# Patient Record
Sex: Female | Born: 1965 | Race: White | Hispanic: No | Smoking: Never smoker
Health system: Southern US, Community
[De-identification: ages and names within clinical notes are randomized; demographics above are authoritative.]

## PROBLEM LIST (undated history)

## (undated) DIAGNOSIS — K219 Gastro-esophageal reflux disease without esophagitis: Secondary | ICD-10-CM

## (undated) DIAGNOSIS — O24419 Gestational diabetes mellitus in pregnancy, unspecified control: Secondary | ICD-10-CM

## (undated) DIAGNOSIS — B009 Herpesviral infection, unspecified: Secondary | ICD-10-CM

## (undated) DIAGNOSIS — F329 Major depressive disorder, single episode, unspecified: Secondary | ICD-10-CM

## (undated) DIAGNOSIS — N200 Calculus of kidney: Secondary | ICD-10-CM

## (undated) DIAGNOSIS — F419 Anxiety disorder, unspecified: Secondary | ICD-10-CM

## (undated) DIAGNOSIS — F32A Depression, unspecified: Secondary | ICD-10-CM

## (undated) DIAGNOSIS — Z8619 Personal history of other infectious and parasitic diseases: Secondary | ICD-10-CM

## (undated) DIAGNOSIS — I2489 Other forms of acute ischemic heart disease: Secondary | ICD-10-CM

## (undated) DIAGNOSIS — I10 Essential (primary) hypertension: Secondary | ICD-10-CM

## (undated) DIAGNOSIS — F1490 Cocaine use, unspecified, uncomplicated: Secondary | ICD-10-CM

## (undated) DIAGNOSIS — E538 Deficiency of other specified B group vitamins: Secondary | ICD-10-CM

## (undated) DIAGNOSIS — Z8659 Personal history of other mental and behavioral disorders: Secondary | ICD-10-CM

## (undated) DIAGNOSIS — Z87442 Personal history of urinary calculi: Secondary | ICD-10-CM

## (undated) DIAGNOSIS — E785 Hyperlipidemia, unspecified: Secondary | ICD-10-CM

## (undated) DIAGNOSIS — F1010 Alcohol abuse, uncomplicated: Secondary | ICD-10-CM

## (undated) DIAGNOSIS — Z860100 Personal history of colon polyps, unspecified: Secondary | ICD-10-CM

## (undated) DIAGNOSIS — D649 Anemia, unspecified: Secondary | ICD-10-CM

## (undated) DIAGNOSIS — Z87898 Personal history of other specified conditions: Secondary | ICD-10-CM

## (undated) DIAGNOSIS — F1011 Alcohol abuse, in remission: Secondary | ICD-10-CM

## (undated) DIAGNOSIS — Z8489 Family history of other specified conditions: Secondary | ICD-10-CM

## (undated) DIAGNOSIS — R7989 Other specified abnormal findings of blood chemistry: Secondary | ICD-10-CM

## (undated) HISTORY — DX: Personal history of other specified conditions: Z87.898

## (undated) HISTORY — DX: Personal history of colonic polyps: Z86.010

## (undated) HISTORY — DX: Calculus of kidney: N20.0

## (undated) HISTORY — PX: DILATION AND CURETTAGE OF UTERUS: SHX78

## (undated) HISTORY — DX: Depression, unspecified: F32.A

## (undated) HISTORY — DX: Major depressive disorder, single episode, unspecified: F32.9

## (undated) HISTORY — DX: Alcohol abuse, in remission: F10.11

## (undated) HISTORY — DX: Essential (primary) hypertension: I10

## (undated) HISTORY — DX: Herpesviral infection, unspecified: B00.9

## (undated) HISTORY — DX: Hyperlipidemia, unspecified: E78.5

## (undated) HISTORY — DX: Personal history of other infectious and parasitic diseases: Z86.19

## (undated) HISTORY — DX: Other forms of acute ischemic heart disease: I24.89

## (undated) HISTORY — DX: Personal history of colon polyps, unspecified: Z86.0100

## (undated) HISTORY — DX: Personal history of other mental and behavioral disorders: Z86.59

---

## 1995-11-19 HISTORY — PX: TONSILLECTOMY: SUR1361

## 1995-11-19 HISTORY — PX: APPENDECTOMY: SHX54

## 1996-11-18 HISTORY — PX: ABDOMINAL HYSTERECTOMY: SHX81

## 2008-05-13 ENCOUNTER — Emergency Department: Payer: Self-pay | Admitting: Emergency Medicine

## 2009-05-01 ENCOUNTER — Emergency Department: Payer: Self-pay | Admitting: Emergency Medicine

## 2010-01-21 ENCOUNTER — Inpatient Hospital Stay: Payer: Self-pay | Admitting: Internal Medicine

## 2010-03-03 ENCOUNTER — Inpatient Hospital Stay: Payer: Self-pay | Admitting: Psychiatry

## 2010-04-11 ENCOUNTER — Inpatient Hospital Stay: Payer: Self-pay | Admitting: Internal Medicine

## 2010-05-25 ENCOUNTER — Inpatient Hospital Stay: Payer: Self-pay | Admitting: Psychiatry

## 2010-07-06 ENCOUNTER — Inpatient Hospital Stay: Payer: Self-pay | Admitting: Psychiatry

## 2010-08-21 IMAGING — CR RIGHT HAND - COMPLETE 3+ VIEW
1 series · 3 of 3 positions shown · non-contrast
Comparison: none

REASON FOR EXAM: PAINFUL AND SWOLLEN
COMMENTS:

[Series 1: view not recorded · 0.17mm/px · 3 of 3 slices shown]
[im 1/3]
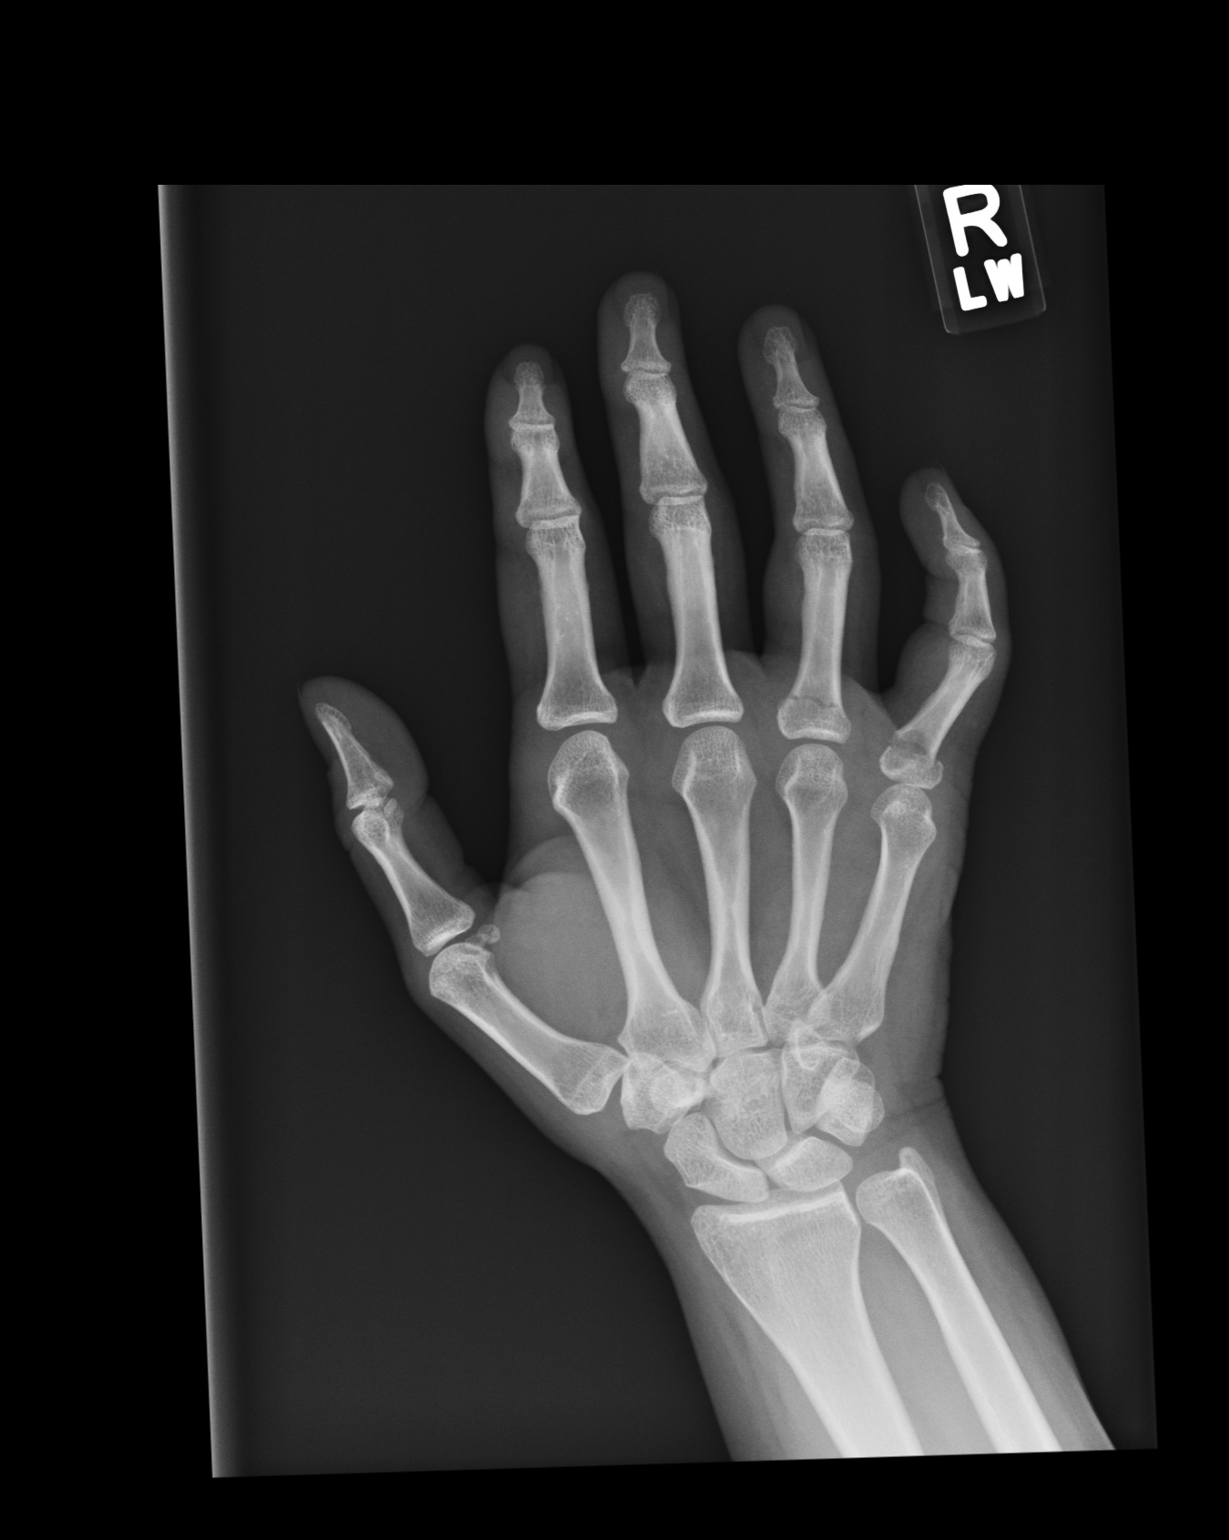
[im 2/3]
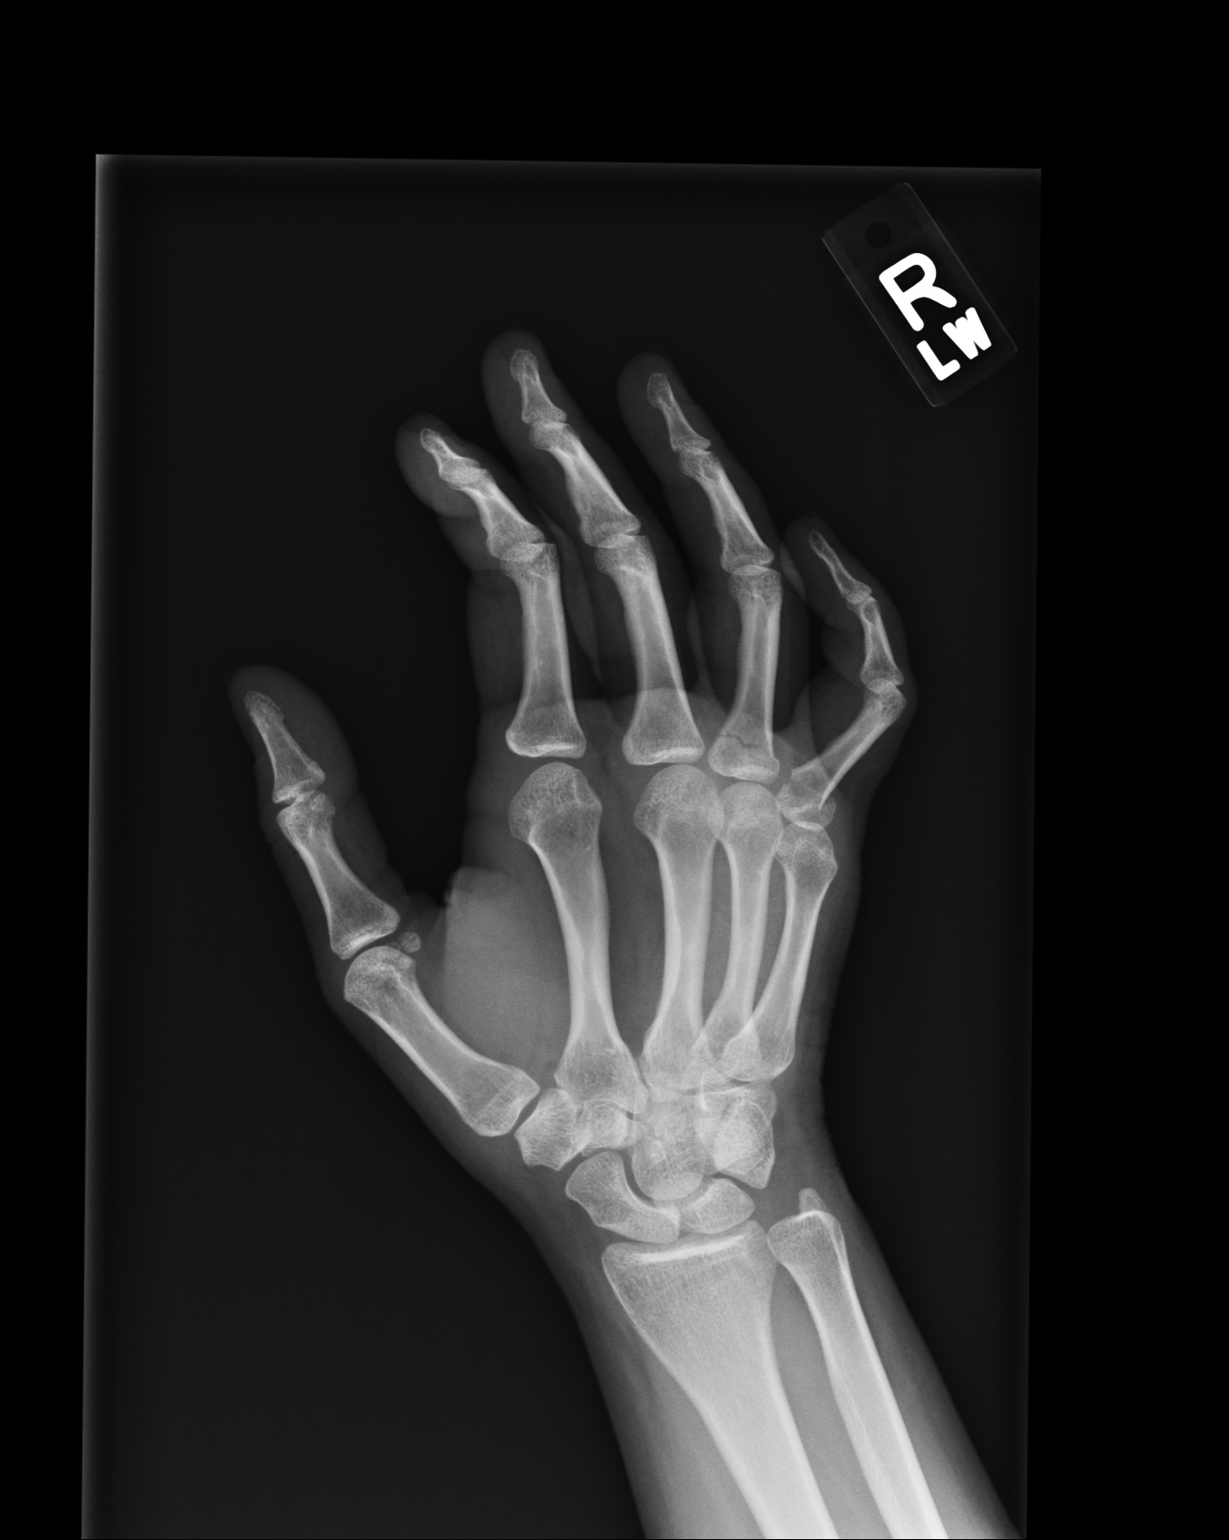
[im 3/3]
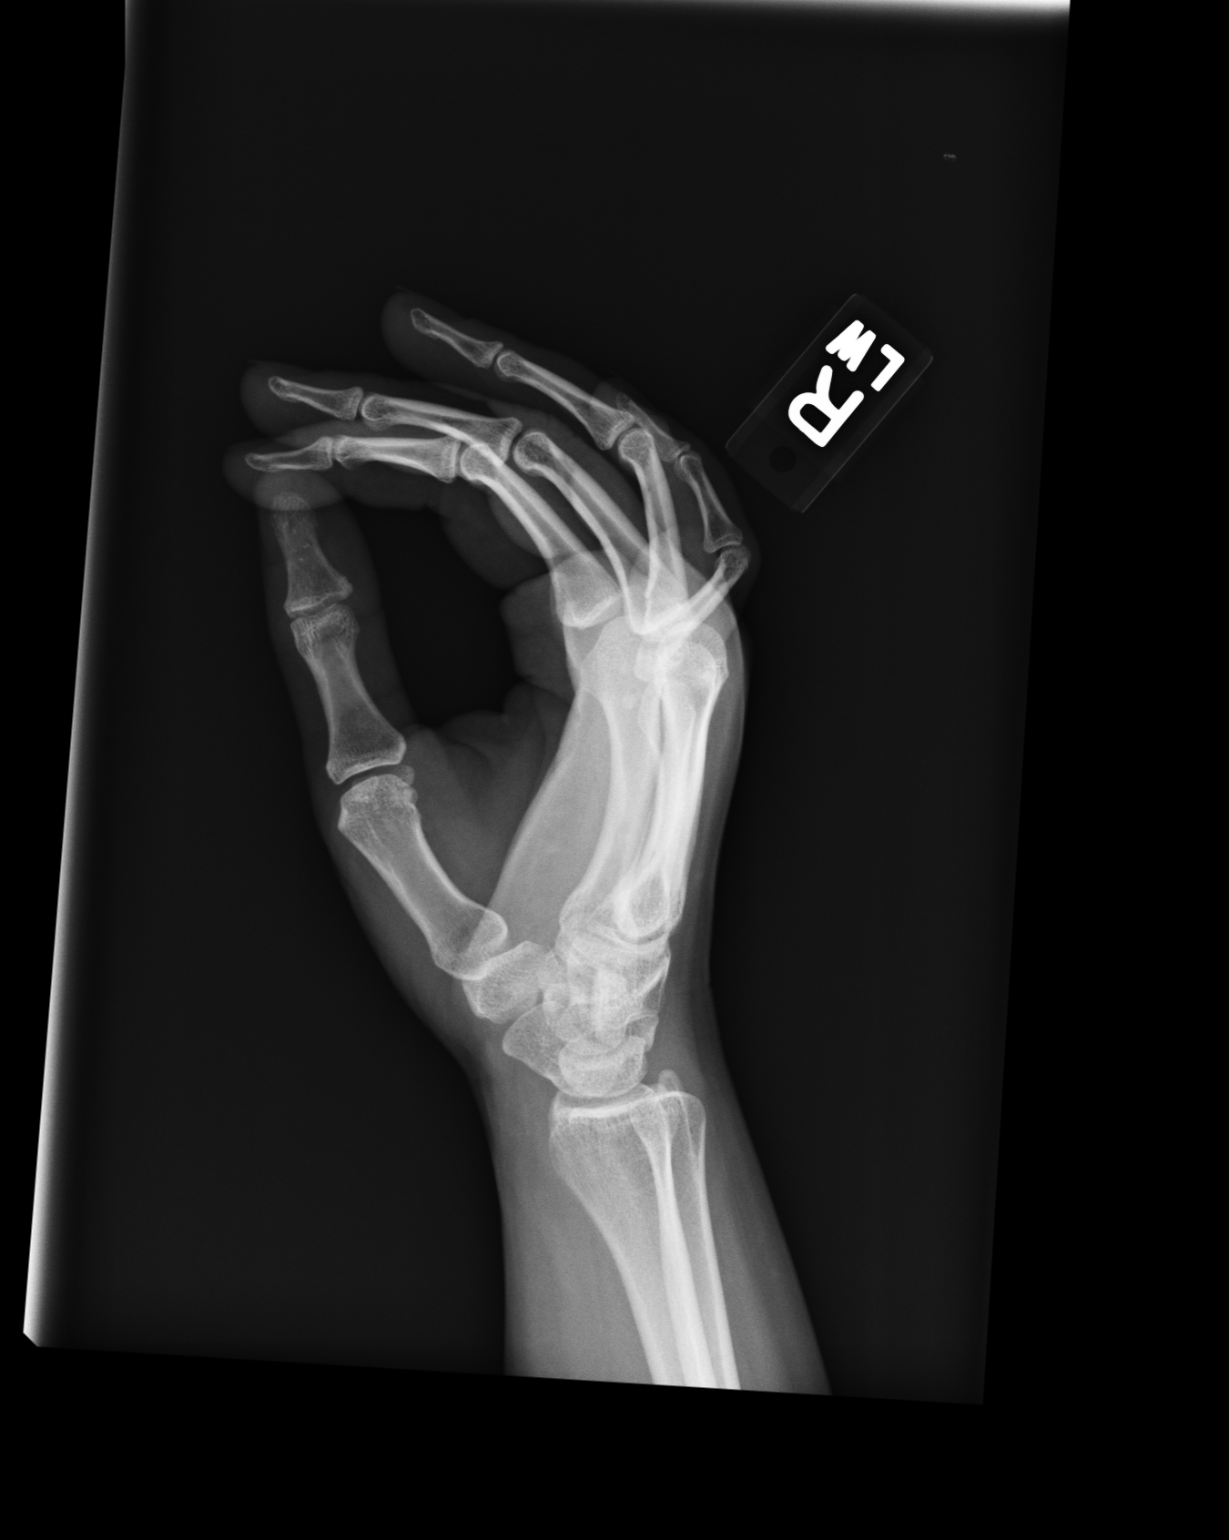

[3 of 3 positions shown; findings below may reference images not displayed]

PROCEDURE:     DXR - DXR HAND RT COMPLETE W/OBLIQUES  - May 02, 2009 [DATE]

RESULT:     There is a minimally displaced fracture of the proximal portion
of the proximal phalanx of the right ring finger.

There is a mildly displaced, angulated fracture of the proximal portion of
the proximal phalanx of the fifth finger. There is dorsal angulation of the
distal fracture component with respect to proximal. No additional fractures
are seen.
IMPRESSION: There are fractures of the proximal phalanges of the fourth and fifth
fingers as noted above.

## 2011-05-12 IMAGING — CR DG CHEST 1V PORT
1 series · 1 of 1 positions shown · non-contrast
Comparison: none

REASON FOR EXAM: UPRIGHT TO EVAL FREE AIR
COMMENTS:

[view not recorded]
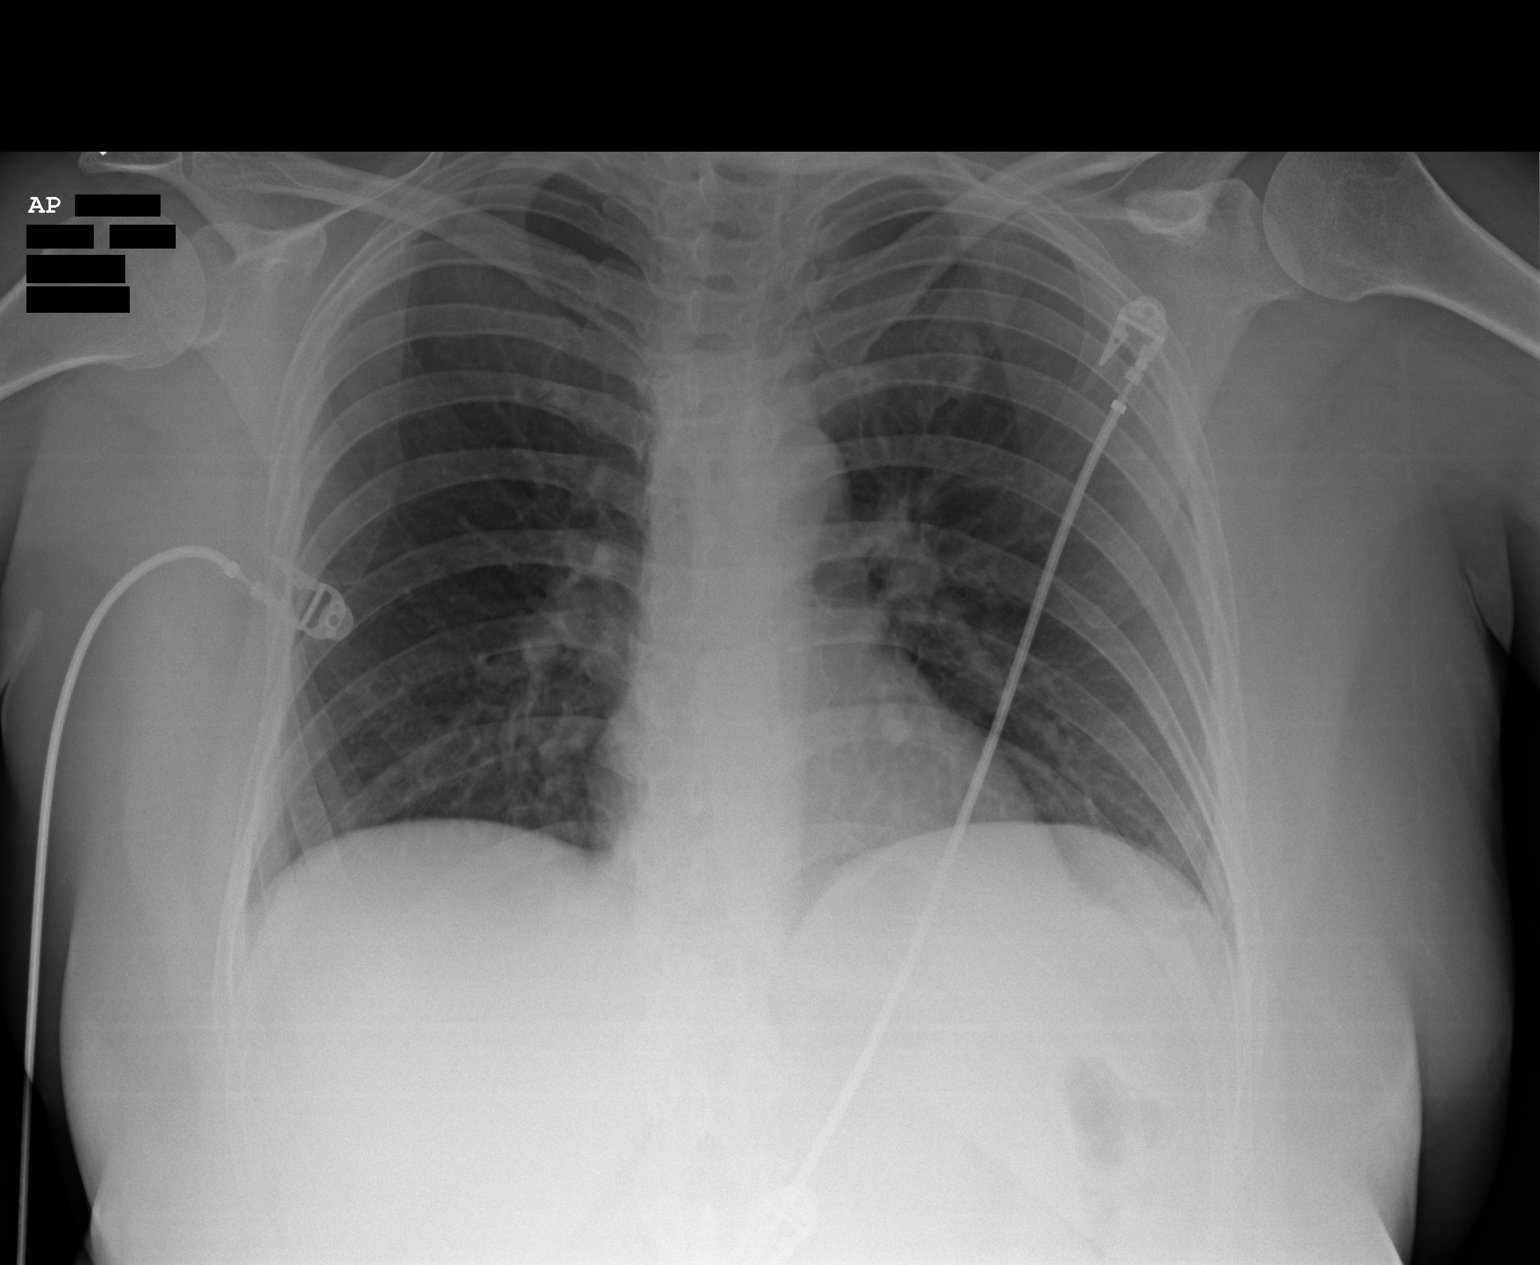

[1 of 1 positions shown; findings below may reference images not displayed]

PROCEDURE:     DXR - DXR PORTABLE CHEST SINGLE VIEW  - January 21, 2010  [DATE]

RESULT:     There is no previous exam for comparison.

The lungs are clear. The heart and pulmonary vessels are normal. The bony
and mediastinal structures are unremarkable. There is no effusion. There is
no pneumothorax or evidence of congestive failure.
IMPRESSION: No acute cardiopulmonary disease.

## 2011-07-28 ENCOUNTER — Emergency Department: Payer: Self-pay | Admitting: Emergency Medicine

## 2013-01-10 ENCOUNTER — Encounter (HOSPITAL_COMMUNITY): Payer: Self-pay | Admitting: Emergency Medicine

## 2013-01-10 ENCOUNTER — Emergency Department (HOSPITAL_COMMUNITY): Payer: BC Managed Care – PPO

## 2013-01-10 ENCOUNTER — Emergency Department (HOSPITAL_COMMUNITY)
Admission: EM | Admit: 2013-01-10 | Discharge: 2013-01-11 | Disposition: A | Discharge status: Home / Self Care | Payer: BC Managed Care – PPO | Attending: Emergency Medicine | Admitting: Emergency Medicine

## 2013-01-10 DIAGNOSIS — R079 Chest pain, unspecified: Secondary | ICD-10-CM | POA: Insufficient documentation

## 2013-01-10 DIAGNOSIS — R0602 Shortness of breath: Secondary | ICD-10-CM | POA: Insufficient documentation

## 2013-01-10 DIAGNOSIS — Z7982 Long term (current) use of aspirin: Secondary | ICD-10-CM | POA: Insufficient documentation

## 2013-01-10 LAB — POCT I-STAT TROPONIN I

## 2013-01-10 LAB — CBC
HCT: 41.3 % (ref 36.0–46.0)
Hemoglobin: 14.1 g/dL (ref 12.0–15.0)
MCH: 30.7 pg (ref 26.0–34.0)
MCHC: 34.1 g/dL (ref 30.0–36.0)
RDW: 13.4 % (ref 11.5–15.5)

## 2013-01-10 LAB — BASIC METABOLIC PANEL
BUN: 18 mg/dL (ref 6–23)
Chloride: 103 mEq/L (ref 96–112)
GFR calc Af Amer: >90 mL/min (ref >90)
Potassium: 3.9 mEq/L (ref 3.5–5.1)

## 2013-01-10 MED ORDER — ONDANSETRON HCL 4 MG/2ML IJ SOLN
4.0000 mg | Freq: Once | INTRAMUSCULAR | Status: AC
  Administered 2013-01-11: 4 mg via INTRAVENOUS
  Filled 2013-01-10: qty 2

## 2013-01-10 MED ORDER — NITROGLYCERIN 0.4 MG SL SUBL
0.4000 mg | SUBLINGUAL_TABLET | SUBLINGUAL | Status: DC | PRN
  Administered 2013-01-11: 0.4 mg via SUBLINGUAL
  Filled 2013-01-10: qty 25

## 2013-01-10 MED ORDER — MORPHINE SULFATE 4 MG/ML IJ SOLN
4.0000 mg | Freq: Once | INTRAMUSCULAR | Status: AC
  Administered 2013-01-11: 4 mg via INTRAVENOUS
  Filled 2013-01-10: qty 1
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
## 2013-01-10 NOTE — ED Notes (Signed)
Patient states she has taken 2 325mg  of ASA today.

## 2013-01-10 NOTE — ED Notes (Signed)
Patient with chest pain that started yesterday and increased in pain today.  Patient states in the last hour she has noticed some shortness of breath.

## 2013-01-11 MED ORDER — FAMOTIDINE 20 MG PO TABS
20.0000 mg | ORAL_TABLET | Freq: Once | ORAL | Status: AC
  Administered 2013-01-11: 20 mg via ORAL
  Filled 2013-01-11: qty 1

## 2013-01-11 MED ORDER — HYDROMORPHONE HCL PF 1 MG/ML IJ SOLN
1.0000 mg | Freq: Once | INTRAMUSCULAR | Status: AC
  Administered 2013-01-11: 1 mg via INTRAVENOUS
  Filled 2013-01-11: qty 1

## 2013-01-11 MED ORDER — PANTOPRAZOLE SODIUM 40 MG IV SOLR
40.0000 mg | Freq: Once | INTRAVENOUS | Status: AC
  Administered 2013-01-11: 40 mg via INTRAVENOUS
  Filled 2013-01-11: qty 40

## 2013-01-11 MED ORDER — GI COCKTAIL ~~LOC~~
30.0000 mL | Freq: Once | ORAL | Status: AC
  Administered 2013-01-11: 30 mL via ORAL
  Filled 2013-01-11: qty 30

## 2013-01-11 NOTE — ED Notes (Signed)
IV attempt unsuccessful. 2nd RN to assess, also unsuccessful. Patient has been stuck multiple times which includes phlebotomy for labs. IV RN phoned to assess.

## 2013-08-30 ENCOUNTER — Ambulatory Visit (INDEPENDENT_AMBULATORY_CARE_PROVIDER_SITE_OTHER): Payer: BC Managed Care – PPO | Admitting: General Surgery

## 2013-08-30 ENCOUNTER — Encounter: Payer: Self-pay | Admitting: General Surgery

## 2013-08-30 ENCOUNTER — Other Ambulatory Visit: Payer: BC Managed Care – PPO

## 2013-08-30 VITALS — BP 128/80 | HR 78 | Resp 12 | Ht 64.0 in | Wt 190.0 lb

## 2013-08-30 DIAGNOSIS — N644 Mastodynia: Secondary | ICD-10-CM

## 2013-08-30 NOTE — Patient Instructions (Addendum)
Patient to have a mammogram. This patient is to return in 6 weeks for recheck

## 2013-08-30 NOTE — Progress Notes (Signed)
Patient ID: Tracy Phillips, female   DOB: [birth year], 47 y.o.   MRN: [id number]  Chief Complaint  Patient presents with  . Other    breast mass    HPI Tracy Phillips is a 47 y.o. female that presents today for R upper intermittent breast pain x 1 month. Patient describes pain as dull and sometimes "tugging." Admits to pain radiating from upper R lobe to R axillary region. She states pain can be reproduced when she presses down on the area. Denies any pain in the L breast. She has felt a mass for the "past couple of months" and states she has been told by her Melrose Nakayama that she has fibrocystic breast changes. Patient denies aggravating or relieving factors. She has never had breast pain prior to this past month. No personal or family history of breast cancer.  HPI  History reviewed. No pertinent past medical history.  Past Surgical History  Procedure Laterality Date  . Abdominal hysterectomy    . Appendectomy    . Tonsillectomy    . Dilation and curettage of uterus      History reviewed. No pertinent family history.  Social History History  Substance Use Topics  . Smoking status: Never Smoker   . Smokeless tobacco: Never Used  . Alcohol Use: No    Allergies  Allergen Reactions  . Aleve [Naproxen Sodium] Hives    Current Outpatient Prescriptions  Medication Sig Dispense Refill  . aspirin 325 MG tablet Take 325 mg by mouth daily.       No current facility-administered medications for this visit.    Review of Systems Review of Systems  Constitutional: Negative.   Respiratory: Negative.   Cardiovascular: Negative.     Blood pressure 128/80, pulse 78, resp. rate 12, height 5\' 4"  (1.626 m), weight 190 lb (86.183 kg).  Physical Exam Physical Exam  Constitutional: She is oriented to person, place, and time. She appears well-developed and well-nourished.  Eyes: Conjunctivae are normal. No scleral icterus.  Neck: Neck supple.  Cardiovascular: Normal rate, regular rhythm and  normal heart sounds.   Pulmonary/Chest: Effort normal and breath sounds normal. Right breast exhibits no inverted nipple, no mass, no nipple discharge, no skin change and no tenderness. Left breast exhibits no inverted nipple, no mass, no nipple discharge, no skin change and no tenderness.    Abdominal: Soft. Normal appearance and bowel sounds are normal. There is no tenderness.  Lymphadenopathy:    She has no cervical adenopathy.    She has no axillary adenopathy.  Neurological: She is alert and oriented to person, place, and time.  Skin: Skin is warm and dry.    Data Reviewed none  Assessment   Performed right breast ultrasound. No findings noted.  Right breast pain with no findings to account for it.       Plan    Advised use of Advil or Aleve for the pain.     Patient has been scheduled for a bilateral screening mammogram at [facility] for 09-09-13 at 8:15 am. She is aware of date, time, and instructions.   This patient will follow up in the office in 6 weeks.    Thresa Dozier G 08/30/2013, 12:22 PM

## 2013-09-10 ENCOUNTER — Ambulatory Visit: Payer: Self-pay | Admitting: General Surgery

## 2013-10-11 ENCOUNTER — Encounter: Payer: Self-pay | Admitting: General Surgery

## 2013-10-11 ENCOUNTER — Ambulatory Visit (INDEPENDENT_AMBULATORY_CARE_PROVIDER_SITE_OTHER): Payer: BC Managed Care – PPO | Admitting: General Surgery

## 2013-10-11 VITALS — BP 120/78 | HR 76 | Resp 12 | Ht 64.0 in | Wt 186.0 lb

## 2013-10-11 DIAGNOSIS — N644 Mastodynia: Secondary | ICD-10-CM

## 2013-10-11 NOTE — Progress Notes (Signed)
Patient ID: Tracy Phillips, female   DOB: 12/04/1965, 47 y.o.   MRN: [id number]  Chief Complaint  Patient presents with  . Other    breast pain    HPI Tracy Phillips is a 47 y.o. female female that presents today for R upper intermittent breast pain x 2 month. Patient describes pain as dull and sometimes "tugging." Admits to pain radiating from upper R lobe to R axillary region. She states pain can be reproduced when she presses down on the area. Denies any pain in the L breast. She has felt a mass for the "past couple of months" and states she has been told by her Tracy Phillips that she has fibrocystic breast changes. Patient denies aggravating or relieving factors. She has never had breast pain prior to this past 2 month. No personal or family history of breast cancer. Pt had normal [place] of right breast and a normal mammogram last month.  HPI  History reviewed. No pertinent past medical history.  Past Surgical History  Procedure Laterality Date  . Abdominal hysterectomy    . Appendectomy    . Tonsillectomy    . Dilation and curettage of uterus      History reviewed. No pertinent family history.  Social History History  Substance Use Topics  . Smoking status: Never Smoker   . Smokeless tobacco: Never Used  . Alcohol Use: No    Allergies  Allergen Reactions  . Aleve [Naproxen Sodium] Hives    Current Outpatient Prescriptions  Medication Sig Dispense Refill  . aspirin 325 MG tablet Take 325 mg by mouth daily.       No current facility-administered medications for this visit.    Review of Systems Review of Systems  Constitutional: Negative.   Respiratory: Negative.   Cardiovascular: Negative.     Blood pressure 120/78, pulse 76, resp. rate 12, height 5\' 4"  (1.626 m), weight 186 lb (84.369 kg).  Physical Exam Physical Exam  Constitutional: She is oriented to person, place, and time. She appears well-developed and well-nourished.  Eyes: Conjunctivae are normal. No scleral  icterus.  Neck: No thyromegaly present.  Pulmonary/Chest: Right breast exhibits tenderness. Right breast exhibits no inverted nipple, no mass, no nipple discharge and no skin change. Left breast exhibits no inverted nipple, no mass, no nipple discharge and no skin change. Breasts are symmetrical.  Patient points to the superior portion of the right breast where she feels the pain. It is normal on exam.  Lymphadenopathy:    She has no cervical adenopathy.    She has no axillary adenopathy.  Neurological: She is alert and oriented to person, place, and time.  Skin: Skin is warm and dry.    Data Reviewed Mammogram done 09/09/13 was normal.  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 The pain lasts for a couple of days. The last episode being about 5 weeks ago.     Plan    Patient advised to use Aleve for breast pain prn and also a small supplement of Vitamin E.       SANKAR,SEEPLAPUTHUR G 10/11/2013, 11:36 AM

## 2013-10-11 NOTE — Patient Instructions (Addendum)
Patient advised to use Aleve for breast pain prn and also a small supplement of Vitamin E. Continue with self breast exam monthly.

## 2013-11-05 ENCOUNTER — Ambulatory Visit (INDEPENDENT_AMBULATORY_CARE_PROVIDER_SITE_OTHER): Payer: BC Managed Care – PPO | Admitting: Internal Medicine

## 2013-11-05 ENCOUNTER — Encounter: Payer: Self-pay | Admitting: Internal Medicine

## 2013-11-05 ENCOUNTER — Encounter (INDEPENDENT_AMBULATORY_CARE_PROVIDER_SITE_OTHER): Payer: Self-pay

## 2013-11-05 VITALS — BP 120/80 | HR 92 | Temp 98.6°F | Ht 63.0 in | Wt 186.5 lb

## 2013-11-05 DIAGNOSIS — B009 Herpesviral infection, unspecified: Secondary | ICD-10-CM

## 2013-11-05 DIAGNOSIS — F3289 Other specified depressive episodes: Secondary | ICD-10-CM

## 2013-11-05 DIAGNOSIS — N200 Calculus of kidney: Secondary | ICD-10-CM

## 2013-11-05 DIAGNOSIS — F329 Major depressive disorder, single episode, unspecified: Secondary | ICD-10-CM

## 2013-11-05 DIAGNOSIS — F1010 Alcohol abuse, uncomplicated: Secondary | ICD-10-CM

## 2013-11-05 DIAGNOSIS — Z86010 Personal history of colonic polyps: Secondary | ICD-10-CM

## 2013-11-05 DIAGNOSIS — E7800 Pure hypercholesterolemia, unspecified: Secondary | ICD-10-CM

## 2013-11-05 DIAGNOSIS — R51 Headache: Secondary | ICD-10-CM

## 2013-11-05 DIAGNOSIS — F32A Depression, unspecified: Secondary | ICD-10-CM

## 2013-11-05 MED ORDER — ACYCLOVIR 400 MG PO TABS: 400.0000 mg | ORAL_TABLET | Freq: Two times a day (BID) | ORAL | Status: DC

## 2013-11-05 NOTE — Progress Notes (Signed)
Pre-visit discussion using our clinic review tool. No additional management support is needed unless otherwise documented below in the visit note.  

## 2013-11-07 ENCOUNTER — Encounter: Payer: Self-pay | Admitting: Internal Medicine

## 2013-11-07 DIAGNOSIS — E7800 Pure hypercholesterolemia, unspecified: Secondary | ICD-10-CM | POA: Insufficient documentation

## 2013-11-07 DIAGNOSIS — F329 Major depressive disorder, single episode, unspecified: Secondary | ICD-10-CM | POA: Insufficient documentation

## 2013-11-07 DIAGNOSIS — Z860100 Personal history of colon polyps, unspecified: Secondary | ICD-10-CM | POA: Insufficient documentation

## 2013-11-07 DIAGNOSIS — R51 Headache: Secondary | ICD-10-CM | POA: Insufficient documentation

## 2013-11-07 DIAGNOSIS — F32A Depression, unspecified: Secondary | ICD-10-CM | POA: Insufficient documentation

## 2013-11-07 DIAGNOSIS — B009 Herpesviral infection, unspecified: Secondary | ICD-10-CM | POA: Insufficient documentation

## 2013-11-07 DIAGNOSIS — F1010 Alcohol abuse, uncomplicated: Secondary | ICD-10-CM | POA: Insufficient documentation

## 2013-11-07 DIAGNOSIS — R519 Headache, unspecified: Secondary | ICD-10-CM | POA: Insufficient documentation

## 2013-11-07 DIAGNOSIS — N200 Calculus of kidney: Secondary | ICD-10-CM | POA: Insufficient documentation

## 2013-11-07 NOTE — Assessment & Plan Note (Signed)
Low cholesterol diet and exercise.  Follow lipid panel.   

## 2013-11-07 NOTE — Assessment & Plan Note (Signed)
Not an issue with decreased stress.  Follow.

## 2013-11-07 NOTE — Assessment & Plan Note (Signed)
Has had a history of reoccurring kidney stones.  Follow.  Currently asymptomatic.
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
## 2013-11-07 NOTE — Assessment & Plan Note (Signed)
States up to date with colonoscopy.  Obtain records.

## 2013-11-07 NOTE — Assessment & Plan Note (Signed)
Has not had a drink of alcohol for three years.  Doing well.  Follow.

## 2013-11-07 NOTE — Assessment & Plan Note (Signed)
Not an issue now.  Has not been an issue since she has stopped drinking.  Follow.

## 2013-11-07 NOTE — Assessment & Plan Note (Signed)
Occasional flares.  No lesions now.  Follow.

## 2013-11-16 ENCOUNTER — Other Ambulatory Visit: Payer: BC Managed Care – PPO

## 2013-11-24 ENCOUNTER — Other Ambulatory Visit (INDEPENDENT_AMBULATORY_CARE_PROVIDER_SITE_OTHER): Payer: BC Managed Care – PPO

## 2013-11-24 DIAGNOSIS — N200 Calculus of kidney: Secondary | ICD-10-CM

## 2013-11-24 DIAGNOSIS — F32A Depression, unspecified: Secondary | ICD-10-CM

## 2013-11-24 DIAGNOSIS — F329 Major depressive disorder, single episode, unspecified: Secondary | ICD-10-CM

## 2013-11-24 DIAGNOSIS — E7800 Pure hypercholesterolemia, unspecified: Secondary | ICD-10-CM

## 2013-11-24 DIAGNOSIS — F3289 Other specified depressive episodes: Secondary | ICD-10-CM

## 2013-11-24 LAB — COMPREHENSIVE METABOLIC PANEL
ALT: 15 U/L (ref 0–35)
AST: 16 U/L (ref 0–37)
Albumin: 4.3 g/dL (ref 3.5–5.2)
Alkaline Phosphatase: 40 U/L (ref 39–117)
BUN: 14 mg/dL (ref 6–23)
CO2: 24 mEq/L (ref 19–32)
Calcium: 9 mg/dL (ref 8.4–10.5)
Chloride: 105 mEq/L (ref 96–112)
Creatinine, Ser: 0.8 mg/dL (ref 0.4–1.2)
GFR: 84.82 mL/min (ref >60.00)
Glucose, Bld: 106 mg/dL — ABNORMAL HIGH (ref 70–99)
Potassium: 4 mEq/L (ref 3.5–5.1)
Sodium: 139 mEq/L (ref 135–145)
Total Bilirubin: 0.7 mg/dL (ref 0.3–1.2)
Total Protein: 7.3 g/dL (ref 6.0–8.3)

## 2013-11-24 LAB — CBC WITH DIFFERENTIAL/PLATELET
Basophils Absolute: 0 10*3/uL (ref 0.0–0.1)
Basophils Relative: 0.4 % (ref 0.0–3.0)
Eosinophils Absolute: 0.1 10*3/uL (ref 0.0–0.7)
Eosinophils Relative: 0.9 % (ref 0.0–5.0)
HCT: 39.8 % (ref 36.0–46.0)
Hemoglobin: 13.5 g/dL (ref 12.0–15.0)
Lymphocytes Relative: 23.6 % (ref 12.0–46.0)
Lymphs Abs: 2.1 10*3/uL (ref 0.7–4.0)
MCHC: 33.9 g/dL (ref 30.0–36.0)
MCV: 91.1 fl (ref 78.0–100.0)
Monocytes Absolute: 0.5 10*3/uL (ref 0.1–1.0)
Monocytes Relative: 6.2 % (ref 3.0–12.0)
Neutro Abs: 6.1 10*3/uL (ref 1.4–7.7)
Neutrophils Relative %: 68.9 % (ref 43.0–77.0)
Platelets: 211 10*3/uL (ref 150.0–400.0)
RBC: 4.37 Mil/uL (ref 3.87–5.11)
RDW: 14 % (ref 11.5–14.6)
WBC: 8.8 10*3/uL (ref 4.5–10.5)

## 2013-11-24 LAB — LIPID PANEL
Cholesterol: 253 mg/dL — ABNORMAL HIGH (ref 0–200)
HDL: 36.8 mg/dL — ABNORMAL LOW (ref >39.00)
Total CHOL/HDL Ratio: 7
Triglycerides: 211 mg/dL — ABNORMAL HIGH (ref 0.0–149.0)
VLDL: 42.2 mg/dL — AB (ref 0.0–40.0)

## 2013-11-24 LAB — LDL CHOLESTEROL, DIRECT: LDL DIRECT: 177.8 mg/dL

## 2013-11-24 LAB — TSH: TSH: 1.41 u[IU]/mL (ref 0.35–5.50)

## 2013-11-25 ENCOUNTER — Other Ambulatory Visit: Payer: Self-pay | Admitting: Internal Medicine

## 2013-11-25 DIAGNOSIS — R739 Hyperglycemia, unspecified: Secondary | ICD-10-CM

## 2013-11-25 NOTE — Progress Notes (Signed)
Orders placed for f/u labs.  

## 2013-12-07 ENCOUNTER — Other Ambulatory Visit (INDEPENDENT_AMBULATORY_CARE_PROVIDER_SITE_OTHER): Payer: BC Managed Care – PPO

## 2013-12-07 DIAGNOSIS — R739 Hyperglycemia, unspecified: Secondary | ICD-10-CM

## 2013-12-07 DIAGNOSIS — R7309 Other abnormal glucose: Secondary | ICD-10-CM

## 2013-12-07 LAB — HEMOGLOBIN A1C: HEMOGLOBIN A1C: 6 % (ref 4.6–6.5)

## 2013-12-08 LAB — GLUCOSE, FASTING: GLUCOSE, FASTING: 87 mg/dL (ref 70–99)

## 2013-12-22 ENCOUNTER — Telehealth: Payer: Self-pay | Admitting: *Deleted

## 2013-12-22 NOTE — Telephone Encounter (Signed)
She has already come in for her glucose and a1c. She had this on 12/07/13. She does not need to come in for labs tomorrow.  Please contact her - no need to come in tomorrow.    Thanks.

## 2013-12-22 NOTE — Telephone Encounter (Signed)
Pt is coming in for labs tomorrow 02.05.2015 what labs and dx?

## 2013-12-23 ENCOUNTER — Other Ambulatory Visit: Payer: BC Managed Care – PPO

## 2014-01-26 ENCOUNTER — Encounter: Payer: BC Managed Care – PPO | Admitting: Internal Medicine

## 2014-03-11 ENCOUNTER — Encounter: Payer: BC Managed Care – PPO | Admitting: Internal Medicine

## 2014-09-19 ENCOUNTER — Encounter: Payer: Self-pay | Admitting: Internal Medicine

## 2014-09-20 ENCOUNTER — Ambulatory Visit: Payer: BC Managed Care – PPO | Admitting: General Surgery

## 2014-10-06 ENCOUNTER — Telehealth: Payer: Self-pay

## 2014-10-06 ENCOUNTER — Encounter: Payer: Self-pay | Admitting: *Deleted

## 2014-10-06 MED ORDER — ACYCLOVIR 400 MG PO TABS: 400.0000 mg | ORAL_TABLET | Freq: Two times a day (BID) | ORAL | Status: DC

## 2014-10-06 NOTE — Addendum Note (Signed)
Addended by: Melene PlanABDUL-MUTAKALLIM, TONIKA C on: 10/06/2014 11:45 AM   Modules accepted: Orders

## 2014-10-06 NOTE — Telephone Encounter (Signed)
I am ok to refill her acyclovir x 1, but she needs to be scheduled for a physical.   Please schedule.

## 2014-10-06 NOTE — Telephone Encounter (Signed)
Patient called requesting a refill on her Rx for acyclovir 400mg . Patient stated that she is currently having a herpes outbreak. Patient's last office visit was 11/05/13. Please advise

## 2014-10-06 NOTE — Telephone Encounter (Signed)
Called and notified patient the Rx was sent to pharmacy for a month supply. Informed patient that she needed to schedule a physical with Dr. Lorin PicketScott. Patient stated that she would have to call back to schedule appointment after she checks her calendar.

## 2016-11-08 DIAGNOSIS — A6009 Herpesviral infection of other urogenital tract: Secondary | ICD-10-CM | POA: Diagnosis not present

## 2016-11-08 DIAGNOSIS — F4329 Adjustment disorder with other symptoms: Secondary | ICD-10-CM | POA: Diagnosis not present

## 2017-06-03 ENCOUNTER — Telehealth: Payer: Self-pay | Admitting: Internal Medicine

## 2017-06-03 NOTE — Telephone Encounter (Signed)
Pt called to make an appt for a physical. Pt's last appt was on 03/11/14. Pt states that her mom is [employer] Corporation[employer]ec[employer] Lyons.

## 2017-06-03 NOTE — Telephone Encounter (Signed)
Ok for her to reestablish care.  Please let her know that it may be a while before appt.

## 2017-06-03 NOTE — Telephone Encounter (Signed)
Ok to make app for patient? I have called patient and let her know that you will be out of the office and we will call her as soon as we have response.

## 2017-06-04 NOTE — Telephone Encounter (Signed)
Lm to re-est

## 2017-06-05 ENCOUNTER — Ambulatory Visit: Payer: Self-pay | Admitting: Family

## 2017-06-05 ENCOUNTER — Telehealth: Payer: Self-pay | Admitting: Internal Medicine

## 2017-06-05 NOTE — Telephone Encounter (Signed)
FYI

## 2017-06-05 NOTE — Telephone Encounter (Signed)
FYI - pt cancelled appt has a work conflict

## 2017-06-25 DIAGNOSIS — M542 Cervicalgia: Secondary | ICD-10-CM | POA: Diagnosis not present

## 2017-06-25 DIAGNOSIS — R293 Abnormal posture: Secondary | ICD-10-CM | POA: Diagnosis not present

## 2017-06-25 DIAGNOSIS — M9901 Segmental and somatic dysfunction of cervical region: Secondary | ICD-10-CM | POA: Diagnosis not present

## 2017-06-25 DIAGNOSIS — M2560 Stiffness of unspecified joint, not elsewhere classified: Secondary | ICD-10-CM | POA: Diagnosis not present

## 2017-07-11 DIAGNOSIS — M2560 Stiffness of unspecified joint, not elsewhere classified: Secondary | ICD-10-CM | POA: Diagnosis not present

## 2017-07-11 DIAGNOSIS — M9901 Segmental and somatic dysfunction of cervical region: Secondary | ICD-10-CM | POA: Diagnosis not present

## 2017-07-11 DIAGNOSIS — M542 Cervicalgia: Secondary | ICD-10-CM | POA: Diagnosis not present

## 2017-07-11 DIAGNOSIS — R293 Abnormal posture: Secondary | ICD-10-CM | POA: Diagnosis not present

## 2017-07-16 DIAGNOSIS — M9901 Segmental and somatic dysfunction of cervical region: Secondary | ICD-10-CM | POA: Diagnosis not present

## 2017-07-16 DIAGNOSIS — M542 Cervicalgia: Secondary | ICD-10-CM | POA: Diagnosis not present

## 2017-07-16 DIAGNOSIS — R293 Abnormal posture: Secondary | ICD-10-CM | POA: Diagnosis not present

## 2017-07-16 DIAGNOSIS — M2560 Stiffness of unspecified joint, not elsewhere classified: Secondary | ICD-10-CM | POA: Diagnosis not present

## 2017-07-18 DIAGNOSIS — M9901 Segmental and somatic dysfunction of cervical region: Secondary | ICD-10-CM | POA: Diagnosis not present

## 2017-07-18 DIAGNOSIS — M2560 Stiffness of unspecified joint, not elsewhere classified: Secondary | ICD-10-CM | POA: Diagnosis not present

## 2017-07-18 DIAGNOSIS — R293 Abnormal posture: Secondary | ICD-10-CM | POA: Diagnosis not present

## 2017-07-18 DIAGNOSIS — M542 Cervicalgia: Secondary | ICD-10-CM | POA: Diagnosis not present

## 2017-07-22 DIAGNOSIS — M9901 Segmental and somatic dysfunction of cervical region: Secondary | ICD-10-CM | POA: Diagnosis not present

## 2017-07-22 DIAGNOSIS — R293 Abnormal posture: Secondary | ICD-10-CM | POA: Diagnosis not present

## 2017-07-22 DIAGNOSIS — M2560 Stiffness of unspecified joint, not elsewhere classified: Secondary | ICD-10-CM | POA: Diagnosis not present

## 2017-07-22 DIAGNOSIS — M542 Cervicalgia: Secondary | ICD-10-CM | POA: Diagnosis not present

## 2017-07-25 DIAGNOSIS — R293 Abnormal posture: Secondary | ICD-10-CM | POA: Diagnosis not present

## 2017-07-25 DIAGNOSIS — M542 Cervicalgia: Secondary | ICD-10-CM | POA: Diagnosis not present

## 2017-07-25 DIAGNOSIS — M2560 Stiffness of unspecified joint, not elsewhere classified: Secondary | ICD-10-CM | POA: Diagnosis not present

## 2017-07-25 DIAGNOSIS — M9901 Segmental and somatic dysfunction of cervical region: Secondary | ICD-10-CM | POA: Diagnosis not present

## 2017-07-30 DIAGNOSIS — M9901 Segmental and somatic dysfunction of cervical region: Secondary | ICD-10-CM | POA: Diagnosis not present

## 2017-07-30 DIAGNOSIS — R293 Abnormal posture: Secondary | ICD-10-CM | POA: Diagnosis not present

## 2017-07-30 DIAGNOSIS — M2560 Stiffness of unspecified joint, not elsewhere classified: Secondary | ICD-10-CM | POA: Diagnosis not present

## 2017-07-30 DIAGNOSIS — M542 Cervicalgia: Secondary | ICD-10-CM | POA: Diagnosis not present

## 2017-08-06 DIAGNOSIS — M2560 Stiffness of unspecified joint, not elsewhere classified: Secondary | ICD-10-CM | POA: Diagnosis not present

## 2017-08-06 DIAGNOSIS — M9901 Segmental and somatic dysfunction of cervical region: Secondary | ICD-10-CM | POA: Diagnosis not present

## 2017-08-06 DIAGNOSIS — M542 Cervicalgia: Secondary | ICD-10-CM | POA: Diagnosis not present

## 2017-08-06 DIAGNOSIS — R293 Abnormal posture: Secondary | ICD-10-CM | POA: Diagnosis not present

## 2017-08-13 DIAGNOSIS — M9901 Segmental and somatic dysfunction of cervical region: Secondary | ICD-10-CM | POA: Diagnosis not present

## 2017-08-13 DIAGNOSIS — M542 Cervicalgia: Secondary | ICD-10-CM | POA: Diagnosis not present

## 2017-08-13 DIAGNOSIS — M2560 Stiffness of unspecified joint, not elsewhere classified: Secondary | ICD-10-CM | POA: Diagnosis not present

## 2017-08-13 DIAGNOSIS — R293 Abnormal posture: Secondary | ICD-10-CM | POA: Diagnosis not present

## 2017-08-20 DIAGNOSIS — M542 Cervicalgia: Secondary | ICD-10-CM | POA: Diagnosis not present

## 2017-08-20 DIAGNOSIS — R293 Abnormal posture: Secondary | ICD-10-CM | POA: Diagnosis not present

## 2017-08-20 DIAGNOSIS — M9901 Segmental and somatic dysfunction of cervical region: Secondary | ICD-10-CM | POA: Diagnosis not present

## 2017-08-20 DIAGNOSIS — M2560 Stiffness of unspecified joint, not elsewhere classified: Secondary | ICD-10-CM | POA: Diagnosis not present

## 2017-08-22 DIAGNOSIS — R293 Abnormal posture: Secondary | ICD-10-CM | POA: Diagnosis not present

## 2017-08-22 DIAGNOSIS — M542 Cervicalgia: Secondary | ICD-10-CM | POA: Diagnosis not present

## 2017-08-22 DIAGNOSIS — M9901 Segmental and somatic dysfunction of cervical region: Secondary | ICD-10-CM | POA: Diagnosis not present

## 2017-08-22 DIAGNOSIS — M2560 Stiffness of unspecified joint, not elsewhere classified: Secondary | ICD-10-CM | POA: Diagnosis not present

## 2017-08-27 DIAGNOSIS — R293 Abnormal posture: Secondary | ICD-10-CM | POA: Diagnosis not present

## 2017-08-27 DIAGNOSIS — M2560 Stiffness of unspecified joint, not elsewhere classified: Secondary | ICD-10-CM | POA: Diagnosis not present

## 2017-08-27 DIAGNOSIS — M9901 Segmental and somatic dysfunction of cervical region: Secondary | ICD-10-CM | POA: Diagnosis not present

## 2017-08-27 DIAGNOSIS — M542 Cervicalgia: Secondary | ICD-10-CM | POA: Diagnosis not present

## 2017-08-29 ENCOUNTER — Ambulatory Visit: Payer: BLUE CROSS/BLUE SHIELD | Admitting: Internal Medicine

## 2017-09-01 DIAGNOSIS — M542 Cervicalgia: Secondary | ICD-10-CM | POA: Diagnosis not present

## 2017-09-01 DIAGNOSIS — R293 Abnormal posture: Secondary | ICD-10-CM | POA: Diagnosis not present

## 2017-09-01 DIAGNOSIS — M9901 Segmental and somatic dysfunction of cervical region: Secondary | ICD-10-CM | POA: Diagnosis not present

## 2017-09-01 DIAGNOSIS — M2560 Stiffness of unspecified joint, not elsewhere classified: Secondary | ICD-10-CM | POA: Diagnosis not present

## 2017-09-04 ENCOUNTER — Ambulatory Visit (INDEPENDENT_AMBULATORY_CARE_PROVIDER_SITE_OTHER): Payer: BLUE CROSS/BLUE SHIELD | Admitting: Internal Medicine

## 2017-09-04 ENCOUNTER — Encounter: Payer: Self-pay | Admitting: Internal Medicine

## 2017-09-04 VITALS — BP 128/84 | HR 90 | Temp 98.4°F | Resp 20 | Ht 63.19 in | Wt 183.6 lb

## 2017-09-04 DIAGNOSIS — R739 Hyperglycemia, unspecified: Secondary | ICD-10-CM

## 2017-09-04 DIAGNOSIS — Z1231 Encounter for screening mammogram for malignant neoplasm of breast: Secondary | ICD-10-CM | POA: Diagnosis not present

## 2017-09-04 DIAGNOSIS — F32A Depression, unspecified: Secondary | ICD-10-CM

## 2017-09-04 DIAGNOSIS — Z86010 Personal history of colonic polyps: Secondary | ICD-10-CM

## 2017-09-04 DIAGNOSIS — E7800 Pure hypercholesterolemia, unspecified: Secondary | ICD-10-CM | POA: Diagnosis not present

## 2017-09-04 DIAGNOSIS — F329 Major depressive disorder, single episode, unspecified: Secondary | ICD-10-CM | POA: Diagnosis not present

## 2017-09-04 DIAGNOSIS — B009 Herpesviral infection, unspecified: Secondary | ICD-10-CM | POA: Diagnosis not present

## 2017-09-04 DIAGNOSIS — F419 Anxiety disorder, unspecified: Secondary | ICD-10-CM

## 2017-09-04 DIAGNOSIS — R232 Flushing: Secondary | ICD-10-CM

## 2017-09-04 DIAGNOSIS — Z1239 Encounter for other screening for malignant neoplasm of breast: Secondary | ICD-10-CM

## 2017-09-04 MED ORDER — VENLAFAXINE HCL ER 37.5 MG PO CP24: 37.5000 mg | ORAL_CAPSULE | Freq: Every day | ORAL | 1 refills | Status: DC

## 2017-09-04 MED ORDER — ACYCLOVIR 400 MG PO TABS: 400.0000 mg | ORAL_TABLET | Freq: Two times a day (BID) | ORAL | 1 refills | Status: DC

## 2017-09-04 NOTE — Progress Notes (Signed)
Patient ID: Tracy Phillips, female   DOB: 12/31/1965, 51 y.o.   MRN: [id number]   Subjective:    Patient ID: Tracy RiasKim G Eardley, female    DOB: 04/07/1966, 51 y.o.   MRN: [id number]  HPI  Patient here to reestablish care.  Reports increased stress.  Has taken celexa intermittently.  Feel needs to be on something for increased stress and anxiety.  Discussed with her today.  She is also having hot flashes.  Affecting her sleep.  Has tried soy products, decreasing caffeine and dairy.  Desires to have medication for this.  Mother has a history of breast cancer.  Would like to avoid estrogen therapy.  She also has been having some increased issues with her back and neck.  Seeing a Land[facility].  Had xrays.  Told had OA.  Has been going for adjustments.  Pain more localized to left posterior shoulder/neck and mid back.  Chiropractor visits are helping.  More mobility now.  She did state they felt "something was pushing on her thyroid".  She has not problems swallowing.  Has previously taken wellbutrin and paxil.  Did not tolerate. Had increased heart palpitations with these medications.     Past Medical History:  Diagnosis Date  . Depression   . H/O dizziness   . H/O eating disorder   . H/O: alcohol abuse   . Herpes    H/O  . History of chicken pox   . History of colon polyps   . Hyperlipidemia   . Hypertension   . Kidney stones    H/O stones   Past Surgical History:  Procedure Laterality Date  . ABDOMINAL HYSTERECTOMY  1998  . APPENDECTOMY  1997  . DILATION AND CURETTAGE OF UTERUS    . TONSILLECTOMY  1997   Family History  Problem Relation Age of Onset  . Hyperlipidemia Mother   . Hypertension Mother   . Arthritis Father   . Hyperlipidemia Father   . Hypertension Father   . Diabetes Father   . Hypertension Brother   . Arthritis Maternal Aunt   . Hyperlipidemia Maternal Aunt   . Hypertension Maternal Aunt   . Alcohol abuse Maternal Uncle   . Arthritis Maternal Uncle   .  Hyperlipidemia Maternal Uncle   . Hypertension Maternal Uncle   . Arthritis Paternal Aunt   . Hyperlipidemia Paternal Aunt   . Hypertension Paternal Aunt   . Arthritis Paternal Uncle   . Hyperlipidemia Paternal Uncle   . Hypertension Paternal Uncle   . Arthritis Maternal Grandmother   . Hyperlipidemia Maternal Grandmother   . Hypertension Maternal Grandmother   . Arthritis Maternal Grandfather   . Lung cancer Maternal Grandfather   . Prostate cancer Maternal Grandfather   . Hyperlipidemia Maternal Grandfather   . Stroke Maternal Grandfather   . Hypertension Maternal Grandfather   . Arthritis Paternal Grandmother   . Hyperlipidemia Paternal Grandmother   . Hypertension Paternal Grandmother   . Alcohol abuse Paternal Grandfather   . Arthritis Paternal Grandfather   . Hyperlipidemia Paternal Grandfather   . Hypertension Paternal Grandfather   . Sudden death Paternal Grandfather    Social History   Social History  . Marital status: Single    Spouse name: N/A  . Number of children: N/A  . Years of education: N/A   Social History Main Topics  . Smoking status: Never Smoker  . Smokeless tobacco: Never Used  . Alcohol use No     Comment: sober since 10/27/2010  .  Drug use: No  . Sexual activity: Not Asked   Other Topics Concern  . None   Social History Narrative  . None    Outpatient Encounter Prescriptions as of 09/04/2017  Medication Sig  . acyclovir (ZOVIRAX) 400 MG tablet Take 1 tablet (400 mg total) by mouth 2 (two) times daily.  . Flaxseed, Linseed, (FLAX SEED OIL PO) Take by mouth.  . vitamin E 1000 UNIT capsule Take 1,000 Units by mouth daily.  . [DISCONTINUED] acyclovir (ZOVIRAX) 400 MG tablet Take 1 tablet (400 mg total) by mouth 2 (two) times daily.  [name] Kitchen venlafaxine XR (EFFEXOR XR) 37.5 MG 24 hr capsule Take 1 capsule (37.5 mg total) by mouth daily with breakfast.   No facility-administered encounter medications on file as of 09/04/2017.     Review of  Systems  Constitutional: Negative for appetite change and unexpected weight change.  HENT: Negative for congestion and sinus pressure.   Respiratory: Negative for cough, chest tightness and shortness of breath.   Cardiovascular: Negative for chest pain, palpitations and leg swelling.  Gastrointestinal: Negative for abdominal pain, diarrhea, nausea and vomiting.  Endocrine:       Hot flashes as outlined.    Genitourinary: Negative for difficulty urinating and dysuria.  Musculoskeletal: Positive for back pain. Negative for joint swelling and myalgias.  Skin: Negative for color change and rash.  Neurological: Negative for dizziness, light-headedness and headaches.  Psychiatric/Behavioral:       Increased stress and anxiety as outlined.         Objective:     Blood pressure rechecked by me:  128/84  Physical Exam  Constitutional: She appears well-developed and well-nourished. No distress.  HENT:  Nose: Nose normal.  Mouth/Throat: Oropharynx is clear and moist.  Neck: Neck supple. No thyromegaly present.  Cardiovascular: Normal rate and regular rhythm.   Pulmonary/Chest: Breath sounds normal. No respiratory distress. She has no wheezes.  Abdominal: Soft. Bowel sounds are normal. There is no tenderness.  Musculoskeletal: She exhibits no edema or tenderness.  Lymphadenopathy:    She has no cervical adenopathy.  Skin: No rash noted. No erythema.  Psychiatric: She has a normal mood and affect. Her behavior is normal.    BP 128/84   Pulse 90   Temp 98.4 F (36.9 C) (Oral)   Resp 20   Ht 5' 3.19" (1.605 m)   Wt 183 lb 9.6 oz (83.3 kg)   SpO2 98%   BMI 32.33 kg/m  Wt Readings from Last 3 Encounters:  09/04/17 183 lb 9.6 oz (83.3 kg)  11/05/13 186 lb 8 oz (84.6 kg)  10/11/13 186 lb (84.4 kg)     Lab Results  Component Value Date   WBC 8.8 11/24/2013   HGB 13.5 11/24/2013   HCT 39.8 11/24/2013   PLT 211.0 11/24/2013   GLUCOSE 106 (H) 11/24/2013   CHOL 253 (H) 11/24/2013    TRIG 211.0 (H) 11/24/2013   HDL 36.80 (L) 11/24/2013   LDLDIRECT 177.8 11/24/2013   ALT 15 11/24/2013   AST 16 11/24/2013   NA 139 11/24/2013   K 4.0 11/24/2013   CL 105 11/24/2013   CREATININE 0.8 11/24/2013   BUN 14 11/24/2013   CO2 24 11/24/2013   TSH 1.41 11/24/2013   HGBA1C 6.0 12/07/2013       Assessment & Plan:   Problem List Items Addressed This Visit    Anxiety    Increased anxiety as outlined.  Start effexor.  Follow.  Relevant Medications   venlafaxine XR (EFFEXOR XR) 37.5 MG 24 hr capsule   Other Relevant Orders   CBC with Differential/Platelet   TSH   Depression    Has history of anxiety and depression (mild).  Discussed with her today.  Citalopram has helped in the past.  Intolerance to paxil and wellbutrin.  Given increased hot flashes, trial of effexor.  Start with 37.5mg  q day.  Increase if needed.  Follow closely.        Relevant Medications   venlafaxine XR (EFFEXOR XR) 37.5 MG 24 hr capsule   Other Relevant Orders   CBC with Differential/Platelet   TSH   Herpes simplex    Request to have suppressive medication.  Will start daily acyclovir.        Relevant Medications   acyclovir (ZOVIRAX) 400 MG tablet   Hot flashes    Has tried otc soy products.  Has tried decreasing caffeine, etc.  Mother has breast cancer.  Avoid estrogen.  Start effexor.  Follow.        Hypercholesterolemia    Low cholesterol diet and exercise.  Follow lipid panel.        Relevant Orders   Hepatic function panel   Lipid panel   Basic metabolic panel   Personal history of colonic polyps    Need records of last colonoscopy.         Other Visit Diagnoses    Breast cancer screening    -  Primary   Relevant Orders   MM DIGITAL SCREENING BILATERAL   Hyperglycemia       Relevant Orders   Hemoglobin A1c       Dale Hornell, MD

## 2017-09-05 ENCOUNTER — Telehealth: Payer: Self-pay | Admitting: Internal Medicine

## 2017-09-05 DIAGNOSIS — M2560 Stiffness of unspecified joint, not elsewhere classified: Secondary | ICD-10-CM | POA: Diagnosis not present

## 2017-09-05 DIAGNOSIS — R293 Abnormal posture: Secondary | ICD-10-CM | POA: Diagnosis not present

## 2017-09-05 DIAGNOSIS — M9901 Segmental and somatic dysfunction of cervical region: Secondary | ICD-10-CM | POA: Diagnosis not present

## 2017-09-05 DIAGNOSIS — M542 Cervicalgia: Secondary | ICD-10-CM | POA: Diagnosis not present

## 2017-09-05 NOTE — Telephone Encounter (Signed)
Patient informed no questions at this time.   

## 2017-09-05 NOTE — Telephone Encounter (Signed)
Note not done form yesterdays office visit

## 2017-09-05 NOTE — Telephone Encounter (Signed)
Pt stated that she was told the  acyclovir (ZOVIRAX) 400 MG tablet was going to be changed how pt takes it and dosage. Pt states nothing was changed please advise?  Call pt @ [phone]. Thank you!

## 2017-09-05 NOTE — Telephone Encounter (Signed)
She can take acyclovir 400mg  q day.

## 2017-09-07 ENCOUNTER — Encounter: Payer: Self-pay | Admitting: Internal Medicine

## 2017-09-07 DIAGNOSIS — F32A Depression, unspecified: Secondary | ICD-10-CM | POA: Insufficient documentation

## 2017-09-07 DIAGNOSIS — R232 Flushing: Secondary | ICD-10-CM | POA: Insufficient documentation

## 2017-09-07 DIAGNOSIS — F419 Anxiety disorder, unspecified: Secondary | ICD-10-CM | POA: Insufficient documentation

## 2017-09-07 NOTE — Assessment & Plan Note (Signed)
Has tried otc soy products.  Has tried decreasing caffeine, etc.  Mother has breast cancer.  Avoid estrogen.  Start effexor.  Follow.

## 2017-09-07 NOTE — Assessment & Plan Note (Signed)
Request to have suppressive medication.  Will start daily acyclovir.

## 2017-09-07 NOTE — Assessment & Plan Note (Signed)
Low cholesterol diet and exercise.  Follow lipid panel.   

## 2017-09-07 NOTE — Assessment & Plan Note (Signed)
Increased anxiety as outlined.  Start effexor.  Follow.

## 2017-09-07 NOTE — Assessment & Plan Note (Signed)
Need records of last colonoscopy.

## 2017-09-07 NOTE — Assessment & Plan Note (Signed)
Has history of anxiety and depression (mild).  Discussed with her today.  Citalopram has helped in the past.  Intolerance to paxil and wellbutrin.  Given increased hot flashes, trial of effexor.  Start with 37.5mg  q day.  Increase if needed.  Follow closely.

## 2017-09-10 DIAGNOSIS — M9901 Segmental and somatic dysfunction of cervical region: Secondary | ICD-10-CM | POA: Diagnosis not present

## 2017-09-10 DIAGNOSIS — M542 Cervicalgia: Secondary | ICD-10-CM | POA: Diagnosis not present

## 2017-09-10 DIAGNOSIS — M2560 Stiffness of unspecified joint, not elsewhere classified: Secondary | ICD-10-CM | POA: Diagnosis not present

## 2017-09-10 DIAGNOSIS — R293 Abnormal posture: Secondary | ICD-10-CM | POA: Diagnosis not present

## 2017-09-11 ENCOUNTER — Other Ambulatory Visit (INDEPENDENT_AMBULATORY_CARE_PROVIDER_SITE_OTHER): Payer: BLUE CROSS/BLUE SHIELD

## 2017-09-11 DIAGNOSIS — F329 Major depressive disorder, single episode, unspecified: Secondary | ICD-10-CM

## 2017-09-11 DIAGNOSIS — R739 Hyperglycemia, unspecified: Secondary | ICD-10-CM

## 2017-09-11 DIAGNOSIS — F419 Anxiety disorder, unspecified: Secondary | ICD-10-CM

## 2017-09-11 DIAGNOSIS — E7800 Pure hypercholesterolemia, unspecified: Secondary | ICD-10-CM | POA: Diagnosis not present

## 2017-09-11 DIAGNOSIS — F32A Depression, unspecified: Secondary | ICD-10-CM

## 2017-09-11 LAB — LDL CHOLESTEROL, DIRECT: Direct LDL: 191 mg/dL

## 2017-09-11 LAB — BASIC METABOLIC PANEL
BUN: 18 mg/dL (ref 6–23)
CHLORIDE: 103 meq/L (ref 96–112)
CO2: 27 mEq/L (ref 19–32)
Calcium: 10.1 mg/dL (ref 8.4–10.5)
Creatinine, Ser: 0.78 mg/dL (ref 0.40–1.20)
GFR: 83.44 mL/min (ref >60.00)
GLUCOSE: 121 mg/dL — AB (ref 70–99)
POTASSIUM: 4.2 meq/L (ref 3.5–5.1)
Sodium: 139 mEq/L (ref 135–145)

## 2017-09-11 LAB — LIPID PANEL
CHOL/HDL RATIO: 5
Cholesterol: 281 mg/dL — ABNORMAL HIGH (ref 0–200)
HDL: 53 mg/dL (ref >39.00)
NonHDL: 227.95
TRIGLYCERIDES: 261 mg/dL — AB (ref 0.0–149.0)
VLDL: 52.2 mg/dL — ABNORMAL HIGH (ref 0.0–40.0)

## 2017-09-11 LAB — HEPATIC FUNCTION PANEL
ALK PHOS: 58 U/L (ref 39–117)
ALT: 13 U/L (ref 0–35)
AST: 12 U/L (ref 0–37)
Albumin: 4.6 g/dL (ref 3.5–5.2)
BILIRUBIN DIRECT: 0.1 mg/dL (ref 0.0–0.3)
BILIRUBIN TOTAL: 0.5 mg/dL (ref 0.2–1.2)
TOTAL PROTEIN: 7.9 g/dL (ref 6.0–8.3)

## 2017-09-11 LAB — CBC WITH DIFFERENTIAL/PLATELET
BASOS PCT: 0.6 % (ref 0.0–3.0)
Basophils Absolute: 0 10*3/uL (ref 0.0–0.1)
EOS ABS: 0.1 10*3/uL (ref 0.0–0.7)
EOS PCT: 1.8 % (ref 0.0–5.0)
HCT: 41.2 % (ref 36.0–46.0)
Hemoglobin: 13.6 g/dL (ref 12.0–15.0)
LYMPHS ABS: 2 10*3/uL (ref 0.7–4.0)
Lymphocytes Relative: 36.9 % (ref 12.0–46.0)
MCHC: 33 g/dL (ref 30.0–36.0)
MCV: 92.5 fl (ref 78.0–100.0)
MONO ABS: 0.5 10*3/uL (ref 0.1–1.0)
Monocytes Relative: 8.3 % (ref 3.0–12.0)
NEUTROS ABS: 2.8 10*3/uL (ref 1.4–7.7)
NEUTROS PCT: 52.4 % (ref 43.0–77.0)
PLATELETS: 249 10*3/uL (ref 150.0–400.0)
RBC: 4.46 Mil/uL (ref 3.87–5.11)
RDW: 14.8 % (ref 11.5–15.5)
WBC: 5.4 10*3/uL (ref 4.0–10.5)

## 2017-09-11 LAB — TSH: TSH: 1.47 u[IU]/mL (ref 0.35–4.50)

## 2017-09-11 LAB — HEMOGLOBIN A1C: Hgb A1c MFr Bld: 6.3 % (ref 4.6–6.5)

## 2017-09-25 ENCOUNTER — Ambulatory Visit
Admission: RE | Admit: 2017-09-25 | Discharge: 2017-09-25 | Disposition: A | Discharge status: Home / Self Care | Payer: BLUE CROSS/BLUE SHIELD | Source: Ambulatory Visit | Attending: Internal Medicine | Admitting: Internal Medicine

## 2017-09-25 DIAGNOSIS — Z1231 Encounter for screening mammogram for malignant neoplasm of breast: Secondary | ICD-10-CM | POA: Insufficient documentation

## 2017-09-25 DIAGNOSIS — Z1239 Encounter for other screening for malignant neoplasm of breast: Secondary | ICD-10-CM

## 2017-09-26 ENCOUNTER — Encounter: Payer: Self-pay | Admitting: Internal Medicine

## 2017-09-26 DIAGNOSIS — Z0000 Encounter for general adult medical examination without abnormal findings: Secondary | ICD-10-CM | POA: Insufficient documentation

## 2017-10-28 ENCOUNTER — Other Ambulatory Visit: Payer: Self-pay | Admitting: Internal Medicine

## 2017-11-05 ENCOUNTER — Ambulatory Visit (INDEPENDENT_AMBULATORY_CARE_PROVIDER_SITE_OTHER): Payer: BLUE CROSS/BLUE SHIELD | Admitting: Internal Medicine

## 2017-11-05 ENCOUNTER — Ambulatory Visit: Payer: BLUE CROSS/BLUE SHIELD | Admitting: Internal Medicine

## 2017-11-05 ENCOUNTER — Encounter: Payer: Self-pay | Admitting: Internal Medicine

## 2017-11-05 VITALS — BP 128/84 | HR 91 | Temp 98.5°F | Resp 18 | Ht 60.0 in | Wt 180.5 lb

## 2017-11-05 DIAGNOSIS — F419 Anxiety disorder, unspecified: Secondary | ICD-10-CM | POA: Diagnosis not present

## 2017-11-05 DIAGNOSIS — E7800 Pure hypercholesterolemia, unspecified: Secondary | ICD-10-CM

## 2017-11-05 DIAGNOSIS — F32A Depression, unspecified: Secondary | ICD-10-CM

## 2017-11-05 DIAGNOSIS — F329 Major depressive disorder, single episode, unspecified: Secondary | ICD-10-CM

## 2017-11-05 DIAGNOSIS — R232 Flushing: Secondary | ICD-10-CM | POA: Diagnosis not present

## 2017-11-05 DIAGNOSIS — J3489 Other specified disorders of nose and nasal sinuses: Secondary | ICD-10-CM | POA: Diagnosis not present

## 2017-11-05 MED ORDER — VENLAFAXINE HCL ER 75 MG PO CP24: 75.0000 mg | ORAL_CAPSULE | Freq: Every day | ORAL | 2 refills | Status: DC

## 2017-11-05 NOTE — Progress Notes (Signed)
Pre-visit discussion using our clinic review tool. No additional management support is needed unless otherwise documented below in the visit note.  

## 2017-11-05 NOTE — Patient Instructions (Signed)
nasacort nasal spray - 2 sprays each nostril one time per day.  Do this in the evening.   

## 2017-11-05 NOTE — Progress Notes (Signed)
Patient ID: Tracy Phillips, female   DOB: May 28, 1966, 51 y.o.   MRN: [id number]   Subjective:    Patient ID: Tracy Phillips, female    DOB: 1966/05/27, 51 y.o.   MRN: [id number]  HPI  Patient here for a scheduled follow up. She reports she feels some better.  Does not feel depressed.  Hot flashes are better.  Still with some anxiety.  Feels effexor is helping.  Trying to stay active.  No chest pain.  No sob. No acid reflux.  No abdominal pain.  Bowels moving.  Does have thick drainage.  Sometimes notices getting choked.  Able to eat without difficulty.  Does notice occasionally with drinking.     Past Medical History:  Diagnosis Date  . Depression   . H/O dizziness   . H/O eating disorder   . H/O: alcohol abuse   . Herpes    H/O  . History of chicken pox   . History of colon polyps   . Hyperlipidemia   . Hypertension   . Kidney stones    H/O stones   Past Surgical History:  Procedure Laterality Date  . ABDOMINAL HYSTERECTOMY  1998  . APPENDECTOMY  1997  . DILATION AND CURETTAGE OF UTERUS    . TONSILLECTOMY  1997   Family History  Problem Relation Age of Onset  . Hyperlipidemia Mother   . Hypertension Mother   . Breast cancer Mother 39  . Arthritis Father   . Hyperlipidemia Father   . Hypertension Father   . Diabetes Father   . Hypertension Brother   . Arthritis Maternal Aunt   . Hyperlipidemia Maternal Aunt   . Hypertension Maternal Aunt   . Alcohol abuse Maternal Uncle   . Arthritis Maternal Uncle   . Hyperlipidemia Maternal Uncle   . Hypertension Maternal Uncle   . Arthritis Paternal Aunt   . Hyperlipidemia Paternal Aunt   . Hypertension Paternal Aunt   . Arthritis Paternal Uncle   . Hyperlipidemia Paternal Uncle   . Hypertension Paternal Uncle   . Arthritis Maternal Grandmother   . Hyperlipidemia Maternal Grandmother   . Hypertension Maternal Grandmother   . Arthritis Maternal Grandfather   . Lung cancer Maternal Grandfather   . Prostate cancer  Maternal Grandfather   . Hyperlipidemia Maternal Grandfather   . Stroke Maternal Grandfather   . Hypertension Maternal Grandfather   . Arthritis Paternal Grandmother   . Hyperlipidemia Paternal Grandmother   . Hypertension Paternal Grandmother   . Alcohol abuse Paternal Grandfather   . Arthritis Paternal Grandfather   . Hyperlipidemia Paternal Grandfather   . Hypertension Paternal Grandfather   . Sudden death Paternal Grandfather    Social History   Socioeconomic History  . Marital status: Single    Spouse name: None  . Number of children: None  . Years of education: None  . Highest education level: None  Social Needs  . Financial resource strain: None  . Food insecurity - worry: None  . Food insecurity - inability: None  . Transportation needs - medical: None  . Transportation needs - non-medical: None  Occupational History  . None  Tobacco Use  . Smoking status: Never Smoker  . Smokeless tobacco: Never Used  Substance and Sexual Activity  . Alcohol use: No    Comment: sober since 10/27/2010  . Drug use: No  . Sexual activity: None  Other Topics Concern  . None  Social History Narrative  . None    Outpatient Encounter  Medications as of 11/05/2017  Medication Sig  . acyclovir (ZOVIRAX) 400 MG tablet TAKE 1 TABLET BY MOUTH TWICE A DAY  . Flaxseed, Linseed, (FLAX SEED OIL PO) Take by mouth.  . vitamin E 1000 UNIT capsule Take 1,000 Units by mouth daily.  . [DISCONTINUED] venlafaxine XR (EFFEXOR-XR) 37.5 MG 24 hr capsule TAKE 1 CAPSULE (37.5 MG TOTAL) BY MOUTH DAILY WITH BREAKFAST.  [name] Kitchen. venlafaxine XR (EFFEXOR XR) 75 MG 24 hr capsule Take 1 capsule (75 mg total) by mouth daily with breakfast.   No facility-administered encounter medications on file as of 11/05/2017.     Review of Systems  Constitutional: Negative for appetite change and unexpected weight change.  HENT: Positive for postnasal drip. Negative for sinus pressure and sore throat.   Respiratory: Negative  for cough, chest tightness and shortness of breath.   Cardiovascular: Negative for chest pain, palpitations and leg swelling.  Gastrointestinal: Negative for abdominal pain, diarrhea, nausea and vomiting.  Genitourinary: Negative for difficulty urinating and dysuria.  Musculoskeletal: Negative for joint swelling and myalgias.  Skin: Negative for color change and rash.  Neurological: Negative for dizziness, light-headedness and headaches.  Psychiatric/Behavioral:       Still with some anxiety.  No depression.        Objective:     Blood pressure rechecked by me:  128/84  Physical Exam  Constitutional: She appears well-developed and well-nourished. No distress.  HENT:  Nose: Nose normal.  Mouth/Throat: Oropharynx is clear and moist.  Neck: Neck supple. No thyromegaly present.  Cardiovascular: Normal rate and regular rhythm.  Pulmonary/Chest: Breath sounds normal. No respiratory distress. She has no wheezes.  Abdominal: Soft. Bowel sounds are normal. There is no tenderness.  Musculoskeletal: She exhibits no edema or tenderness.  Lymphadenopathy:    She has no cervical adenopathy.  Skin: No rash noted. No erythema.  Psychiatric: She has a normal mood and affect. Her behavior is normal.    BP 128/84   Pulse 91   Temp 98.5 F (36.9 C) (Oral)   Resp 18   Ht 5' (1.524 m)   Wt 180 lb 8 oz (81.9 kg)   SpO2 98%   BMI 35.25 kg/m  Wt Readings from Last 3 Encounters:  11/05/17 180 lb 8 oz (81.9 kg)  09/04/17 183 lb 9.6 oz (83.3 kg)  11/05/13 186 lb 8 oz (84.6 kg)     Lab Results  Component Value Date   WBC 5.4 09/11/2017   HGB 13.6 09/11/2017   HCT 41.2 09/11/2017   PLT 249.0 09/11/2017   GLUCOSE 121 (H) 09/11/2017   CHOL 281 (H) 09/11/2017   TRIG 261.0 (H) 09/11/2017   HDL 53.00 09/11/2017   LDLDIRECT 191.0 09/11/2017   ALT 13 09/11/2017   AST 12 09/11/2017   NA 139 09/11/2017   K 4.2 09/11/2017   CL 103 09/11/2017   CREATININE 0.78 09/11/2017   BUN 18 09/11/2017     CO2 27 09/11/2017   TSH 1.47 09/11/2017   HGBA1C 6.3 09/11/2017    Mm Digital Screening Bilateral  Result Date: 09/25/2017 CLINICAL DATA:  Screening. EXAM: DIGITAL SCREENING BILATERAL MAMMOGRAM WITH CAD COMPARISON:  Previous exam(s). ACR Breast Density Category c: The breast tissue is heterogeneously dense, which may obscure small masses. FINDINGS: There are no findings suspicious for malignancy. Images were processed with CAD. IMPRESSION: No mammographic evidence of malignancy. A result letter of this screening mammogram will be mailed directly to the patient. RECOMMENDATION: Screening mammogram in one year. (Code:SM-B-01Y) BI-RADS  CATEGORY  1: Negative. Electronically Signed   By: Britta MccreedySusan  Turner M.D.   On: 09/25/2017 18:08       Assessment & Plan:   Problem List Items Addressed This Visit    Anxiety    Still with persistent anxiety.  Overall feels better on effexor.  Will increase to 75mg  q day.  Follow.        Relevant Medications   venlafaxine XR (EFFEXOR XR) 75 MG 24 hr capsule   Depression    Better.  On effexor.  Will increase effexor to 75mg  q day for persistent anxiety issues.  Follow.        Relevant Medications   venlafaxine XR (EFFEXOR XR) 75 MG 24 hr capsule   Hot flashes    Better.  On effexor.  Follow.       Hypercholesterolemia    Cholesterol elevated.  Low cholesterol diet and exercise.  If persistent increase, will need statin medication.  Follow lipid panel and liver function tests.        Other Visit Diagnoses    Nasal drainage    -  Primary   Thick mucus and issues with swallowing as outlined.  Nasacort nasal spray as directed.  Follow.  Ger her back in soon to reassess.  May need further testing.        Dale DurhamSCOTT, Anett Ranker, MD

## 2017-11-08 NOTE — Assessment & Plan Note (Signed)
Cholesterol elevated.  Low cholesterol diet and exercise.  If persistent increase, will need statin medication.  Follow lipid panel and liver function tests.

## 2017-11-08 NOTE — Assessment & Plan Note (Signed)
Still with persistent anxiety.  Overall feels better on effexor.  Will increase to 75mg  q day.  Follow.

## 2017-11-08 NOTE — Assessment & Plan Note (Signed)
Better.  On effexor.  Will increase effexor to 75mg  q day for persistent anxiety issues.  Follow.

## 2017-11-08 NOTE — Assessment & Plan Note (Signed)
Better.  On effexor.  Follow.

## 2018-01-07 ENCOUNTER — Other Ambulatory Visit: Payer: Self-pay | Admitting: Internal Medicine

## 2018-01-13 ENCOUNTER — Ambulatory Visit: Payer: BLUE CROSS/BLUE SHIELD | Admitting: Internal Medicine

## 2018-01-13 DIAGNOSIS — Z0289 Encounter for other administrative examinations: Secondary | ICD-10-CM

## 2018-03-05 ENCOUNTER — Other Ambulatory Visit: Payer: Self-pay | Admitting: Internal Medicine

## 2018-04-22 ENCOUNTER — Other Ambulatory Visit: Payer: Self-pay | Admitting: Internal Medicine

## 2018-04-23 NOTE — Telephone Encounter (Signed)
Will ok refill x 1.  Needs cpe scheduled.

## 2018-04-23 NOTE — Telephone Encounter (Signed)
Last seen in 12/18. Pt no showed in 2/19. No future appt scheduled.

## 2018-05-28 ENCOUNTER — Other Ambulatory Visit: Payer: Self-pay | Admitting: Internal Medicine

## 2018-06-09 ENCOUNTER — Other Ambulatory Visit: Payer: Self-pay | Admitting: Internal Medicine

## 2018-08-05 ENCOUNTER — Other Ambulatory Visit: Payer: Self-pay | Admitting: Internal Medicine

## 2018-09-03 ENCOUNTER — Other Ambulatory Visit: Payer: Self-pay | Admitting: Internal Medicine

## 2018-09-04 ENCOUNTER — Other Ambulatory Visit: Payer: Self-pay

## 2018-09-04 ENCOUNTER — Telehealth: Payer: Self-pay | Admitting: Internal Medicine

## 2018-09-04 MED ORDER — VENLAFAXINE HCL ER 75 MG PO CP24: 75.0000 mg | ORAL_CAPSULE | Freq: Every day | ORAL | 2 refills | Status: DC

## 2018-09-04 NOTE — Telephone Encounter (Signed)
Copied from CRM [id number]. Topic: Quick Communication - Rx Refill/Question >> Sep 04, 2018  9:47 AM Jens Som A wrote: Medication: venlafaxine XR (EFFEXOR-XR) 75 MG 24 hr capsule [[id number]]   Has the patient contacted their pharmacy? Yes  (Agent: If no, request that the patient contact the pharmacy for the refill.) (Agent: If yes, when and what did the pharmacy advise?)  Preferred Pharmacy (with phone number or street name): [id number] [place], [place] - [address] [place] [address] Phone: [phone] Fax: [phone]    Agent: Please be advised that RX refills may take up to 3 business days. We ask that you follow-up with your pharmacy.

## 2018-10-23 ENCOUNTER — Other Ambulatory Visit: Payer: Self-pay | Admitting: Internal Medicine

## 2018-11-03 ENCOUNTER — Other Ambulatory Visit: Payer: Self-pay | Admitting: Internal Medicine

## 2019-01-01 ENCOUNTER — Other Ambulatory Visit: Payer: Self-pay | Admitting: Internal Medicine

## 2019-01-01 NOTE — Telephone Encounter (Signed)
Pt has not been seen since 2018 and is requesting a refill on acyclovir.  Please advise to refill.

## 2019-01-02 NOTE — Telephone Encounter (Signed)
She needs a f/u appt.  I have not seen her since 2018.

## 2019-01-06 NOTE — Telephone Encounter (Signed)
Left message for pt to call and schedule an appt with Dr Lorin Picket for further refills

## 2019-04-06 ENCOUNTER — Other Ambulatory Visit: Payer: Self-pay | Admitting: Internal Medicine

## 2019-04-21 ENCOUNTER — Telehealth: Payer: Self-pay | Admitting: Internal Medicine

## 2019-04-21 NOTE — Telephone Encounter (Signed)
[facility] called saying that pt needs a refill on acyclovir (ZOVIRAX) 400 MG tablet  Please advise pt  Pt hasn't been seen since 10/2017

## 2019-04-22 ENCOUNTER — Other Ambulatory Visit: Payer: Self-pay

## 2019-04-22 MED ORDER — ACYCLOVIR 400 MG PO TABS: 400.0000 mg | ORAL_TABLET | Freq: Two times a day (BID) | ORAL | 0 refills | Status: DC

## 2019-04-22 NOTE — Telephone Encounter (Signed)
Ok to refill x 1, but needs to keep f/u appt.

## 2019-04-22 NOTE — Telephone Encounter (Signed)
Pt aware.

## 2019-04-22 NOTE — Telephone Encounter (Signed)
Scheduled pt for phone visit. She is having a flare up and needs her acyclovir 400 mg refilled. Are you okay with filling for 30 days? Advised that she must keep appt for further refills. Confirmed no other acute issues at this time.

## 2019-04-26 ENCOUNTER — Other Ambulatory Visit: Payer: Self-pay

## 2019-04-26 ENCOUNTER — Ambulatory Visit (INDEPENDENT_AMBULATORY_CARE_PROVIDER_SITE_OTHER): Payer: BC Managed Care – PPO | Admitting: Internal Medicine

## 2019-04-26 ENCOUNTER — Encounter: Payer: Self-pay | Admitting: Internal Medicine

## 2019-04-26 DIAGNOSIS — R739 Hyperglycemia, unspecified: Secondary | ICD-10-CM

## 2019-04-26 DIAGNOSIS — Z1239 Encounter for other screening for malignant neoplasm of breast: Secondary | ICD-10-CM | POA: Diagnosis not present

## 2019-04-26 DIAGNOSIS — R232 Flushing: Secondary | ICD-10-CM

## 2019-04-26 DIAGNOSIS — E7800 Pure hypercholesterolemia, unspecified: Secondary | ICD-10-CM

## 2019-04-26 DIAGNOSIS — F32A Depression, unspecified: Secondary | ICD-10-CM

## 2019-04-26 DIAGNOSIS — F419 Anxiety disorder, unspecified: Secondary | ICD-10-CM

## 2019-04-26 DIAGNOSIS — Z86010 Personal history of colonic polyps: Secondary | ICD-10-CM

## 2019-04-26 DIAGNOSIS — F329 Major depressive disorder, single episode, unspecified: Secondary | ICD-10-CM

## 2019-04-26 MED ORDER — ACYCLOVIR 400 MG PO TABS: 400.0000 mg | ORAL_TABLET | Freq: Two times a day (BID) | ORAL | 4 refills | Status: DC

## 2019-04-26 NOTE — Progress Notes (Signed)
Patient ID: Tracy Phillips, female   DOB: 1966/05/13, 53 y.o.   MRN: [id number]   Virtual Visit via telephone Note  This visit type was conducted due to national recommendations for restrictions regarding the COVID-19 pandemic (e.g. social distancing).  This format is felt to be most appropriate for this patient at this time.  All issues noted in this document were discussed and addressed.  No physical exam was performed (except for noted visual exam findings with Video Visits).   I connected with Tracy Phillips by telephone and verified that I am speaking with the correct person using two identifiers. Location patient: home Location provider: work  Persons participating in the telephone visit: patient, provider  I discussed the limitations, risks, security and privacy concerns of performing an evaluation and management service by telephone and the availability of in person appointments.  The patient expressed understanding and agreed to proceed.   Reason for visit: scheduled follow up.    HPI: Scheduled follow up.  Have not seen her since 2018.  She is doing well.  Handling stress.  Has adjusted her diet.  Has lost weight.  Feels better.  States she is eating healthy.  Weight loss has been gradual.  No chest pain.  No sob.  No acid reflux.  No abdominal pain.  Bowels moving.  On effexor.  Doing well on effexor.  No known covid exposure.  No fever.  No chest congestion or sob.  Discussed need for labs, mammogram.  Also due for colonoscopy.     ROS: See pertinent positives and negatives per HPI.  Past Medical History:  Diagnosis Date  . Depression   . H/O dizziness   . H/O eating disorder   . H/O: alcohol abuse   . Herpes    H/O  . History of chicken pox   . History of colon polyps   . Hyperlipidemia   . Hypertension   . Kidney stones    H/O stones    Past Surgical History:  Procedure Laterality Date  . ABDOMINAL HYSTERECTOMY  1998  . APPENDECTOMY  1997  . DILATION AND  CURETTAGE OF UTERUS    . TONSILLECTOMY  1997    Family History  Problem Relation Age of Onset  . Hyperlipidemia Mother   . Hypertension Mother   . Breast cancer Mother 72  . Arthritis Father   . Hyperlipidemia Father   . Hypertension Father   . Diabetes Father   . Hypertension Brother   . Arthritis Maternal Aunt   . Hyperlipidemia Maternal Aunt   . Hypertension Maternal Aunt   . Alcohol abuse Maternal Uncle   . Arthritis Maternal Uncle   . Hyperlipidemia Maternal Uncle   . Hypertension Maternal Uncle   . Arthritis Paternal Aunt   . Hyperlipidemia Paternal Aunt   . Hypertension Paternal Aunt   . Arthritis Paternal Uncle   . Hyperlipidemia Paternal Uncle   . Hypertension Paternal Uncle   . Arthritis Maternal Grandmother   . Hyperlipidemia Maternal Grandmother   . Hypertension Maternal Grandmother   . Arthritis Maternal Grandfather   . Lung cancer Maternal Grandfather   . Prostate cancer Maternal Grandfather   . Hyperlipidemia Maternal Grandfather   . Stroke Maternal Grandfather   . Hypertension Maternal Grandfather   . Arthritis Paternal Grandmother   . Hyperlipidemia Paternal Grandmother   . Hypertension Paternal Grandmother   . Alcohol abuse Paternal Grandfather   . Arthritis Paternal Grandfather   . Hyperlipidemia Paternal Grandfather   .  Hypertension Paternal Grandfather   . Sudden death Paternal Grandfather     SOCIAL HX: reviewed.    Current Outpatient Medications:  .  acyclovir (ZOVIRAX) 400 MG tablet, Take 1 tablet (400 mg total) by mouth 2 (two) times daily., Disp: 60 tablet, Rfl: 4 .  venlafaxine XR (EFFEXOR-XR) 75 MG 24 hr capsule, TAKE 1 CAPSULE (75 MG TOTAL) BY MOUTH DAILY WITH BREAKFAST., Disp: 90 capsule, Rfl: 1  EXAM:  VITALS per patient if applicable:  Weight 142 pounds.   GENERAL: alert, oriented, appears well and in no acute distress  HEENT: atraumatic, conjunttiva clear, no obvious abnormalities on inspection of external nose and ears   NECK: normal movements of the head and neck  LUNGS: on inspection no signs of respiratory distress, breathing rate appears normal, no obvious gross SOB, gasping or wheezing  CV: no obvious cyanosis  PSYCH/NEURO: pleasant and cooperative, no obvious depression or anxiety, speech and thought processing grossly intact  ASSESSMENT AND PLAN:  Discussed the following assessment and plan:  Anxiety Doing well on effexor.  Follow.    Depression Doing well on effexor.  Follow.    Hot flashes Doing well on effexor.    Hypercholesterolemia Low cholesterol diet and exercise.  Schedule for fasting lipid panel.    Personal history of colonic polyps States had colonoscopy 10 years ago at [facility]. See if we can obtain records.  Due f/u.  Refer to GI.   Hyperglycemia She has adjusted her diet.  Lost weight.  Recheck a1c.      I discussed the assessment and treatment plan with the patient. The patient was provided an opportunity to ask questions and all were answered. The patient agreed with the plan and demonstrated an understanding of the instructions.   The patient was advised to call back or seek an in-person evaluation if the symptoms worsen or if the condition fails to improve as anticipated.  I provided 15 minutes of non-face-to-face time during this encounter.   Dale Durhamharlene Brennley Curtice, MD

## 2019-05-02 DIAGNOSIS — R739 Hyperglycemia, unspecified: Secondary | ICD-10-CM | POA: Insufficient documentation

## 2019-05-02 NOTE — Assessment & Plan Note (Signed)
Doing well on effexor.   

## 2019-05-02 NOTE — Assessment & Plan Note (Signed)
She has adjusted her diet.  Lost weight.  Recheck a1c.

## 2019-05-02 NOTE — Assessment & Plan Note (Signed)
States had colonoscopy 10 years ago at [facility]. See if we can obtain records.  Due f/u.  Refer to GI.

## 2019-05-02 NOTE — Assessment & Plan Note (Signed)
Doing well on effexor.  Follow.   

## 2019-05-02 NOTE — Assessment & Plan Note (Signed)
Low cholesterol diet and exercise.  Schedule for fasting lipid panel.

## 2019-05-09 ENCOUNTER — Other Ambulatory Visit: Payer: Self-pay | Admitting: Internal Medicine

## 2019-05-11 ENCOUNTER — Encounter: Payer: Self-pay | Admitting: Internal Medicine

## 2019-05-13 ENCOUNTER — Other Ambulatory Visit: Payer: Self-pay

## 2019-05-13 ENCOUNTER — Other Ambulatory Visit (INDEPENDENT_AMBULATORY_CARE_PROVIDER_SITE_OTHER): Payer: BC Managed Care – PPO

## 2019-05-13 DIAGNOSIS — E7800 Pure hypercholesterolemia, unspecified: Secondary | ICD-10-CM | POA: Diagnosis not present

## 2019-05-13 DIAGNOSIS — R739 Hyperglycemia, unspecified: Secondary | ICD-10-CM | POA: Diagnosis not present

## 2019-05-13 LAB — COMPREHENSIVE METABOLIC PANEL
ALT: 15 U/L (ref 0–35)
AST: 14 U/L (ref 0–37)
Albumin: 4.3 g/dL (ref 3.5–5.2)
Alkaline Phosphatase: 67 U/L (ref 39–117)
BUN: 17 mg/dL (ref 6–23)
CO2: 33 mEq/L — ABNORMAL HIGH (ref 19–32)
Calcium: 9.2 mg/dL (ref 8.4–10.5)
Chloride: 102 mEq/L (ref 96–112)
Creatinine, Ser: 0.71 mg/dL (ref 0.40–1.20)
GFR: 86.91 mL/min (ref >60.00)
Glucose, Bld: 100 mg/dL — ABNORMAL HIGH (ref 70–99)
Potassium: 4.2 mEq/L (ref 3.5–5.1)
Sodium: 142 mEq/L (ref 135–145)
Total Bilirubin: 0.3 mg/dL (ref 0.2–1.2)
Total Protein: 6.6 g/dL (ref 6.0–8.3)

## 2019-05-13 LAB — CBC WITH DIFFERENTIAL/PLATELET
Basophils Absolute: 0 10*3/uL (ref 0.0–0.1)
Basophils Relative: 0.6 % (ref 0.0–3.0)
Eosinophils Absolute: 0.2 10*3/uL (ref 0.0–0.7)
Eosinophils Relative: 3.6 % (ref 0.0–5.0)
HCT: 42.4 % (ref 36.0–46.0)
Hemoglobin: 14.2 g/dL (ref 12.0–15.0)
Lymphocytes Relative: 54.3 % — ABNORMAL HIGH (ref 12.0–46.0)
Lymphs Abs: 3 10*3/uL (ref 0.7–4.0)
MCHC: 33.6 g/dL (ref 30.0–36.0)
MCV: 91.5 fl (ref 78.0–100.0)
Monocytes Absolute: 0.4 10*3/uL (ref 0.1–1.0)
Monocytes Relative: 7.4 % (ref 3.0–12.0)
Neutro Abs: 1.9 10*3/uL (ref 1.4–7.7)
Neutrophils Relative %: 34.1 % — ABNORMAL LOW (ref 43.0–77.0)
Platelets: 218 10*3/uL (ref 150.0–400.0)
RBC: 4.63 Mil/uL (ref 3.87–5.11)
RDW: 14.6 % (ref 11.5–15.5)
WBC: 5.5 10*3/uL (ref 4.0–10.5)

## 2019-05-13 LAB — LIPID PANEL
Cholesterol: 241 mg/dL — ABNORMAL HIGH (ref 0–200)
HDL: 45.1 mg/dL (ref >39.00)
NonHDL: 196.32
Total CHOL/HDL Ratio: 5
Triglycerides: 309 mg/dL — ABNORMAL HIGH (ref 0.0–149.0)
VLDL: 61.8 mg/dL — ABNORMAL HIGH (ref 0.0–40.0)

## 2019-05-13 LAB — HEMOGLOBIN A1C: Hgb A1c MFr Bld: 5.9 % (ref 4.6–6.5)

## 2019-05-13 LAB — LDL CHOLESTEROL, DIRECT: Direct LDL: 132 mg/dL

## 2019-05-13 LAB — TSH: TSH: 2.95 u[IU]/mL (ref 0.35–4.50)

## 2019-05-25 ENCOUNTER — Telehealth: Payer: Self-pay | Admitting: Internal Medicine

## 2019-05-25 NOTE — Telephone Encounter (Signed)
No [facility] RN available to give lab results. Please call back to advise. Ok to give per lab result note.

## 2019-08-06 ENCOUNTER — Ambulatory Visit: Payer: BC Managed Care – PPO | Admitting: Internal Medicine

## 2019-08-26 ENCOUNTER — Encounter: Payer: BC Managed Care – PPO | Admitting: Internal Medicine

## 2019-11-02 ENCOUNTER — Other Ambulatory Visit: Payer: Self-pay | Admitting: Family

## 2019-11-05 ENCOUNTER — Encounter: Payer: Self-pay | Admitting: Internal Medicine

## 2019-11-05 ENCOUNTER — Ambulatory Visit (INDEPENDENT_AMBULATORY_CARE_PROVIDER_SITE_OTHER): Payer: BLUE CROSS/BLUE SHIELD | Admitting: Internal Medicine

## 2019-11-05 ENCOUNTER — Other Ambulatory Visit: Payer: Self-pay

## 2019-11-05 VITALS — Ht 64.0 in | Wt 139.0 lb

## 2019-11-05 DIAGNOSIS — F329 Major depressive disorder, single episode, unspecified: Secondary | ICD-10-CM | POA: Diagnosis not present

## 2019-11-05 DIAGNOSIS — F419 Anxiety disorder, unspecified: Secondary | ICD-10-CM

## 2019-11-05 DIAGNOSIS — R739 Hyperglycemia, unspecified: Secondary | ICD-10-CM

## 2019-11-05 DIAGNOSIS — Z0000 Encounter for general adult medical examination without abnormal findings: Secondary | ICD-10-CM | POA: Diagnosis not present

## 2019-11-05 DIAGNOSIS — Z86010 Personal history of colonic polyps: Secondary | ICD-10-CM

## 2019-11-05 DIAGNOSIS — F32A Depression, unspecified: Secondary | ICD-10-CM

## 2019-11-05 DIAGNOSIS — R232 Flushing: Secondary | ICD-10-CM

## 2019-11-05 DIAGNOSIS — E7800 Pure hypercholesterolemia, unspecified: Secondary | ICD-10-CM

## 2019-11-05 MED ORDER — BUSPIRONE HCL 5 MG PO TABS: 5.0000 mg | ORAL_TABLET | Freq: Every day | ORAL | 1 refills | Status: DC

## 2019-11-05 NOTE — Progress Notes (Addendum)
Patient ID: Tracy Phillips, female   DOB: Jul 23, 1966, 53 y.o.   MRN: [id number]   Virtual Visit via video Note  This visit type was conducted due to national recommendations for restrictions regarding the COVID-19 pandemic (e.g. social distancing).  This format is felt to be most appropriate for this patient at this time.  All issues noted in this document were discussed and addressed.  No physical exam was performed (except for noted visual exam findings with Video Visits).   I connected with Tommie Raymond by a video enabled telemedicine application  and verified that I am speaking with the correct person using two identifiers. Location patient: home Location provider: work  Persons participating in the virtual visit: patient, provider  I discussed the limitations, risks, security and privacy concerns of performing an evaluation and management service by video and the availability of in person appointments.  The patient expressed understanding and agreed to proceed.   Reason for visit: scheduled for physical.  Changed to virtual physical.    HPI: Increased stress with family issues.  Brother's medical issues.  Her cat died.  Is sleeping more.  Does not want to get out and go places.  Does get out and do some volunteering and speaking with AA.  Her sleeping patter is affecting her sleep.  On effexor.  Discussed buspar  - to take at night. No chest pain or sob reported.  No abdominal pain or bowel change reported.  States at her "normal weight".  Doing well regarding hot flashes.     ROS: See pertinent positives and negatives per HPI.  Past Medical History:  Diagnosis Date  . Depression   . H/O dizziness   . H/O eating disorder   . H/O: alcohol abuse   . Herpes    H/O  . History of chicken pox   . History of colon polyps   . Hyperlipidemia   . Hypertension   . Kidney stones    H/O stones    Past Surgical History:  Procedure Laterality Date  . ABDOMINAL HYSTERECTOMY  1998  .  APPENDECTOMY  1997  . DILATION AND CURETTAGE OF UTERUS    . TONSILLECTOMY  1997    Family History  Problem Relation Age of Onset  . Hyperlipidemia Mother   . Hypertension Mother   . Breast cancer Mother 67  . Arthritis Father   . Hyperlipidemia Father   . Hypertension Father   . Diabetes Father   . Hypertension Brother   . Arthritis Maternal Aunt   . Hyperlipidemia Maternal Aunt   . Hypertension Maternal Aunt   . Alcohol abuse Maternal Uncle   . Arthritis Maternal Uncle   . Hyperlipidemia Maternal Uncle   . Hypertension Maternal Uncle   . Arthritis Paternal Aunt   . Hyperlipidemia Paternal Aunt   . Hypertension Paternal Aunt   . Arthritis Paternal Uncle   . Hyperlipidemia Paternal Uncle   . Hypertension Paternal Uncle   . Arthritis Maternal Grandmother   . Hyperlipidemia Maternal Grandmother   . Hypertension Maternal Grandmother   . Arthritis Maternal Grandfather   . Lung cancer Maternal Grandfather   . Prostate cancer Maternal Grandfather   . Hyperlipidemia Maternal Grandfather   . Stroke Maternal Grandfather   . Hypertension Maternal Grandfather   . Arthritis Paternal Grandmother   . Hyperlipidemia Paternal Grandmother   . Hypertension Paternal Grandmother   . Alcohol abuse Paternal Grandfather   . Arthritis Paternal Grandfather   . Hyperlipidemia Paternal Grandfather   .  Hypertension Paternal Grandfather   . Sudden death Paternal Grandfather     SOCIAL HX: reviewed.    Current Outpatient Medications:  .  acyclovir (ZOVIRAX) 400 MG tablet, Take 1 tablet (400 mg total) by mouth 2 (two) times daily., Disp: 60 tablet, Rfl: 4 .  venlafaxine XR (EFFEXOR-XR) 75 MG 24 hr capsule, TAKE 1 CAPSULE (75 MG TOTAL) BY MOUTH DAILY WITH BREAKFAST., Disp: 90 capsule, Rfl: 1 .  busPIRone (BUSPAR) 5 MG tablet, Take 1 tablet (5 mg total) by mouth at bedtime., Disp: 30 tablet, Rfl: 1  EXAM:  GENERAL: alert, oriented, appears well and in no acute distress  HEENT: atraumatic,  conjunttiva clear, no obvious abnormalities on inspection of external nose and ears  NECK: normal movements of the head and neck  LUNGS: on inspection no signs of respiratory distress, breathing rate appears normal, no obvious gross SOB, gasping or wheezing  CV: no obvious cyanosis  PSYCH/NEURO: pleasant and cooperative, no obvious depression or anxiety, speech and thought processing grossly intact  ASSESSMENT AND PLAN:  Discussed the following assessment and plan:  Anxiety On effexor.  Appears to be doing relatively well.  buspar q hs.  Follow.    Depression Discussed with her today.  On effexor.  Increased stress with family issue as outlined.  buspar q hs.  Follow.    Healthcare maintenance Virtual physical today.  Needs mammogram.  Has been ordered.  S/p hysterectomy.  Colon cancer screening.    Hypercholesterolemia Low cholesterol diet and exercise.  Follow lipid panel.   Hot flashes Not a significant issue.  Follow.    Hyperglycemia Low carb diet and exercise.  Follow met b and a1c.   Personal history of colonic polyps Have placed referral to GI for colon cancer screening    I discussed the assessment and treatment plan with the patient. The patient was provided an opportunity to ask questions and all were answered. The patient agreed with the plan and demonstrated an understanding of the instructions.   The patient was advised to call back or seek an in-person evaluation if the symptoms worsen or if the condition fails to improve as anticipated.   Einar Pheasant, MD

## 2019-11-13 ENCOUNTER — Encounter: Payer: Self-pay | Admitting: Internal Medicine

## 2019-11-13 NOTE — Assessment & Plan Note (Signed)
Virtual physical today.  Needs mammogram.  Has been ordered.  S/p hysterectomy.  Colon cancer screening.

## 2019-11-13 NOTE — Assessment & Plan Note (Signed)
Have placed referral to GI for colon cancer screening

## 2019-11-13 NOTE — Assessment & Plan Note (Signed)
On effexor.  Appears to be doing relatively well.  buspar q hs.  Follow.

## 2019-11-13 NOTE — Addendum Note (Signed)
Addended by: Alisa Graff on: 11/13/2019 01:05 PM   Modules accepted: Orders

## 2019-11-13 NOTE — Assessment & Plan Note (Signed)
Not a significant issue.  Follow.   

## 2019-11-13 NOTE — Assessment & Plan Note (Signed)
Low cholesterol diet and exercise.  Follow lipid panel.   

## 2019-11-13 NOTE — Assessment & Plan Note (Signed)
Discussed with her today.  On effexor.  Increased stress with family issue as outlined.  buspar q hs.  Follow.

## 2019-11-13 NOTE — Assessment & Plan Note (Signed)
Low carb diet and exercise.  Follow met b and a1c.  

## 2019-11-30 ENCOUNTER — Other Ambulatory Visit: Payer: Self-pay | Admitting: Internal Medicine

## 2019-12-24 ENCOUNTER — Other Ambulatory Visit: Payer: Self-pay

## 2019-12-24 ENCOUNTER — Ambulatory Visit (INDEPENDENT_AMBULATORY_CARE_PROVIDER_SITE_OTHER): Payer: BLUE CROSS/BLUE SHIELD | Admitting: Internal Medicine

## 2019-12-24 DIAGNOSIS — R739 Hyperglycemia, unspecified: Secondary | ICD-10-CM | POA: Diagnosis not present

## 2019-12-24 DIAGNOSIS — F419 Anxiety disorder, unspecified: Secondary | ICD-10-CM | POA: Diagnosis not present

## 2019-12-24 DIAGNOSIS — E7800 Pure hypercholesterolemia, unspecified: Secondary | ICD-10-CM | POA: Diagnosis not present

## 2019-12-24 DIAGNOSIS — F329 Major depressive disorder, single episode, unspecified: Secondary | ICD-10-CM

## 2019-12-24 DIAGNOSIS — F32A Depression, unspecified: Secondary | ICD-10-CM

## 2019-12-24 MED ORDER — HYDROXYZINE HCL 10 MG PO TABS: 10.0000 mg | ORAL_TABLET | Freq: Every evening | ORAL | 1 refills | Status: DC | PRN

## 2019-12-24 NOTE — Progress Notes (Signed)
Patient ID: Tracy Phillips, female   DOB: 1965/11/27, 54 y.o.   MRN: [id number]   Virtual Visit via telephone Note  This visit type was conducted due to national recommendations for restrictions regarding the COVID-19 pandemic (e.g. social distancing).  This format is felt to be most appropriate for this patient at this time.  All issues noted in this document were discussed and addressed.  No physical exam was performed (except for noted visual exam findings with Video Visits).   I connected with Deboraha Sprang by telephone and verified that I am speaking with the correct person using two identifiers. Location patient: home Location provider: work  Persons participating in the telephone visit: patient, provider  I discussed the limitations, risks, security and privacy concerns of performing an evaluation and management service by telephone and the availability of in person appointments. The patient expressed understanding and agreed to proceed.   Reason for visit: scheduled follow up.   HPI: Still with increased stress.  Family and work stress.  Discussed with her today.  Did not feel buspar worked.  Still needs something.  Taking effexor.  Feels this is working well.  Discussed possible treatment options.  Tries to stay active.  No chest congestion, chest pain or sob reported.  No acid reflux, abdominal pain or bowel change reported.  Not drinking.  Participates - AA.    ROS: See pertinent positives and negatives per HPI.  Past Medical History:  Diagnosis Date  . Depression   . H/O dizziness   . H/O eating disorder   . H/O: alcohol abuse   . Herpes    H/O  . History of chicken pox   . History of colon polyps   . Hyperlipidemia   . Hypertension   . Kidney stones    H/O stones    Past Surgical History:  Procedure Laterality Date  . ABDOMINAL HYSTERECTOMY  1998  . APPENDECTOMY  1997  . DILATION AND CURETTAGE OF UTERUS    . TONSILLECTOMY  1997    Family History  Problem  Relation Age of Onset  . Hyperlipidemia Mother   . Hypertension Mother   . Breast cancer Mother 62  . Arthritis Father   . Hyperlipidemia Father   . Hypertension Father   . Diabetes Father   . Hypertension Brother   . Arthritis Maternal Aunt   . Hyperlipidemia Maternal Aunt   . Hypertension Maternal Aunt   . Alcohol abuse Maternal Uncle   . Arthritis Maternal Uncle   . Hyperlipidemia Maternal Uncle   . Hypertension Maternal Uncle   . Arthritis Paternal Aunt   . Hyperlipidemia Paternal Aunt   . Hypertension Paternal Aunt   . Arthritis Paternal Uncle   . Hyperlipidemia Paternal Uncle   . Hypertension Paternal Uncle   . Arthritis Maternal Grandmother   . Hyperlipidemia Maternal Grandmother   . Hypertension Maternal Grandmother   . Arthritis Maternal Grandfather   . Lung cancer Maternal Grandfather   . Prostate cancer Maternal Grandfather   . Hyperlipidemia Maternal Grandfather   . Stroke Maternal Grandfather   . Hypertension Maternal Grandfather   . Arthritis Paternal Grandmother   . Hyperlipidemia Paternal Grandmother   . Hypertension Paternal Grandmother   . Alcohol abuse Paternal Grandfather   . Arthritis Paternal Grandfather   . Hyperlipidemia Paternal Grandfather   . Hypertension Paternal Grandfather   . Sudden death Paternal Grandfather     SOCIAL HX: reviewed.    Current Outpatient Medications:  .  acyclovir (  ZOVIRAX) 400 MG tablet, Take 1 tablet (400 mg total) by mouth 2 (two) times daily., Disp: 60 tablet, Rfl: 4 .  hydrOXYzine (ATARAX/VISTARIL) 10 MG tablet, Take 1 tablet (10 mg total) by mouth at bedtime as needed., Disp: 30 tablet, Rfl: 1 .  venlafaxine XR (EFFEXOR-XR) 75 MG 24 hr capsule, TAKE 1 CAPSULE (75 MG TOTAL) BY MOUTH DAILY WITH BREAKFAST., Disp: 90 capsule, Rfl: 1  EXAM:  GENERAL: alert.  Sounds to be in no acute distress.  Answering questions appropriately.    PSYCH/NEURO: pleasant and cooperative, no obvious depression or anxiety, speech and  thought processing grossly intact  ASSESSMENT AND PLAN:  Discussed the following assessment and plan:  Anxiety Increased stress and anxiety as outlined.  Did not feel buspar helped.  On effexor.  Add hydroxyzine.  Follow closely.  Call with update.    Depression On effexor.  Overall doing well.  Follow.  Add hydroxyzine to take at night.  Follow.    Hypercholesterolemia Low cholesterol diet and exercise.  Follow lipid panel.    Hyperglycemia Low carb diet and exercise.  Follow met b and a1c.     Meds ordered this encounter  Medications  . hydrOXYzine (ATARAX/VISTARIL) 10 MG tablet    Sig: Take 1 tablet (10 mg total) by mouth at bedtime as needed.    Dispense:  30 tablet    Refill:  1     I discussed the assessment and treatment plan with the patient. The patient was provided an opportunity to ask questions and all were answered. The patient agreed with the plan and demonstrated an understanding of the instructions.   The patient was advised to call back or seek an in-person evaluation if the symptoms worsen or if the condition fails to improve as anticipated.  I provided 22 minutes of non-face-to-face time during this encounter.   Einar Pheasant, MD

## 2019-12-26 ENCOUNTER — Encounter: Payer: Self-pay | Admitting: Internal Medicine

## 2019-12-26 NOTE — Assessment & Plan Note (Signed)
On effexor.  Overall doing well.  Follow.  Add hydroxyzine to take at night.  Follow.

## 2019-12-26 NOTE — Assessment & Plan Note (Signed)
Increased stress and anxiety as outlined.  Did not feel buspar helped.  On effexor.  Add hydroxyzine.  Follow closely.  Call with update.

## 2019-12-26 NOTE — Assessment & Plan Note (Signed)
Low carb diet and exercise.  Follow met b and a1c.  

## 2019-12-26 NOTE — Assessment & Plan Note (Signed)
Low cholesterol diet and exercise.  Follow lipid panel.   

## 2020-01-16 ENCOUNTER — Other Ambulatory Visit: Payer: Self-pay | Admitting: Internal Medicine

## 2020-02-17 ENCOUNTER — Telehealth: Payer: Self-pay | Admitting: Internal Medicine

## 2020-02-17 MED ORDER — TRAZODONE HCL 50 MG PO TABS: 25.0000 mg | ORAL_TABLET | Freq: Every evening | ORAL | 1 refills | Status: DC | PRN

## 2020-02-17 NOTE — Telephone Encounter (Signed)
Left detailed message for patient.

## 2020-02-17 NOTE — Telephone Encounter (Signed)
I sent in rx for trazodone.  She is also on effexor.  The effexor should help with anxiety and trazodone help with sleep.  Can adjust dose if needed.  Start with 1/2 tablet q hs and can increase to 1 q hs if needed.

## 2020-02-17 NOTE — Telephone Encounter (Signed)
Vistaril is not working. She is still having anxiety and not being able to sleep. Pt said if you want to think about it and give her a call back on something else she could try? She said she didn't get the vistaril refilled.

## 2020-02-17 NOTE — Telephone Encounter (Signed)
Pt is requesting something different to replace the vistaril. Advised she would need a visit to discuss. Has appt on 4/7. Advised that we do not have any work in appts today. Pt stated that you have been working with her on this for the last couple of visits. Advised that typically medication for anxiety is not sent in with out a visit and we are closed tomorrow. Patient stated that whatever she has to do is fine but she does not want to go to urgent care and would like to have something before the weekend.

## 2020-02-17 NOTE — Telephone Encounter (Signed)
Reviewed.  She has tried Education administrator.  Would she be agreeable to try trazodone.  If agreeable, I can send in.  If she has tried this before and had problems then  let her know that I would like to contact psychiatry and talk with them about other good treatment options.  I can let her know their recommendation, but I don't know that I would hear back from them today.

## 2020-02-17 NOTE — Telephone Encounter (Signed)
Pt called back  Please call her at [phone]

## 2020-02-17 NOTE — Telephone Encounter (Signed)
Patient would like to try trazodone. She said that she thinks she tried it in the past and didn't help much with anxiety but would like to try again if you recommend. Stated she needs something before the long weekend so if trazodone is her only option she would like to try that.

## 2020-02-23 ENCOUNTER — Telehealth (INDEPENDENT_AMBULATORY_CARE_PROVIDER_SITE_OTHER): Payer: BLUE CROSS/BLUE SHIELD | Admitting: Internal Medicine

## 2020-02-23 ENCOUNTER — Encounter: Payer: Self-pay | Admitting: Internal Medicine

## 2020-02-23 DIAGNOSIS — F419 Anxiety disorder, unspecified: Secondary | ICD-10-CM

## 2020-02-23 DIAGNOSIS — E7800 Pure hypercholesterolemia, unspecified: Secondary | ICD-10-CM

## 2020-02-23 DIAGNOSIS — F329 Major depressive disorder, single episode, unspecified: Secondary | ICD-10-CM

## 2020-02-23 DIAGNOSIS — F32A Depression, unspecified: Secondary | ICD-10-CM

## 2020-02-23 MED ORDER — TRAZODONE HCL 50 MG PO TABS: ORAL_TABLET | ORAL | 1 refills | Status: DC

## 2020-02-23 NOTE — Progress Notes (Signed)
Virtual Visit via telephone Note  This visit type was conducted due to national recommendations for restrictions regarding the COVID-19 pandemic (e.g. social distancing).  This format is felt to be most appropriate for this patient at this time.  All issues noted in this document were discussed and addressed.  No physical exam was performed (except for noted visual exam findings with Video Visits).   I connected with Tracy Phillips by telephone and verified that I am speaking with the correct person using two identifiers. Location patient: home Location provider: work Persons participating in the telephone visit: patient, provider  The limitations, risks, security and privacy concerns of performing an evaluation and management service by telephone and the availability of in person appointments have been discussed.  The patient expressed understanding and agreed to proceed.   Reason for visit: scheduled follow up.    HPI: Has been having problems with increased anxiety.  Some family stress recently.  Also, her dog passed away.  Taking effexor.  Not sleeping well.  Did not feel the vistaril helped.  Discussed trazodone and increasing the dose.  She is tolerating.  No chest pain or sob reported.  Some decreased appetite. No vomiting.  No abdominal pain reported.  Bowels stable.      ROS: See pertinent positives and negatives per HPI.  Past Medical History:  Diagnosis Date  . Depression   . H/O dizziness   . H/O eating disorder   . H/O: alcohol abuse   . Herpes    H/O  . History of chicken pox   . History of colon polyps   . Hyperlipidemia   . Hypertension   . Kidney stones    H/O stones    Past Surgical History:  Procedure Laterality Date  . ABDOMINAL HYSTERECTOMY  1998  . APPENDECTOMY  1997  . DILATION AND CURETTAGE OF UTERUS    . TONSILLECTOMY  1997    Family History  Problem Relation Age of Onset  . Hyperlipidemia Mother   . Hypertension Mother   . Breast cancer  Mother 46  . Arthritis Father   . Hyperlipidemia Father   . Hypertension Father   . Diabetes Father   . Hypertension Brother   . Arthritis Maternal Aunt   . Hyperlipidemia Maternal Aunt   . Hypertension Maternal Aunt   . Alcohol abuse Maternal Uncle   . Arthritis Maternal Uncle   . Hyperlipidemia Maternal Uncle   . Hypertension Maternal Uncle   . Arthritis Paternal Aunt   . Hyperlipidemia Paternal Aunt   . Hypertension Paternal Aunt   . Arthritis Paternal Uncle   . Hyperlipidemia Paternal Uncle   . Hypertension Paternal Uncle   . Arthritis Maternal Grandmother   . Hyperlipidemia Maternal Grandmother   . Hypertension Maternal Grandmother   . Arthritis Maternal Grandfather   . Lung cancer Maternal Grandfather   . Prostate cancer Maternal Grandfather   . Hyperlipidemia Maternal Grandfather   . Stroke Maternal Grandfather   . Hypertension Maternal Grandfather   . Arthritis Paternal Grandmother   . Hyperlipidemia Paternal Grandmother   . Hypertension Paternal Grandmother   . Alcohol abuse Paternal Grandfather   . Arthritis Paternal Grandfather   . Hyperlipidemia Paternal Grandfather   . Hypertension Paternal Grandfather   . Sudden death Paternal Grandfather     SOCIAL HX: reviewed.    Current Outpatient Medications:  .  acyclovir (ZOVIRAX) 400 MG tablet, TAKE 1 TABLET BY MOUTH TWICE A DAY, Disp: 60 tablet, Rfl: 4 .  traZODone (DESYREL) 50 MG tablet, Take 1-2 tablets q hs, Disp: 60 tablet, Rfl: 1 .  venlafaxine XR (EFFEXOR-XR) 75 MG 24 hr capsule, TAKE 1 CAPSULE (75 MG TOTAL) BY MOUTH DAILY WITH BREAKFAST., Disp: 90 capsule, Rfl: 1  EXAM:  GENERAL: alert.  Answering questions appropriately.  Sounds to be in no acute distress.    PSYCH/NEURO: pleasant and cooperative, no obvious depression or anxiety, speech and thought processing grossly intact  ASSESSMENT AND PLAN:  Discussed the following assessment and plan:  Anxiety Increased stress as outlined.  On effexor.   Continue current dose.  Adjust trazodone to help with sleep.  Follow    Depression On effexor.  Increase trazodone dose to help with sleep.  Follow.    Hypercholesterolemia Low cholesterol diet and exercise.  Follow lipid panel.    Meds ordered this encounter  Medications  . traZODone (DESYREL) 50 MG tablet    Sig: Take 1-2 tablets q hs    Dispense:  60 tablet    Refill:  1     I discussed the assessment and treatment plan with the patient. The patient was provided an opportunity to ask questions and all were answered. The patient agreed with the plan and demonstrated an understanding of the instructions.   The patient was advised to call back or seek an in-person evaluation if the symptoms worsen or if the condition fails to improve as anticipated.  I provided 22 minutes of non-face-to-face time during this encounter.   Einar Pheasant, MD

## 2020-02-24 ENCOUNTER — Telehealth: Payer: Self-pay | Admitting: Internal Medicine

## 2020-02-24 NOTE — Telephone Encounter (Signed)
LMTCB and schedule a fasting lab in 2-3 weeks and a 83m follow up

## 2020-02-25 ENCOUNTER — Other Ambulatory Visit: Payer: Self-pay | Admitting: Internal Medicine

## 2020-03-04 ENCOUNTER — Encounter: Payer: Self-pay | Admitting: Internal Medicine

## 2020-03-04 NOTE — Assessment & Plan Note (Signed)
Low cholesterol diet and exercise.  Follow lipid panel.   

## 2020-03-04 NOTE — Assessment & Plan Note (Signed)
On effexor.  Increase trazodone dose to help with sleep.  Follow.

## 2020-03-04 NOTE — Assessment & Plan Note (Signed)
Increased stress as outlined.  On effexor.  Continue current dose.  Adjust trazodone to help with sleep.  Follow

## 2020-03-15 ENCOUNTER — Other Ambulatory Visit: Payer: Self-pay | Admitting: Internal Medicine

## 2020-03-16 ENCOUNTER — Other Ambulatory Visit: Payer: BLUE CROSS/BLUE SHIELD

## 2020-04-25 ENCOUNTER — Ambulatory Visit: Payer: BLUE CROSS/BLUE SHIELD | Admitting: Internal Medicine

## 2020-06-26 ENCOUNTER — Telehealth: Payer: Self-pay | Admitting: Internal Medicine

## 2020-06-26 DIAGNOSIS — Z1231 Encounter for screening mammogram for malignant neoplasm of breast: Secondary | ICD-10-CM

## 2020-06-26 NOTE — Telephone Encounter (Signed)
I received notice that pt is overdue mammogram.  (last one I have is 2018).  Need to schedule.  I have placed order for screening mammogram at [facility].

## 2020-06-29 ENCOUNTER — Telehealth: Payer: Self-pay

## 2020-06-29 NOTE — Telephone Encounter (Signed)
I called and left a detailed message on VM for the patient to call and schedule her mammogram with [facility]. I left the number to cal and schedule.  Trueman Worlds,cma

## 2020-08-11 ENCOUNTER — Encounter: Payer: Self-pay | Admitting: Internal Medicine

## 2020-08-18 NOTE — Telephone Encounter (Signed)
LVM informing the patient that her mammogram was ordered and she can call and schedule.  Makita Blow,cma

## 2020-09-26 ENCOUNTER — Other Ambulatory Visit: Payer: Self-pay | Admitting: Internal Medicine

## 2020-12-30 ENCOUNTER — Other Ambulatory Visit: Payer: Self-pay | Admitting: Internal Medicine

## 2021-03-24 ENCOUNTER — Other Ambulatory Visit: Payer: Self-pay | Admitting: Internal Medicine

## 2021-06-13 ENCOUNTER — Other Ambulatory Visit: Payer: Self-pay | Admitting: Internal Medicine

## 2021-06-21 ENCOUNTER — Telehealth: Payer: Self-pay | Admitting: Internal Medicine

## 2021-06-21 NOTE — Telephone Encounter (Signed)
She is overdue a physical.  Please schedule.  Thanks.   

## 2021-06-21 NOTE — Telephone Encounter (Signed)
Unable to lm to schedule a CPE

## 2021-07-01 ENCOUNTER — Telehealth: Payer: Self-pay | Admitting: Internal Medicine

## 2021-07-01 NOTE — Telephone Encounter (Signed)
Please call and notify pt she is overdue a mammogram.  I received notice she is overdue.

## 2021-07-02 NOTE — Telephone Encounter (Signed)
LM letting pt know she needs to schedule her mammogram

## 2021-10-26 ENCOUNTER — Other Ambulatory Visit: Payer: Self-pay | Admitting: Internal Medicine

## 2022-01-08 ENCOUNTER — Emergency Department
Admission: EM | Admit: 2022-01-08 | Discharge: 2022-01-08 | Discharge status: Against Medical Advice | Payer: BC Managed Care – PPO

## 2022-01-08 NOTE — ED Notes (Signed)
Patient brought in by [employer]. Pt was in [place] and being charged with DWI. Pt was taken to the jail but they refused to take her unless police had a written refusal from pt stating that she did not want to be evaluated for medical treatment.   Pt verbalized to this RN that she does not wish to seek medical attention. Pt signed printed copy of AMA form.

## 2022-05-10 ENCOUNTER — Telehealth: Payer: Self-pay

## 2022-05-15 ENCOUNTER — Other Ambulatory Visit: Payer: Self-pay | Admitting: Internal Medicine

## 2022-05-15 ENCOUNTER — Telehealth: Payer: Self-pay

## 2022-05-15 NOTE — Telephone Encounter (Signed)
I have refilled until patient is seen.

## 2022-05-15 NOTE — Telephone Encounter (Addendum)
Patient states she is returning Donney Dice, CMA's call.  I read Sarah's message for patient.  I scheduled patient for an appointment with Dr. Dale Emmet on 06/28/2022.  Patient states she will probably need refills for two of her medications before that appointment.  traZODone (DESYREL) 50 MG tablet  venlafaxine XR (EFFEXOR-XR) 75 MG 24 hr capsule

## 2022-05-15 NOTE — Telephone Encounter (Signed)
LMTCB patient not seen since 2021.

## 2022-05-29 ENCOUNTER — Other Ambulatory Visit: Payer: Self-pay | Admitting: Internal Medicine

## 2022-06-28 ENCOUNTER — Ambulatory Visit: Payer: BC Managed Care – PPO | Admitting: Internal Medicine

## 2022-07-13 ENCOUNTER — Other Ambulatory Visit: Payer: Self-pay | Admitting: Internal Medicine

## 2022-07-24 ENCOUNTER — Ambulatory Visit: Payer: BC Managed Care – PPO | Admitting: Internal Medicine

## 2022-08-08 ENCOUNTER — Ambulatory Visit: Payer: BC Managed Care – PPO | Admitting: Internal Medicine

## 2023-01-19 ENCOUNTER — Other Ambulatory Visit: Payer: Self-pay | Admitting: Internal Medicine

## 2023-02-21 ENCOUNTER — Other Ambulatory Visit: Payer: Self-pay | Admitting: Internal Medicine

## 2023-02-21 NOTE — Telephone Encounter (Signed)
Spoke to [employer].  She has enough medication to get her through until her appt.  Increased anxiety and depression.  Worsened recently.  No current SI.  Discussed psychology and psychiatry referrals.  She is open to referral. She wants me to check and see if insurance covers.  Will check when office opens.

## 2023-02-21 NOTE — Telephone Encounter (Signed)
Pt returned call. Note below was read to her. I booked pt for 4/10 with provider.

## 2023-02-21 NOTE — Telephone Encounter (Signed)
Was filled in June for 6 months and patient was scheduled but did not keep appt. Called and left message to call office needs appt and must keep to be filled .

## 2023-02-24 ENCOUNTER — Telehealth: Payer: Self-pay | Admitting: Internal Medicine

## 2023-02-24 NOTE — Telephone Encounter (Signed)
Pt would like to be called regarding a personal matter and she did not want to tell me what it was

## 2023-02-24 NOTE — Telephone Encounter (Signed)
FYI-   Called patient to see what she needed to discuss. She is having reservations about coming in on Wednesday. Also having reservations about seeing psych. Doesn't think she is ready to do that. Patient confirmed no SI. Agreed to keep appt on Wednesday. Advised we could discuss psych referral further at her appt. Pt thanked me for calling and says that she will see you Wednesday.

## 2023-02-26 ENCOUNTER — Ambulatory Visit (INDEPENDENT_AMBULATORY_CARE_PROVIDER_SITE_OTHER): Payer: BC Managed Care – PPO | Admitting: Internal Medicine

## 2023-02-26 DIAGNOSIS — E7800 Pure hypercholesterolemia, unspecified: Secondary | ICD-10-CM

## 2023-02-26 NOTE — Progress Notes (Deleted)
Subjective:    Patient ID: Tracy Phillips, female    DOB: 1966/08/07, 57 y.o.   MRN: [id number]  Patient here for No chief complaint on file.   HPI Here for a scheduled follow up.  Have not seen since 2021.  Here to follow up regarding anxiety/depression.  Has been on effexor and trazodone.     Past Medical History:  Diagnosis Date   Depression    H/O dizziness    H/O eating disorder    H/O: alcohol abuse    Herpes    H/O   History of chicken pox    History of colon polyps    Hyperlipidemia    Hypertension    Kidney stones    H/O stones   Past Surgical History:  Procedure Laterality Date   ABDOMINAL HYSTERECTOMY  1998   APPENDECTOMY  1997   DILATION AND CURETTAGE OF UTERUS     TONSILLECTOMY  1997   Family History  Problem Relation Age of Onset   Hyperlipidemia Mother    Hypertension Mother    Breast cancer Mother 58   Arthritis Father    Hyperlipidemia Father    Hypertension Father    Diabetes Father    Hypertension Brother    Arthritis Maternal Aunt    Hyperlipidemia Maternal Aunt    Hypertension Maternal Aunt    Alcohol abuse Maternal Uncle    Arthritis Maternal Uncle    Hyperlipidemia Maternal Uncle    Hypertension Maternal Uncle    Arthritis Paternal Aunt    Hyperlipidemia Paternal Aunt    Hypertension Paternal Aunt    Arthritis Paternal Uncle    Hyperlipidemia Paternal Uncle    Hypertension Paternal Uncle    Arthritis Maternal Grandmother    Hyperlipidemia Maternal Grandmother    Hypertension Maternal Grandmother    Arthritis Maternal Grandfather    Lung cancer Maternal Grandfather    Prostate cancer Maternal Grandfather    Hyperlipidemia Maternal Grandfather    Stroke Maternal Grandfather    Hypertension Maternal Grandfather    Arthritis Paternal Grandmother    Hyperlipidemia Paternal Grandmother    Hypertension Paternal Grandmother    Alcohol abuse Paternal Grandfather    Arthritis Paternal Grandfather    Hyperlipidemia Paternal  Grandfather    Hypertension Paternal Grandfather    Sudden death Paternal Grandfather    Social History   Socioeconomic History   Marital status: Single    Spouse name: Not on file   Number of children: Not on file   Years of education: Not on file   Highest education level: Not on file  Occupational History   Not on file  Tobacco Use   Smoking status: Never   Smokeless tobacco: Never  Substance and Sexual Activity   Alcohol use: No    Comment: sober since 10/27/2010   Drug use: No   Sexual activity: Not on file  Other Topics Concern   Not on file  Social History Narrative   Not on file   Social Determinants of Health   Financial Resource Strain: Not on file  Food Insecurity: Not on file  Transportation Needs: Not on file  Physical Activity: Not on file  Stress: Not on file  Social Connections: Not on file     Review of Systems     Objective:     There were no vitals taken for this visit. Wt Readings from Last 3 Encounters:  02/23/20 135 lb (61.2 kg)  12/24/19 133 lb (60.3 kg)  11/05/19  139 lb (63 kg)    Physical Exam   Outpatient Encounter Medications as of 02/26/2023  Medication Sig   acyclovir (ZOVIRAX) 400 MG tablet TAKE 1 TABLET BY MOUTH TWICE A DAY   traZODone (DESYREL) 50 MG tablet TAKE 1-2 TABLETS EVERY NIGHT   venlafaxine XR (EFFEXOR-XR) 75 MG 24 hr capsule TAKE 1 CAPSULE BY MOUTH DAILY WITH BREAKFAST.   No facility-administered encounter medications on file as of 02/26/2023.     Lab Results  Component Value Date   WBC 5.5 05/13/2019   HGB 14.2 05/13/2019   HCT 42.4 05/13/2019   PLT 218.0 05/13/2019   GLUCOSE 100 (H) 05/13/2019   CHOL 241 (H) 05/13/2019   TRIG 309.0 (H) 05/13/2019   HDL 45.10 05/13/2019   LDLDIRECT 132.0 05/13/2019   ALT 15 05/13/2019   AST 14 05/13/2019   NA 142 05/13/2019   K 4.2 05/13/2019   CL 102 05/13/2019   CREATININE 0.71 05/13/2019   BUN 17 05/13/2019   CO2 33 (H) 05/13/2019   TSH 2.95 05/13/2019    HGBA1C 5.9 05/13/2019    No results found.     Assessment & Plan:  There are no diagnoses linked to this encounter.   Dale Isabela, MD

## 2023-03-02 ENCOUNTER — Encounter: Payer: Self-pay | Admitting: Internal Medicine

## 2023-03-02 NOTE — Progress Notes (Signed)
Patient ID: Tracy Phillips, female   DOB: 08/04/1966, 57 y.o.   MRN: [id number] Did not show for appt.

## 2023-04-03 ENCOUNTER — Other Ambulatory Visit: Payer: Self-pay

## 2023-04-03 ENCOUNTER — Inpatient Hospital Stay
Admission: EM | Admit: 2023-04-03 | Discharge: 2023-04-06 | DRG: 897 | Disposition: A | Discharge status: Other Acute Inpatient Hospital | Payer: BC Managed Care – PPO | Attending: Internal Medicine | Admitting: Internal Medicine

## 2023-04-03 DIAGNOSIS — F10229 Alcohol dependence with intoxication, unspecified: Secondary | ICD-10-CM | POA: Diagnosis not present

## 2023-04-03 DIAGNOSIS — F419 Anxiety disorder, unspecified: Secondary | ICD-10-CM | POA: Diagnosis present

## 2023-04-03 DIAGNOSIS — E785 Hyperlipidemia, unspecified: Secondary | ICD-10-CM | POA: Diagnosis present

## 2023-04-03 DIAGNOSIS — Z801 Family history of malignant neoplasm of trachea, bronchus and lung: Secondary | ICD-10-CM

## 2023-04-03 DIAGNOSIS — I10 Essential (primary) hypertension: Secondary | ICD-10-CM | POA: Diagnosis not present

## 2023-04-03 DIAGNOSIS — Z83438 Family history of other disorder of lipoprotein metabolism and other lipidemia: Secondary | ICD-10-CM

## 2023-04-03 DIAGNOSIS — F1010 Alcohol abuse, uncomplicated: Secondary | ICD-10-CM | POA: Diagnosis present

## 2023-04-03 DIAGNOSIS — Y908 Blood alcohol level of 240 mg/100 ml or more: Secondary | ICD-10-CM | POA: Diagnosis present

## 2023-04-03 DIAGNOSIS — Z811 Family history of alcohol abuse and dependence: Secondary | ICD-10-CM

## 2023-04-03 DIAGNOSIS — Z634 Disappearance and death of family member: Secondary | ICD-10-CM | POA: Diagnosis not present

## 2023-04-03 DIAGNOSIS — F10939 Alcohol use, unspecified with withdrawal, unspecified: Secondary | ICD-10-CM | POA: Diagnosis present

## 2023-04-03 DIAGNOSIS — Z833 Family history of diabetes mellitus: Secondary | ICD-10-CM | POA: Diagnosis not present

## 2023-04-03 DIAGNOSIS — F10231 Alcohol dependence with withdrawal delirium: Secondary | ICD-10-CM | POA: Diagnosis not present

## 2023-04-03 DIAGNOSIS — Z886 Allergy status to analgesic agent status: Secondary | ICD-10-CM

## 2023-04-03 DIAGNOSIS — R45851 Suicidal ideations: Secondary | ICD-10-CM | POA: Diagnosis present

## 2023-04-03 DIAGNOSIS — F332 Major depressive disorder, recurrent severe without psychotic features: Secondary | ICD-10-CM | POA: Diagnosis not present

## 2023-04-03 DIAGNOSIS — R000 Tachycardia, unspecified: Secondary | ICD-10-CM | POA: Diagnosis not present

## 2023-04-03 DIAGNOSIS — F32A Depression, unspecified: Secondary | ICD-10-CM | POA: Diagnosis present

## 2023-04-03 DIAGNOSIS — Z8249 Family history of ischemic heart disease and other diseases of the circulatory system: Secondary | ICD-10-CM

## 2023-04-03 DIAGNOSIS — Z823 Family history of stroke: Secondary | ICD-10-CM

## 2023-04-03 DIAGNOSIS — F10929 Alcohol use, unspecified with intoxication, unspecified: Principal | ICD-10-CM

## 2023-04-03 DIAGNOSIS — Z803 Family history of malignant neoplasm of breast: Secondary | ICD-10-CM | POA: Diagnosis not present

## 2023-04-03 DIAGNOSIS — F10931 Alcohol use, unspecified with withdrawal delirium: Secondary | ICD-10-CM | POA: Diagnosis present

## 2023-04-03 DIAGNOSIS — Z79899 Other long term (current) drug therapy: Secondary | ICD-10-CM | POA: Diagnosis not present

## 2023-04-03 DIAGNOSIS — F10129 Alcohol abuse with intoxication, unspecified: Secondary | ICD-10-CM | POA: Diagnosis not present

## 2023-04-03 DIAGNOSIS — Z8261 Family history of arthritis: Secondary | ICD-10-CM | POA: Diagnosis not present

## 2023-04-03 LAB — CBC
HCT: 45.1 % (ref 36.0–46.0)
Hemoglobin: 15.1 g/dL — ABNORMAL HIGH (ref 12.0–15.0)
MCH: 29.4 pg (ref 26.0–34.0)
MCHC: 33.5 g/dL (ref 30.0–36.0)
MCV: 87.7 fL (ref 80.0–100.0)
Platelets: 282 10*3/uL (ref 150–400)
RBC: 5.14 MIL/uL — ABNORMAL HIGH (ref 3.87–5.11)
RDW: 14.1 % (ref 11.5–15.5)
WBC: 4.3 10*3/uL (ref 4.0–10.5)
nRBC: 0 % (ref 0.0–0.2)

## 2023-04-03 LAB — COMPREHENSIVE METABOLIC PANEL
ALT: 23 U/L (ref 0–44)
AST: 32 U/L (ref 15–41)
Albumin: 4.2 g/dL (ref 3.5–5.0)
Alkaline Phosphatase: 50 U/L (ref 38–126)
Anion gap: 12 (ref 5–15)
BUN: 11 mg/dL (ref 6–20)
CO2: 25 mmol/L (ref 22–32)
Calcium: 8 mg/dL — ABNORMAL LOW (ref 8.9–10.3)
Chloride: 99 mmol/L (ref 98–111)
Creatinine, Ser: 0.6 mg/dL (ref 0.44–1.00)
GFR, Estimated: >60 mL/min (ref >60)
Glucose, Bld: 140 mg/dL — ABNORMAL HIGH (ref 70–99)
Potassium: 3.7 mmol/L (ref 3.5–5.1)
Sodium: 136 mmol/L (ref 135–145)
Total Bilirubin: 0.7 mg/dL (ref 0.3–1.2)
Total Protein: 7.4 g/dL (ref 6.5–8.1)

## 2023-04-03 LAB — SALICYLATE LEVEL: Salicylate Lvl: <7 mg/dL — ABNORMAL LOW (ref 7.0–30.0)

## 2023-04-03 LAB — ACETAMINOPHEN LEVEL: Acetaminophen (Tylenol), Serum: <10 ug/mL — ABNORMAL LOW (ref 10–30)

## 2023-04-03 LAB — ETHANOL: Alcohol, Ethyl (B): 307 mg/dL (ref <10)

## 2023-04-03 MED ORDER — TRAZODONE HCL 50 MG PO TABS
50.0000 mg | ORAL_TABLET | Freq: Every evening | ORAL | Status: DC | PRN
  Administered 2023-04-04 (×2): 50 mg via ORAL
  Filled 2023-04-03 (×2): qty 1

## 2023-04-03 MED ORDER — LORAZEPAM 2 MG PO TABS: 0.0000 mg | ORAL_TABLET | Freq: Two times a day (BID) | ORAL | Status: DC

## 2023-04-03 MED ORDER — THIAMINE MONONITRATE 100 MG PO TABS
100.0000 mg | ORAL_TABLET | Freq: Every day | ORAL | Status: DC
  Administered 2023-04-04 – 2023-04-06 (×3): 100 mg via ORAL
  Filled 2023-04-03 (×3): qty 1

## 2023-04-03 MED ORDER — THIAMINE MONONITRATE 100 MG PO TABS
100.0000 mg | ORAL_TABLET | Freq: Every day | ORAL | Status: DC
  Administered 2023-04-03: 100 mg via ORAL
  Filled 2023-04-03: qty 1

## 2023-04-03 MED ORDER — LORAZEPAM 1 MG PO TABS
1.0000 mg | ORAL_TABLET | ORAL | Status: DC | PRN
  Administered 2023-04-05 – 2023-04-06 (×4): 1 mg via ORAL
  Administered 2023-04-06: 2 mg via ORAL
  Administered 2023-04-06: 1 mg via ORAL
  Filled 2023-04-03 (×3): qty 1
  Filled 2023-04-03: qty 2
  Filled 2023-04-03 (×3): qty 1

## 2023-04-03 MED ORDER — THIAMINE HCL 100 MG/ML IJ SOLN
100.0000 mg | Freq: Every day | INTRAMUSCULAR | Status: DC
Start: 2023-04-03 — End: 2023-04-03

## 2023-04-03 MED ORDER — LORAZEPAM 1 MG PO TABS
1.0000 mg | ORAL_TABLET | ORAL | Status: DC | PRN
Start: 2023-04-03 — End: 2023-04-03

## 2023-04-03 MED ORDER — LORAZEPAM 2 MG/ML IJ SOLN
1.0000 mg | INTRAMUSCULAR | Status: DC | PRN
  Administered 2023-04-03: 3 mg via INTRAVENOUS
  Administered 2023-04-03: 2 mg via INTRAVENOUS
  Administered 2023-04-04: 1 mg via INTRAVENOUS
  Administered 2023-04-04 (×2): 3 mg via INTRAVENOUS
  Administered 2023-04-04 – 2023-04-05 (×6): 2 mg via INTRAVENOUS
  Administered 2023-04-05 – 2023-04-06 (×5): 3 mg via INTRAVENOUS
  Filled 2023-04-03: qty 1
  Filled 2023-04-03: qty 2
  Filled 2023-04-03: qty 1
  Filled 2023-04-03: qty 2
  Filled 2023-04-03: qty 1
  Filled 2023-04-03: qty 2
  Filled 2023-04-03: qty 1
  Filled 2023-04-03: qty 2
  Filled 2023-04-03 (×2): qty 1
  Filled 2023-04-03: qty 2
  Filled 2023-04-03: qty 1
  Filled 2023-04-03: qty 2

## 2023-04-03 MED ORDER — LORAZEPAM 2 MG/ML IJ SOLN
0.0000 mg | Freq: Two times a day (BID) | INTRAMUSCULAR | Status: DC
  Administered 2023-04-05: 1 mg via INTRAVENOUS
  Filled 2023-04-03: qty 1

## 2023-04-03 MED ORDER — ADULT MULTIVITAMIN W/MINERALS CH
1.0000 | ORAL_TABLET | Freq: Every day | ORAL | Status: DC
  Administered 2023-04-03: 1 via ORAL
  Filled 2023-04-03: qty 1

## 2023-04-03 MED ORDER — FOLIC ACID 1 MG PO TABS
1.0000 mg | ORAL_TABLET | Freq: Every day | ORAL | Status: DC
  Administered 2023-04-03 – 2023-04-06 (×4): 1 mg via ORAL
  Filled 2023-04-03 (×4): qty 1

## 2023-04-03 MED ORDER — FOLIC ACID 1 MG PO TABS
1.0000 mg | ORAL_TABLET | Freq: Every day | ORAL | Status: DC
Start: 2023-04-03 — End: 2023-04-03

## 2023-04-03 MED ORDER — LORAZEPAM 2 MG PO TABS
0.0000 mg | ORAL_TABLET | Freq: Four times a day (QID) | ORAL | Status: DC
  Administered 2023-04-03: 3 mg via ORAL
  Filled 2023-04-03 (×2): qty 2

## 2023-04-03 MED ORDER — ADULT MULTIVITAMIN W/MINERALS CH
1.0000 | ORAL_TABLET | Freq: Every day | ORAL | Status: DC
  Administered 2023-04-04 – 2023-04-06 (×3): 1 via ORAL
  Filled 2023-04-03 (×3): qty 1

## 2023-04-03 MED ORDER — THIAMINE HCL 100 MG/ML IJ SOLN: 100.0000 mg | Freq: Every day | INTRAMUSCULAR | Status: DC

## 2023-04-03 MED ORDER — LORAZEPAM 2 MG/ML IJ SOLN
0.0000 mg | Freq: Four times a day (QID) | INTRAMUSCULAR | Status: AC
  Administered 2023-04-03 – 2023-04-04 (×4): 2 mg via INTRAVENOUS
  Administered 2023-04-04: 1 mg via INTRAVENOUS
  Administered 2023-04-05: 2 mg via INTRAVENOUS
  Filled 2023-04-03: qty 1
  Filled 2023-04-03: qty 2
  Filled 2023-04-03 (×4): qty 1
  Filled 2023-04-03: qty 2
  Filled 2023-04-03: qty 1

## 2023-04-03 MED ORDER — DROPERIDOL 2.5 MG/ML IJ SOLN
2.5000 mg | Freq: Once | INTRAMUSCULAR | Status: AC
  Administered 2023-04-03: 2.5 mg via INTRAVENOUS
  Filled 2023-04-03: qty 2

## 2023-04-03 MED ORDER — ONDANSETRON 4 MG PO TBDP
4.0000 mg | ORAL_TABLET | Freq: Once | ORAL | Status: AC
  Administered 2023-04-03: 4 mg via ORAL
  Filled 2023-04-03: qty 1

## 2023-04-03 NOTE — Consult Note (Signed)
[facility] Face-to-Face Psychiatry Consult   Reason for Consult:   Alcohol abuse, depression  Referring Physician:  Loleta Rose, MD Patient Identification: Tracy Phillips MRN:  [id number] Principal Diagnosis: Alcoholic intoxication with complication [facility]) Diagnosis:  Principal Problem:   Alcoholic intoxication with complication (HCC) Active Problems:   Alcohol abuse   Anxiety   Major depressive disorder, recurrent episode, severe (HCC)   Suicidal ideation   Total Time spent with patient: 45 minutes  Subjective:   [name] is a 57 y.o. female patient admitted with "I am recovering from alcohol".  HPI:  Patient seen and chart reviewed. The patient presented to the ED with alcohol intoxication and suicidal ideation.  She reports a recent increase in depression following her father's death a "couple of months ago." She expresses ongoing suicidal thoughts but clarifies that she has no specific plan or intent and has never attempted suicide before. She denies any homicidal ideation, auditory or visual hallucinations, paranoia, or delusional thinking.She describes experiencing a decreased appetite and poor sleep, including difficulty both falling and staying asleep, though she denies having bad dreams or nightmares.  Regarding her medication, patient reports that she was taking Effexor for but has self discontinued it a few weeks ago, though she cannot recall why she stopped.  She expresses a desire to quit drinking and admits to consuming 1 box of wine daily, with the last consumption occurring prior to her hospital admission.  The patient denies any history of alcohol withdrawal symptoms.  The patient reports that she lives alone and identifies her mother as her primary support system.  On assessment, the patient is alert and oriented x 4, appears anxious and depressed, mood is congruent with affect.  Her speech is clear and coherent, she appropriately responds to questions without showing signs  of responding to internal or external stimuli, delusional thinking noted.  UDS pending collection.  BAL 307 on admission.    Past Psychiatric History: Patient reports no history of trauma, abuse, neglect, or previous psychiatric hospitalizations. She reports a history of depression, anxiety, and alcohol use.   Risk to Self:  Yes  Risk to Others:  No  Prior Inpatient Therapy:  None Prior Outpatient Therapy:    Past Medical History:  Past Medical History:  Diagnosis Date   Depression    H/O dizziness    H/O eating disorder    H/O: alcohol abuse    Herpes    H/O   History of chicken pox    History of colon polyps    Hyperlipidemia    Hypertension    Kidney stones    H/O stones    Past Surgical History:  Procedure Laterality Date   ABDOMINAL HYSTERECTOMY  1998   APPENDECTOMY  1997   DILATION AND CURETTAGE OF UTERUS     TONSILLECTOMY  1997   Family History:  Family History  Problem Relation Age of Onset   Hyperlipidemia Mother    Hypertension Mother    Breast cancer Mother 72   Arthritis Father    Hyperlipidemia Father    Hypertension Father    Diabetes Father    Hypertension Brother    Arthritis Maternal Aunt    Hyperlipidemia Maternal Aunt    Hypertension Maternal Aunt    Alcohol abuse Maternal Uncle    Arthritis Maternal Uncle    Hyperlipidemia Maternal Uncle    Hypertension Maternal Uncle    Arthritis Paternal Aunt    Hyperlipidemia Paternal Aunt    Hypertension Paternal Aunt  Arthritis Paternal Uncle    Hyperlipidemia Paternal Uncle    Hypertension Paternal Uncle    Arthritis Maternal Grandmother    Hyperlipidemia Maternal Grandmother    Hypertension Maternal Grandmother    Arthritis Maternal Grandfather    Lung cancer Maternal Grandfather    Prostate cancer Maternal Grandfather    Hyperlipidemia Maternal Grandfather    Stroke Maternal Grandfather    Hypertension Maternal Grandfather    Arthritis Paternal Grandmother    Hyperlipidemia Paternal  Grandmother    Hypertension Paternal Grandmother    Alcohol abuse Paternal Grandfather    Arthritis Paternal Grandfather    Hyperlipidemia Paternal Grandfather    Hypertension Paternal Grandfather    Sudden death Paternal Grandfather    Family Psychiatric  History: Alcohol abuse paternal grandmother and maternal uncle.  Social History:  Social History   Substance and Sexual Activity  Alcohol Use No   Comment: sober since 10/27/2010     Social History   Substance and Sexual Activity  Drug Use No    Social History   Socioeconomic History   Marital status: Single    Spouse name: Not on file   Number of children: Not on file   Years of education: Not on file   Highest education level: Not on file  Occupational History   Not on file  Tobacco Use   Smoking status: Never   Smokeless tobacco: Never  Substance and Sexual Activity   Alcohol use: No    Comment: sober since 10/27/2010   Drug use: No   Sexual activity: Not on file  Other Topics Concern   Not on file  Social History Narrative   Not on file   Social Determinants of Health   Financial Resource Strain: Not on file  Food Insecurity: Not on file  Transportation Needs: Not on file  Physical Activity: Not on file  Stress: Not on file  Social Connections: Not on file   Additional Social History:    Allergies:   Allergies  Allergen Reactions   Aleve [Naproxen Sodium] Hives    Labs:  Results for orders placed or performed during the hospital encounter of 04/03/23 (from the past 48 hour(s))  Comprehensive metabolic panel     Status: Abnormal   Collection Time: 04/03/23  3:21 AM  Result Value Ref Range   Sodium 136 135 - 145 mmol/L   Potassium 3.7 3.5 - 5.1 mmol/L   Chloride 99 98 - 111 mmol/L   CO2 25 22 - 32 mmol/L   Glucose, Bld 140 (H) 70 - 99 mg/dL    Comment: Glucose reference range applies only to samples taken after fasting for at least 8 hours.   BUN 11 6 - 20 mg/dL   Creatinine, Ser 1.61  0.44 - 1.00 mg/dL   Calcium 8.0 (L) 8.9 - 10.3 mg/dL   Total Protein 7.4 6.5 - 8.1 g/dL   Albumin 4.2 3.5 - 5.0 g/dL   AST 32 15 - 41 U/L   ALT 23 0 - 44 U/L   Alkaline Phosphatase 50 38 - 126 U/L   Total Bilirubin 0.7 0.3 - 1.2 mg/dL   GFR, Estimated >09 >60 mL/min    Comment: (NOTE) Calculated using the CKD-EPI Creatinine Equation (2021)    Anion gap 12 5 - 15    Comment: Performed at [facility], [address], [place], [place] [address]  Ethanol     Status: Abnormal   Collection Time: 04/03/23  3:21 AM  Result Value Ref  Range   Alcohol, Ethyl (B) 307 (HH) <10 mg/dL    Comment: CRITICAL RESULT CALLED TO, READ BACK BY AND VERIFIED WITH ASTIN REEVES@0422  04/03/23 RH (NOTE) Lowest detectable limit for serum alcohol is 10 mg/dL.  For medical purposes only. Performed at [facility], [address] [place], [place] [address]   cbc     Status: Abnormal   Collection Time: 04/03/23  3:21 AM  Result Value Ref Range   WBC 4.3 4.0 - 10.5 K/uL   RBC 5.14 (H) 3.87 - 5.11 MIL/uL   Hemoglobin 15.1 (H) 12.0 - 15.0 g/dL   HCT 19.1 47.8 - 29.5 %   MCV 87.7 80.0 - 100.0 fL   MCH 29.4 26.0 - 34.0 pg   MCHC 33.5 30.0 - 36.0 g/dL   RDW 62.1 30.8 - 65.7 %   Platelets 282 150 - 400 K/uL   nRBC 0.0 0.0 - 0.2 %    Comment: Performed at [facility], [address] [place], [place] [address]  Salicylate level     Status: Abnormal   Collection Time: 04/03/23  3:21 AM  Result Value Ref Range   Salicylate Lvl <7.0 (L) 7.0 - 30.0 mg/dL    Comment: Performed at [facility], [address] [place], [place] [address]  Acetaminophen level     Status: Abnormal   Collection Time: 04/03/23  3:21 AM  Result Value Ref Range   Acetaminophen (Tylenol), Serum <10 (L) 10 - 30 ug/mL    Comment: (NOTE) Therapeutic concentrations vary significantly. A range of 10-30 ug/mL  may be an effective concentration for many patients. However, some  are best treated at  concentrations outside of this range. Acetaminophen concentrations >150 ug/mL at 4 hours after ingestion  and >50 ug/mL at 12 hours after ingestion are often associated with  toxic reactions.  Performed at [facility], [address] [place], [place] [address]     Current Facility-Administered Medications  Medication Dose Route Frequency Provider Last Rate Last Admin   folic acid (FOLVITE) tablet 1 mg  1 mg Oral Daily Temesha Queener H, NP   1 mg at 04/03/23 1224   LORazepam (ATIVAN) injection 0-4 mg  0-4 mg Intravenous Q6H Samauri Kellenberger H, NP   2 mg at 04/03/23 1620   Followed by   Melene Muller ON 04/05/2023] LORazepam (ATIVAN) injection 0-4 mg  0-4 mg Intravenous Q12H Jawann Urbani H, NP       LORazepam (ATIVAN) tablet 1-4 mg  1-4 mg Oral Q1H PRN Kolbee Stallman H, NP       Or   LORazepam (ATIVAN) injection 1-4 mg  1-4 mg Intravenous Q1H PRN Vedika Dumlao H, NP       multivitamin with minerals tablet 1 tablet  1 tablet Oral Daily Jamell Laymon H, NP       thiamine (VITAMIN B1) tablet 100 mg  100 mg Oral Daily Anaaya Fuster H, NP       Or   thiamine (VITAMIN B1) injection 100 mg  100 mg Intravenous Daily Tynisa Vohs H, NP       Current Outpatient Medications  Medication Sig Dispense Refill   acyclovir (ZOVIRAX) 400 MG tablet TAKE 1 TABLET BY MOUTH TWICE A DAY 180 tablet 1   traZODone (DESYREL) 50 MG tablet TAKE 1-2 TABLETS EVERY NIGHT 180 tablet 1   venlafaxine XR (EFFEXOR-XR) 75 MG 24 hr capsule TAKE 1 CAPSULE BY MOUTH DAILY WITH BREAKFAST. 90 capsule 1    Musculoskeletal:  Strength & Muscle Tone: within normal limits Gait & Station:  Did not assess  Patient leans: N/A            Psychiatric Specialty Exam:  Presentation  General Appearance: No data recorded Eye Contact:No data recorded Speech:No data recorded Speech Volume:No data recorded Handedness:No data recorded  Mood and Affect  Mood:No data recorded Affect:No data  recorded  Thought Process  Thought Processes:No data recorded Descriptions of Associations:No data recorded Orientation:No data recorded Thought Content:No data recorded History of Schizophrenia/Schizoaffective disorder:No  Duration of Psychotic Symptoms:No data recorded Hallucinations:No data recorded Ideas of Reference:No data recorded Suicidal Thoughts:No data recorded Homicidal Thoughts:No data recorded  Sensorium  Memory:No data recorded Judgment:No data recorded Insight:No data recorded  Executive Functions  Concentration:No data recorded Attention Span:No data recorded Recall:No data recorded Fund of Knowledge:No data recorded Language:No data recorded  Psychomotor Activity  Psychomotor Activity:No data recorded  Assets  Assets:No data recorded  Sleep  Sleep:No data recorded  Physical Exam: Physical Exam Vitals and nursing note reviewed.  Constitutional:      Appearance: She is ill-appearing.  HENT:     Head: Normocephalic.     Nose: Nose normal.  Cardiovascular:     Rate and Rhythm: Tachycardia present.  Pulmonary:     Effort: Pulmonary effort is normal.  Musculoskeletal:        General: Normal range of motion.     Cervical back: Normal range of motion.  Neurological:     Mental Status: She is alert and oriented to person, place, and time.  Psychiatric:        Attention and Perception: Attention normal. She does not perceive auditory or visual hallucinations.        Mood and Affect: Mood is anxious and depressed. Affect is tearful.        Speech: Speech normal.        Behavior: Behavior is cooperative.        Thought Content: Thought content is not paranoid or delusional. Thought content includes suicidal (Patient endorses passive SI with no plan and/or intent) ideation. Thought content does not include homicidal ideation.        Cognition and Memory: Cognition and memory normal.        Judgment: Judgment normal.    ROS Blood pressure (!)  172/101, pulse (!) 108, temperature 98.3 F (36.8 C), resp. rate (!) 22, height 5\' 4"  (1.626 m), SpO2 98 %. Body mass index is 23.17 kg/m.  Treatment Plan Summary: Medication management and Plan : Inpatient psychiatric hospitalization recommended for safety and stabilization.  Initiate CIWA protocol to manage withdrawal symptoms.Will add trazodone 50 mg at bedtime for sleep support.   Plan reviewed with Dr. Vicente Males, EDP.  Disposition: Recommend psychiatric Inpatient admission when medically cleared.  Norma Fredrickson, NP 04/03/2023 5:02 PM

## 2023-04-03 NOTE — ED Notes (Signed)
Pt requesting gingerale

## 2023-04-03 NOTE — ED Provider Notes (Signed)
[facility] Provider Note    Event Date/Time,  First MD Initiated Contact with Patient 04/03/23 [phone]     (approximate)   History   Alcohol Intoxication  Level 5 caveat:  history/ROS limited by acute intoxication  HPI Tracy Phillips is a 57 y.o. female with a history of alcohol abuse although she says she has been clean for about 13 years.  She presents tonight for help with alcohol intoxication and resulting depression and suicidal ideation.  The patient states that she has been drinking for a few days, as much as a box of wine per day.  She is crying and upset and says that she has had thoughts of hurting herself but without a specific plan.  Her mother is with her and nodded and confirmed these findings and was read to me that the patient is having a lot of depression.  Her father passed away few months ago which likely led to the issue with worsening depression and eventually starting to drink again.  Denies other drugs.  No recent injuries.  No known history of complicated alcohol withdrawal.     Physical Exam   Triage Vital Signs: ED Triage Vitals  Enc Vitals Group     BP 04/03/23 0316 (!) 153/99     Pulse Rate 04/03/23 0316 (!) 110     Resp 04/03/23 0316 (!) 24     Temp 04/03/23 0316 98.5 F (36.9 C)     Temp Source 04/03/23 0316 Oral     SpO2 04/03/23 0316 98 %     Weight --      Height 04/03/23 0317 1.626 m (5\' 4" )     Head Circumference --      Peak Flow --      Pain Score 04/03/23 0317 0     Pain Loc --      Pain Edu? --      Excl. in GC? --     Most recent vital signs: Vitals:   04/03/23 0316  BP: (!) 153/99  Pulse: (!) 110  Resp: (!) 24  Temp: 98.5 F (36.9 C)  SpO2: 98%    General: Awake, obviously intoxicated, redirectable but agitated. CV:  Good peripheral perfusion.  Tachycardia, regular rhythm. Resp:  Normal effort. Speaking easily and comfortably, no accessory muscle usage nor intercostal retractions.  Tachypnea  due to being upset but not having any difficulty breathing or speaking. Abd:  No distention.  Other:  Agitated but redirectable, endorsing some vague suicidal thoughts, admits to depression, no recent self-harm.  Acknowledging that she has a problem with alcohol.   ED Results / Procedures / Treatments   Labs (all labs ordered are listed, but only abnormal results are displayed) Labs Reviewed  COMPREHENSIVE METABOLIC PANEL - Abnormal; Notable for the following components:      Result Value   Glucose, Bld 140 (*)    Calcium 8.0 (*)    All other components within normal limits  ETHANOL - Abnormal; Notable for the following components:   Alcohol, Ethyl (B) 307 (*)    All other components within normal limits  CBC - Abnormal; Notable for the following components:   RBC 5.14 (*)    Hemoglobin 15.1 (*)    All other components within normal limits  SALICYLATE LEVEL - Abnormal; Notable for the following components:   Salicylate Lvl <7.0 (*)    All other components within normal limits  ACETAMINOPHEN LEVEL - Abnormal; Notable for the following components:  Acetaminophen (Tylenol), Serum <10 (*)    All other components within normal limits  URINE DRUG SCREEN, QUALITATIVE ([facility] ONLY)     PROCEDURES:  Critical Care performed: No  Procedures    IMPRESSION / MDM / ASSESSMENT AND PLAN / ED COURSE  I reviewed the triage vital signs and the nursing notes.                              Differential diagnosis includes, but is not limited to, substance abuse including alcohol, electrolyte or metabolic abnormality, acute infectious process.  Patient's presentation is most consistent with acute presentation with potential threat to life or bodily function.  Labs/studies ordered: As per protocol, I ordered the following labs as part of the patient's medical and psychiatric evaluation:  CBC, CMP, ethanol level, acetaminophen level, salicylate level, urine drug  screen.  Interventions/Medications given:  Medications  droperidol (INAPSINE) 2.5 MG/ML injection 2.5 mg (2.5 mg Intravenous Given 04/03/23 0421)    (Note:  hospital course my include additional interventions and/or labs/studies not listed above.)   Patient is experiencing depression and some suicidal thoughts in the setting of alcohol abuse.  She may benefit from psychiatry consultation or intervention but at this point she is to be more sober before an accurate assessment can be made.  Given her agitation and need to frequently redirect her, I ordered droperidol 2.5 mg IV for her safety.  Subsequently, the patient has been calm and cooperative.  I do not feel she meets IVC criteria.  I do believe she would benefit from psychiatric consultation once more sober.  She is here voluntarily and wants help.  Mother and patient understand that, because [place] does not place patients in addiction treatment facilities, if she is determined to be appropriate for discharge, they will be provided with resources that they can pursue on their own.   Clinical Course as of 04/03/23 0815  Thu Apr 03, 2023  0801 Transferring ED care to Dr. Vicente Males [CF]  [phone] The patient has been placed in psychiatric observation due to the need to provide a safe environment for the patient while obtaining psychiatric consultation and evaluation, as well as ongoing medical and medication management to treat the patient's condition.  The patient has not been placed under full IVC at this time.   [CF]    Clinical Course User Index [CF] Loleta Rose, MD     FINAL CLINICAL IMPRESSION(S) / ED DIAGNOSES   Final diagnoses:  Alcoholic intoxication with complication (HCC)  Alcohol dependence with intoxication with complication [facility])     Rx / DC Orders   ED Discharge Orders     None        Note:  This document was prepared using Dragon voice recognition software and may include unintentional dictation errors.    Loleta Rose, MD 04/03/23 [phone]

## 2023-04-03 NOTE — BH Assessment (Signed)
Comprehensive Clinical Assessment (CCA) Note  04/03/2023 Tracy Phillips [id number]  Chief Complaint:  Chief Complaint  Patient presents with   Alcohol Intoxication   Visit Diagnosis: Major Depression   Tracy Phillips is a 57 year old female who presents to the ER due to having thoughts of ending her life. She states she have experienced increase depression since the death of her father. He passed approximately two months ago. Patient lives alone and have minimum support system. She has self-medicated with alcohol. Upon arrival to the ER, her BAC was 307. During the interview, the patient was calm, cooperative and pleasant. She was able to provide appropriate answers to the questions. Throughout the interview, the patient denied HI and AV/H.   CCA Screening, Triage and Referral (STR)  Patient Reported Information How did you hear about [place]? No data recorded What Is the Reason for Your Visit/Call Today? Patient voicing SI and increase alcohol use since her father's passing.  How Long Has This Been Causing You Problems? 1-6 months  What Do You Feel Would Help You the Most Today? Treatment for Depression or other mood problem; Alcohol or Drug Use Treatment   Have You Recently Had Any Thoughts About Hurting Yourself? Yes  Are You Planning to Commit Suicide/Harm Yourself At This time? Yes   Flowsheet Row ED from 04/03/2023 in [facility] Emergency Department at [facility]  C-SSRS RISK CATEGORY Moderate Risk       Have you Recently Had Thoughts About Hurting Someone Karolee Ohs? No  Are You Planning to Harm Someone at This Time? No  Explanation: No data recorded  Have You Used Any Alcohol or Drugs in the Past 24 Hours? Yes  What Did You Use and How Much? Alcohol, "box of wine."   Do You Currently Have a Therapist/Psychiatrist? No  Name of Therapist/Psychiatrist:    Have You Been Recently Discharged From Any Office Practice or Programs? No  Explanation of Discharge From  Practice/Program: No data recorded    CCA Screening Triage Referral Assessment Type of Contact: Face-to-Face  Telemedicine Service Delivery:   Is this Initial or Reassessment?   Date Telepsych consult ordered in [facility]:    Time Telepsych consult ordered in [facility]:    Location of Assessment: [facility] ED  Provider Location: [facility] ED   Collateral Involvement: No data recorded  Does Patient Have a Court Appointed Legal Guardian? No  Legal Guardian Contact Information: No data recorded Copy of Legal Guardianship Form: No data recorded Legal Guardian Notified of Arrival: No data recorded Legal Guardian Notified of Pending Discharge: No data recorded If Minor and Not Living with Parent(s), Who has Custody? No data recorded Is CPS involved or ever been involved? Never  Is APS involved or ever been involved? Never   Patient Determined To Be At Risk for Harm To Self or Others Based on Review of Patient Reported Information or Presenting Complaint? Yes, for Self-Harm  Method: No data recorded Availability of Means: No data recorded Intent: No data recorded Notification Required: No data recorded Additional Information for Danger to Others Potential: No data recorded Additional Comments for Danger to Others Potential: No data recorded Are There Guns or Other Weapons in Your Home? No data recorded Types of Guns/Weapons: No data recorded Are These Weapons Safely Secured?                            No data recorded Who Could Verify You Are Able To Have These  Secured: No data recorded Do You Have any Outstanding Charges, Pending Court Dates, Parole/Probation? No data recorded Contacted To Inform of Risk of Harm To Self or Others: No data recorded   Does Patient Present under Involuntary Commitment? No  [place] of Residence: [place]  Patient Currently Receiving the Following Services: Medication Management  Determination of Need: Emergent (2 hours)  Options For Referral: ED Visit; Inpatient  Hospitalization   CCA Biopsychosocial Patient Reported Schizophrenia/Schizoaffective Diagnosis in Past: No   Strengths: Some insight, wanting to get help and have stable housing.   Mental Health Symptoms Depression:   Hopelessness; Difficulty Concentrating; Sleep (too much or little)   Duration of Depressive symptoms:  Duration of Depressive Symptoms: Greater than two weeks   Mania:   None   Anxiety:    Tension; Worrying; Restlessness; Sleep   Psychosis:   None   Duration of Psychotic symptoms:    Trauma:   N/A   Obsessions:   N/A   Compulsions:   N/A   Inattention:   N/A   Hyperactivity/Impulsivity:   N/A   Oppositional/Defiant Behaviors:   N/A   Emotional Irregularity:   N/A   Other Mood/Personality Symptoms:  No data recorded   Mental Status Exam Appearance and self-care  Stature:   Average   Weight:   Average weight   Clothing:   Neat/clean   Grooming:   Normal   Cosmetic use:   None   Posture/gait:   Normal   Motor activity:   -- (UTA, patient laying in the bed.)   Sensorium  Attention:   Normal   Concentration:   Normal   Orientation:   X5   Recall/memory:   Normal   Affect and Mood  Affect:   Appropriate; Depressed; Full Range   Mood:   Depressed; Anxious   Relating  Eye contact:   Normal   Facial expression:   Anxious; Depressed; Responsive   Attitude toward examiner:   Cooperative   Thought and Language  Speech flow:  Clear and Coherent; Normal   Thought content:   Appropriate to Mood and Circumstances   Preoccupation:   None   Hallucinations:   None   Organization:   Coherent; Intact   [employer] of Knowledge:   Average   Intelligence:   Average   Abstraction:   Functional; Normal   Judgement:   Impaired; Dangerous   Reality Testing:  No data recorded  Insight:   Fair; Poor   Decision Making:   Normal   Social Functioning  Social Maturity:   Isolates    Social Judgement:   Normal   Stress  Stressors:   Relationship   Coping Ability:   Normal   Skill Deficits:   None   Supports:   Family     Religion: Religion/Spirituality Are You A Religious Person?: No  Leisure/Recreation: Leisure / Recreation Do You Have Hobbies?: No  Exercise/Diet: Exercise/Diet Do You Exercise?: No Have You Gained or Lost A Significant Amount of Weight in the Past Six Months?: No Do You Follow a Special Diet?: No Do You Have Any Trouble Sleeping?: Yes Explanation of Sleeping Difficulties: Trouble falling and staying asleep.   CCA Employment/Education Employment/Work Situation: Employment / Work Clinical biochemist has Been Impacted by Current Illness: No Has Patient ever Been in the [employer]?: No  Education: Education Is Patient Currently Attending School?: No Did You Product manager?: No Did You Have An Individualized Education Program (IIEP): No Did You Have Any  Difficulty At School?: No Patient's Education Has Been Impacted by Current Illness: No   CCA Family/Childhood History Family and Relationship History: Family history Marital status: Single Does patient have children?: No  Childhood History:  Childhood History By whom was/is the patient raised?: Both parents Did patient suffer any verbal/emotional/physical/sexual abuse as a child?: No Did patient suffer from severe childhood neglect?: No Has patient ever been sexually abused/assaulted/raped as an adolescent or adult?: No Was the patient ever a victim of a crime or a disaster?: No Witnessed domestic violence?: No Has patient been affected by domestic violence as an adult?: No   CCA Substance Use Alcohol/Drug Use: Alcohol / Drug Use Pain Medications: See MAR Prescriptions: See MAR Over the Counter: See MAR History of alcohol / drug use?: Yes Longest period of sobriety (when/how long): Unable to quantify Substance #1 Name of Substance 1: Alcohol 1 - Amount  (size/oz): Box of wine within three days. 1 - Frequency: Unable to quantify 1 - Last Use / Amount: 04/04/2023 1- Route of Use: Oral  ASAM's:  Six Dimensions of Multidimensional Assessment  Dimension 1:  Acute Intoxication and/or Withdrawal Potential:      Dimension 2:  Biomedical Conditions and Complications:      Dimension 3:  Emotional, Behavioral, or Cognitive Conditions and Complications:     Dimension 4:  Readiness to Change:     Dimension 5:  Relapse, Continued use, or Continued Problem Potential:     Dimension 6:  Recovery/Living Environment:     ASAM Severity Score:    ASAM Recommended Level of Treatment:     Substance use Disorder (SUD)    Recommendations for Services/Supports/Treatments:    Discharge Disposition:    DSM5 Diagnoses: Patient Active Problem List   Diagnosis Date Noted   Hyperglycemia 05/02/2019   Healthcare maintenance 09/26/2017   Anxiety 09/07/2017   Hot flashes 09/07/2017   Alcohol abuse 11/07/2013   Depression 11/07/2013   Headache(784.0) 11/07/2013   Hypercholesterolemia 11/07/2013   Kidney stones 11/07/2013   Personal history of colonic polyps 11/07/2013   Herpes simplex 11/07/2013     Referrals to Alternative Service(s): Referred to Alternative Service(s):   Place:   Date:   Time:    Referred to Alternative Service(s):   Place:   Date:   Time:    Referred to Alternative Service(s):   Place:   Date:   Time:    Referred to Alternative Service(s):   Place:   Date:   Time:     Lilyan Gilford MS, LCAS, [facility], [facility] Therapeutic Triage Specialist 04/03/2023 12:27 PM

## 2023-04-03 NOTE — ED Notes (Signed)
RN called dietary for safety finger food tray.

## 2023-04-03 NOTE — ED Notes (Signed)
Pt resting comfortably with eyes closed at this time.

## 2023-04-03 NOTE — ED Notes (Signed)
Pt dressed out into hospital scrubs by this RN and paige NT.

## 2023-04-03 NOTE — BH Assessment (Signed)
Per the request of the patient, TTS spoke with the patient's mother Pam Drown [phone]) and gave her update with disposition.  Mother reports she was sober thirteen years. She was depressed before her father passed but increased more since then. She's also paranoid "everything." Combination of people or things trying to get her, harm her or spying on her. Spoke with the son as well and he shared, it would be good to get a urine specimen from the patient. "That will be helpful, if you know what I mean."

## 2023-04-03 NOTE — ED Notes (Signed)
RN reached out to [employer] psych NP for PRN orders for pt elevated CIWA. Currently only ordered for Q6H ativan.

## 2023-04-03 NOTE — ED Notes (Signed)
Pt had verbally consented that mother will take patients belongings home once in a room

## 2023-04-03 NOTE — ED Notes (Signed)
Staff assisted pt to restroom. Pt ambulated with slow steady gait. Pt received tray. Pt denies any additional needs at this time.

## 2023-04-03 NOTE — ED Notes (Signed)
Pt in and out of sleep at this time. Tremors noted but have decreased significantly since medication administration.

## 2023-04-03 NOTE — ED Notes (Signed)
RN called psych NP for PRN orders.

## 2023-04-03 NOTE — ED Notes (Signed)
Patient is sleeping soundly at this time.  Respirations are even and unlabored.  Patient was not awoken to perform 1 hour CIWA.

## 2023-04-03 NOTE — ED Notes (Signed)
Assumed care from Rachel,RN. Pt resting comfortably in bed at this time.

## 2023-04-03 NOTE — ED Triage Notes (Addendum)
Pt to ED via POV c/o alcohol intoxication. Pt has been drinking for a few days. Today drank a "box of wine". Pt stating she would also like detox. Pt crying in triage, endorses thoughts of hurting self with no plan. Denies CP, SOB, fevers. Answering questions appropriately, A&oX4

## 2023-04-04 DIAGNOSIS — F10229 Alcohol dependence with intoxication, unspecified: Secondary | ICD-10-CM | POA: Diagnosis present

## 2023-04-04 DIAGNOSIS — Z811 Family history of alcohol abuse and dependence: Secondary | ICD-10-CM | POA: Diagnosis not present

## 2023-04-04 DIAGNOSIS — R45851 Suicidal ideations: Secondary | ICD-10-CM | POA: Diagnosis not present

## 2023-04-04 DIAGNOSIS — Z823 Family history of stroke: Secondary | ICD-10-CM | POA: Diagnosis not present

## 2023-04-04 DIAGNOSIS — Z634 Disappearance and death of family member: Secondary | ICD-10-CM | POA: Diagnosis not present

## 2023-04-04 DIAGNOSIS — Z8261 Family history of arthritis: Secondary | ICD-10-CM | POA: Diagnosis not present

## 2023-04-04 DIAGNOSIS — Y908 Blood alcohol level of 240 mg/100 ml or more: Secondary | ICD-10-CM | POA: Diagnosis present

## 2023-04-04 DIAGNOSIS — E785 Hyperlipidemia, unspecified: Secondary | ICD-10-CM | POA: Diagnosis present

## 2023-04-04 DIAGNOSIS — Z886 Allergy status to analgesic agent status: Secondary | ICD-10-CM | POA: Diagnosis not present

## 2023-04-04 DIAGNOSIS — Z803 Family history of malignant neoplasm of breast: Secondary | ICD-10-CM | POA: Diagnosis not present

## 2023-04-04 DIAGNOSIS — F10931 Alcohol use, unspecified with withdrawal delirium: Secondary | ICD-10-CM | POA: Diagnosis not present

## 2023-04-04 DIAGNOSIS — Z83438 Family history of other disorder of lipoprotein metabolism and other lipidemia: Secondary | ICD-10-CM | POA: Diagnosis not present

## 2023-04-04 DIAGNOSIS — F419 Anxiety disorder, unspecified: Secondary | ICD-10-CM

## 2023-04-04 DIAGNOSIS — F10231 Alcohol dependence with withdrawal delirium: Secondary | ICD-10-CM | POA: Diagnosis present

## 2023-04-04 DIAGNOSIS — Z8249 Family history of ischemic heart disease and other diseases of the circulatory system: Secondary | ICD-10-CM | POA: Diagnosis not present

## 2023-04-04 DIAGNOSIS — F332 Major depressive disorder, recurrent severe without psychotic features: Secondary | ICD-10-CM | POA: Diagnosis not present

## 2023-04-04 DIAGNOSIS — Z79899 Other long term (current) drug therapy: Secondary | ICD-10-CM | POA: Diagnosis not present

## 2023-04-04 DIAGNOSIS — I10 Essential (primary) hypertension: Secondary | ICD-10-CM | POA: Diagnosis present

## 2023-04-04 DIAGNOSIS — F10939 Alcohol use, unspecified with withdrawal, unspecified: Secondary | ICD-10-CM | POA: Diagnosis not present

## 2023-04-04 DIAGNOSIS — Z801 Family history of malignant neoplasm of trachea, bronchus and lung: Secondary | ICD-10-CM | POA: Diagnosis not present

## 2023-04-04 DIAGNOSIS — Z833 Family history of diabetes mellitus: Secondary | ICD-10-CM | POA: Diagnosis not present

## 2023-04-04 LAB — COMPREHENSIVE METABOLIC PANEL
ALT: 27 U/L (ref 0–44)
AST: 48 U/L — ABNORMAL HIGH (ref 15–41)
Albumin: 3.9 g/dL (ref 3.5–5.0)
Alkaline Phosphatase: 58 U/L (ref 38–126)
Anion gap: 10 (ref 5–15)
BUN: 21 mg/dL — ABNORMAL HIGH (ref 6–20)
CO2: 26 mmol/L (ref 22–32)
Calcium: 8.5 mg/dL — ABNORMAL LOW (ref 8.9–10.3)
Chloride: 99 mmol/L (ref 98–111)
Creatinine, Ser: 0.83 mg/dL (ref 0.44–1.00)
GFR, Estimated: >60 mL/min (ref >60)
Glucose, Bld: 122 mg/dL — ABNORMAL HIGH (ref 70–99)
Potassium: 3.5 mmol/L (ref 3.5–5.1)
Sodium: 135 mmol/L (ref 135–145)
Total Bilirubin: 1.4 mg/dL — ABNORMAL HIGH (ref 0.3–1.2)
Total Protein: 6.8 g/dL (ref 6.5–8.1)

## 2023-04-04 LAB — CBC
HCT: 42 % (ref 36.0–46.0)
Hemoglobin: 13.9 g/dL (ref 12.0–15.0)
MCH: 29.4 pg (ref 26.0–34.0)
MCHC: 33.1 g/dL (ref 30.0–36.0)
MCV: 88.8 fL (ref 80.0–100.0)
Platelets: 189 10*3/uL (ref 150–400)
RBC: 4.73 MIL/uL (ref 3.87–5.11)
RDW: 13.7 % (ref 11.5–15.5)
WBC: 6.4 10*3/uL (ref 4.0–10.5)
nRBC: 0 % (ref 0.0–0.2)

## 2023-04-04 LAB — URINE DRUG SCREEN, QUALITATIVE (ARMC ONLY)
Amphetamines, Ur Screen: NOT DETECTED
Barbiturates, Ur Screen: NOT DETECTED
Benzodiazepine, Ur Scrn: POSITIVE — AB
Cannabinoid 50 Ng, Ur ~~LOC~~: NOT DETECTED
Cocaine Metabolite,Ur ~~LOC~~: POSITIVE — AB
MDMA (Ecstasy)Ur Screen: NOT DETECTED
Methadone Scn, Ur: NOT DETECTED
Opiate, Ur Screen: NOT DETECTED
Phencyclidine (PCP) Ur S: NOT DETECTED
Tricyclic, Ur Screen: NOT DETECTED

## 2023-04-04 LAB — HIV ANTIBODY (ROUTINE TESTING W REFLEX): HIV Screen 4th Generation wRfx: NONREACTIVE

## 2023-04-04 MED ORDER — ONDANSETRON HCL 4 MG/2ML IJ SOLN
4.0000 mg | Freq: Once | INTRAMUSCULAR | Status: AC
  Administered 2023-04-04: 4 mg via INTRAVENOUS
  Filled 2023-04-04: qty 2

## 2023-04-04 MED ORDER — HYDROXYZINE HCL 25 MG PO TABS
25.0000 mg | ORAL_TABLET | Freq: Four times a day (QID) | ORAL | Status: DC | PRN
  Administered 2023-04-04 – 2023-04-06 (×2): 25 mg via ORAL
  Filled 2023-04-04 (×3): qty 1

## 2023-04-04 MED ORDER — ONDANSETRON HCL 4 MG/2ML IJ SOLN
4.0000 mg | Freq: Four times a day (QID) | INTRAMUSCULAR | Status: DC | PRN
  Administered 2023-04-04 – 2023-04-05 (×5): 4 mg via INTRAVENOUS
  Filled 2023-04-04 (×5): qty 2

## 2023-04-04 MED ORDER — CHLORDIAZEPOXIDE HCL 25 MG PO CAPS
50.0000 mg | ORAL_CAPSULE | Freq: Once | ORAL | Status: AC
  Administered 2023-04-04: 50 mg via ORAL
  Filled 2023-04-04: qty 2

## 2023-04-04 MED ORDER — VENLAFAXINE HCL ER 75 MG PO CP24
75.0000 mg | ORAL_CAPSULE | Freq: Every day | ORAL | Status: DC
  Administered 2023-04-04 – 2023-04-06 (×3): 75 mg via ORAL
  Filled 2023-04-04 (×3): qty 1

## 2023-04-04 MED ORDER — ONDANSETRON 4 MG PO TBDP
4.0000 mg | ORAL_TABLET | ORAL | Status: AC
  Administered 2023-04-04: 4 mg via ORAL
  Filled 2023-04-04: qty 1

## 2023-04-04 MED ORDER — SODIUM CHLORIDE 0.9 % IV SOLN: INTRAVENOUS | Status: DC

## 2023-04-04 MED ORDER — TRAZODONE HCL 50 MG PO TABS: 25.0000 mg | ORAL_TABLET | Freq: Every evening | ORAL | Status: DC | PRN

## 2023-04-04 MED ORDER — ACETAMINOPHEN 650 MG RE SUPP: 650.0000 mg | Freq: Four times a day (QID) | RECTAL | Status: DC | PRN

## 2023-04-04 MED ORDER — FOLIC ACID 1 MG PO TABS: 1.0000 mg | ORAL_TABLET | Freq: Every day | ORAL | Status: DC

## 2023-04-04 MED ORDER — CHLORDIAZEPOXIDE HCL 25 MG PO CAPS
25.0000 mg | ORAL_CAPSULE | Freq: Four times a day (QID) | ORAL | Status: DC | PRN
  Administered 2023-04-05: 25 mg via ORAL
  Filled 2023-04-04: qty 1

## 2023-04-04 MED ORDER — ACETAMINOPHEN 325 MG PO TABS
650.0000 mg | ORAL_TABLET | Freq: Four times a day (QID) | ORAL | Status: DC | PRN
  Administered 2023-04-04 – 2023-04-06 (×6): 650 mg via ORAL
  Filled 2023-04-04 (×6): qty 2

## 2023-04-04 MED ORDER — ENOXAPARIN SODIUM 40 MG/0.4ML IJ SOSY
40.0000 mg | PREFILLED_SYRINGE | INTRAMUSCULAR | Status: DC
  Administered 2023-04-04 – 2023-04-05 (×2): 40 mg via SUBCUTANEOUS
  Filled 2023-04-04 (×2): qty 0.4

## 2023-04-04 MED ORDER — BUTALBITAL-APAP-CAFFEINE 50-325-40 MG PO TABS
2.0000 | ORAL_TABLET | Freq: Once | ORAL | Status: AC
  Administered 2023-04-04: 2 via ORAL
  Filled 2023-04-04: qty 2

## 2023-04-04 MED ORDER — ONDANSETRON HCL 4 MG PO TABS
4.0000 mg | ORAL_TABLET | Freq: Four times a day (QID) | ORAL | Status: DC | PRN
  Administered 2023-04-04 – 2023-04-06 (×4): 4 mg via ORAL
  Filled 2023-04-04 (×4): qty 1

## 2023-04-04 MED ORDER — POTASSIUM CHLORIDE CRYS ER 20 MEQ PO TBCR: 40.0000 meq | EXTENDED_RELEASE_TABLET | Freq: Once | ORAL | Status: DC

## 2023-04-04 MED ORDER — ONDANSETRON 4 MG PO TBDP: 4.0000 mg | ORAL_TABLET | Freq: Four times a day (QID) | ORAL | Status: DC | PRN

## 2023-04-04 MED ORDER — MAGNESIUM HYDROXIDE 400 MG/5ML PO SUSP: 30.0000 mL | Freq: Every day | ORAL | Status: DC | PRN

## 2023-04-04 MED ORDER — THIAMINE MONONITRATE 100 MG PO TABS: 100.0000 mg | ORAL_TABLET | Freq: Every day | ORAL | Status: DC

## 2023-04-04 MED ORDER — PROCHLORPERAZINE EDISYLATE 10 MG/2ML IJ SOLN
10.0000 mg | Freq: Once | INTRAMUSCULAR | Status: AC
  Administered 2023-04-04: 10 mg via INTRAVENOUS
  Filled 2023-04-04: qty 2

## 2023-04-04 MED ORDER — LOPERAMIDE HCL 2 MG PO CAPS: 2.0000 mg | ORAL_CAPSULE | ORAL | Status: DC | PRN

## 2023-04-04 MED ORDER — ADULT MULTIVITAMIN W/MINERALS CH: 1.0000 | ORAL_TABLET | Freq: Every day | ORAL | Status: DC

## 2023-04-04 MED ORDER — POTASSIUM CHLORIDE IN NACL 20-0.9 MEQ/L-% IV SOLN
INTRAVENOUS | Status: DC
  Filled 2023-04-04 (×6): qty 1000

## 2023-04-04 MED ORDER — ACETAMINOPHEN 325 MG PO TABS
650.0000 mg | ORAL_TABLET | Freq: Once | ORAL | Status: AC
  Administered 2023-04-04: 650 mg via ORAL
  Filled 2023-04-04: qty 2

## 2023-04-04 NOTE — ED Notes (Signed)
Pt given sprite and ginger ale

## 2023-04-04 NOTE — H&P (Addendum)
[place]   PATIENT NAME: Tracy Phillips    MR#:  [id number]  DATE OF BIRTH:  02/13/1966  DATE OF ADMISSION:  04/03/2023  PRIMARY CARE PHYSICIAN: Dale [place], MD   Patient is coming from: Home  REQUESTING/REFERRING PHYSICIAN: Willy Eddy, MD  CHIEF COMPLAINT:   Chief Complaint  Patient presents with   Alcohol Intoxication    HISTORY OF PRESENT ILLNESS:  Tracy Phillips is a 57 y.o. female with medical history significant for alcohol abuse, hypertension, dyslipidemia, depression and colonic polyp, who presented to the emergency room with acute onset of alcohol intoxication though the patient stated that she has been sober for about 13 years.  She had subsequent depression and suicidal ideation with alcohol intoxication.  She came to the ER requesting help and has been boarding since yesterday morning.  She stated that she has been drinking as much is a box of wine per day and was crying and remorseful.  She admitted to having suicidal thoughts.  Her father died a few months ago which contributed to her depression deterioration.  The patient later on became tremulous and agitated with tachycardia and manifested signs of alcohol withdrawal.  She was placed on CIWA protocol and her CIWA scores been up to 14-19.  She had no reported chest pain or palpitations.  No cough or wheezing or dyspnea.  No nausea or vomiting or abdominal pain.  No dysuria, oliguria or hematuria or flank pain.  During the interview the patient was fairly somnolent and therefore an overall poor historian.  ED Course: When she came to the ER BP was 153/99 heart rate 110 respiratory 24 and later BP was 156/74 with heart rate of 115 and respiratory to 20.  Labs revealed unremarkable CMP.  CBC showed mild hemoconcentration.  Tylenol level was less than 10 and salicylate less than 7.  Alcohol level was 307.   EKG as reviewed by me : Sinus tachycardia with a rate of 120 with right atrial enlargement  The  patient was placed on CIWA protocol.  She was given 50 mg of p.o. Librium, 650 mg p.o. Tylenol, 2.5 mg of IV Inapsine, 4 mg of IV Zofran and 4 mg p.o. Zofran.  She will be admitted to a progressive unit bed for further evaluation and management. PAST MEDICAL HISTORY:   Past Medical History:  Diagnosis Date   Depression    H/O dizziness    H/O eating disorder    H/O: alcohol abuse    Herpes    H/O   History of chicken pox    History of colon polyps    Hyperlipidemia    Hypertension    Kidney stones    H/O stones    PAST SURGICAL HISTORY:   Past Surgical History:  Procedure Laterality Date   ABDOMINAL HYSTERECTOMY  1998   APPENDECTOMY  1997   DILATION AND CURETTAGE OF UTERUS     TONSILLECTOMY  1997    SOCIAL HISTORY:   Social History   Tobacco Use   Smoking status: Never   Smokeless tobacco: Never  Substance Use Topics   Alcohol use: No    Comment: sober since 10/27/2010    FAMILY HISTORY:   Family History  Problem Relation Age of Onset   Hyperlipidemia Mother    Hypertension Mother    Breast cancer Mother 69   Arthritis Father    Hyperlipidemia Father    Hypertension Father    Diabetes Father    Hypertension  Brother    Arthritis Maternal Aunt    Hyperlipidemia Maternal Aunt    Hypertension Maternal Aunt    Alcohol abuse Maternal Uncle    Arthritis Maternal Uncle    Hyperlipidemia Maternal Uncle    Hypertension Maternal Uncle    Arthritis Paternal Aunt    Hyperlipidemia Paternal Aunt    Hypertension Paternal Aunt    Arthritis Paternal Uncle    Hyperlipidemia Paternal Uncle    Hypertension Paternal Uncle    Arthritis Maternal Grandmother    Hyperlipidemia Maternal Grandmother    Hypertension Maternal Grandmother    Arthritis Maternal Grandfather    Lung cancer Maternal Grandfather    Prostate cancer Maternal Grandfather    Hyperlipidemia Maternal Grandfather    Stroke Maternal Grandfather    Hypertension Maternal Grandfather    Arthritis  Paternal Grandmother    Hyperlipidemia Paternal Grandmother    Hypertension Paternal Grandmother    Alcohol abuse Paternal Grandfather    Arthritis Paternal Grandfather    Hyperlipidemia Paternal Grandfather    Hypertension Paternal Grandfather    Sudden death Paternal Grandfather     DRUG ALLERGIES:   Allergies  Allergen Reactions   Aleve [Naproxen Sodium] Hives    REVIEW OF SYSTEMS:   ROS As per history of present illness. All pertinent systems were reviewed above. Constitutional, HEENT, cardiovascular, respiratory, GI, GU, musculoskeletal, neuro, psychiatric, endocrine, integumentary and hematologic systems were reviewed and are otherwise negative/unremarkable except for positive findings mentioned above in the HPI.   MEDICATIONS AT HOME:   Prior to Admission medications   Medication Sig Start Date End Date Taking? Authorizing Provider  acyclovir (ZOVIRAX) 400 MG tablet TAKE 1 TABLET BY MOUTH TWICE A DAY 07/14/22   Worthy Rancher B, FNP  traZODone (DESYREL) 50 MG tablet TAKE 1-2 TABLETS EVERY NIGHT 05/30/22   Dale Marengo, MD  venlafaxine XR (EFFEXOR-XR) 75 MG 24 hr capsule TAKE 1 CAPSULE BY MOUTH DAILY WITH BREAKFAST. 05/30/22   Dale Arbutus, MD      VITAL SIGNS:  Blood pressure (!) 156/74, pulse (!) 115, temperature 98.2 F (36.8 C), temperature source Oral, resp. rate 20, height 5\' 4"  (1.626 m), SpO2 95 %.  PHYSICAL EXAMINATION:  Physical Exam  GENERAL:  57 y.o.-year-old Caucasian female patient lying in the bed with no acute distress.  EYES: Pupils equal, round, reactive to light and accommodation. No scleral icterus. Extraocular muscles intact.  HEENT: Head atraumatic, normocephalic. Oropharynx and nasopharynx clear.  NECK:  Supple, no jugular venous distention. No thyroid enlargement, no tenderness.  LUNGS: Normal breath sounds bilaterally, no wheezing, rales,rhonchi or crepitation. No use of accessory muscles of respiration.  CARDIOVASCULAR: Regular rate and  rhythm, S1, S2 normal. No murmurs, rubs, or gallops.  ABDOMEN: Soft, nondistended, nontender. Bowel sounds present. No organomegaly or mass.  EXTREMITIES: No pedal edema, cyanosis, or clubbing.  NEUROLOGIC: Cranial nerves II through XII are intact. Muscle strength 5/5 in all extremities. Sensation intact. Gait not checked.  PSYCHIATRIC: The patient is alert and oriented x 3.  Normal affect and good eye contact. SKIN: No obvious rash, lesion, or ulcer.   LABORATORY PANEL:   CBC Recent Labs  Lab 04/03/23 0321  WBC 4.3  HGB 15.1*  HCT 45.1  PLT 282   ------------------------------------------------------------------------------------------------------------------  Chemistries  Recent Labs  Lab 04/03/23 0321  NA 136  K 3.7  CL 99  CO2 25  GLUCOSE 140*  BUN 11  CREATININE 0.60  CALCIUM 8.0*  AST 32  ALT 23  ALKPHOS 50  BILITOT 0.7   ------------------------------------------------------------------------------------------------------------------  Cardiac Enzymes No results for input(s): "TROPONINI" in the last 168 hours. ------------------------------------------------------------------------------------------------------------------  RADIOLOGY:  No results found.    IMPRESSION AND PLAN:  Assessment and Plan: * Alcohol withdrawal (HCC) - This is associated with delirium tremens. - The patient was admitted to a progressive unit bed. - He will be monitored with CIWA protocol and placed on as needed IV Ativan. - Psychiatry consult was obtained. - With clinical improvement and will be admitted to psychiatry bed for alcohol detoxification. - We will continue Librium while she is here for detox.  Major depressive disorder, recurrent episode, severe (HCC) - We will restart her trazodone and Effexor XR.  Suicidal ideation - Psychiatry consult will be obtained as mentioned above. - Dr. Toni Amend was notified about the patient.    DVT prophylaxis: Lovenox. Advanced  Care Planning:  Code Status: full code. Family Communication:  The plan of care was discussed in details with the patient (and family). I answered all questions. The patient agreed to proceed with the above mentioned plan. Further management will depend upon hospital course. Disposition Plan: Back to previous home environment Consults called: Psychiatry. All the records are reviewed and case discussed with ED provider.  Status is: Inpatient    At the time of the admission, it appears that the appropriate admission status for this patient is inpatient.  This is judged to be reasonable and necessary in order to provide the required intensity of service to ensure the patient's safety given the presenting symptoms, physical exam findings and initial radiographic and laboratory data in the context of comorbid conditions.  The patient requires inpatient status due to high intensity of service, high risk of further deterioration and high frequency of surveillance required.  I certify that at the time of admission, it is my clinical judgment that the patient will require inpatient hospital care extending more than 2 midnights.                            Dispo: The patient is from: Home              Anticipated d/c is to: Home              Patient currently is not medically stable to d/c.              Difficult to place patient: No  Hannah Beat M.D on 04/04/2023 at 3:33 AM  [facility]   From 7 PM-7 AM, contact night-coverage [email or web address]  CC: Primary care physician; Dale [place], MD

## 2023-04-04 NOTE — Progress Notes (Signed)
  Progress Note   Patient: Tracy Phillips:[id number] DOB: 1966/07/19 DOA: 04/03/2023     0 DOS: the patient was seen and examined on 04/04/2023   Brief hospital course: TOMISHA KLEIN is a 57 y.o. female with medical history significant for alcohol abuse, hypertension, dyslipidemia, depression and colonic polyp, who presented to the emergency room with acute onset of alcohol intoxication.  Patient developed evidence of alcohol withdrawal, was a placed on CIWA protocol.  Patient also had a suicide ideation, consult from psychiatry is obtained.   Principal Problem:   Alcohol withdrawal (HCC) Active Problems:   Major depressive disorder, recurrent episode, severe (HCC)   Suicidal ideation   Alcohol abuse   Anxiety   Alcoholic intoxication with complication (HCC)   Alcohol withdrawal delirium (HCC)   Assessment and Plan: * Alcohol withdrawal (HCC) Patient still complaining of some nausea, significant tremor, no confusion today.  Continue Librium and CIWA protocol. I will also continue some fluids as patient p.o. intake is inadequate.  Potassium is 3.5, added potassium into the IV fluids.  Major depressive disorder, recurrent episode, severe (HCC) Suicidal ideation Continue home medicines, psychiatry will see patient today.       Subjective: Patient has some nausea, no vomiting. No abdominal pain.  Physical Exam: Vitals:   04/04/23 0829 04/04/23 1135 04/04/23 1234 04/04/23 1234  BP: (!) 172/98  (!) 179/97 (!) 179/97  Pulse: 98 72 94 94  Resp:  19 18   Temp:      TempSrc:      SpO2:   99%   Weight:      Height:       General exam: Appears calm and comfortable  Respiratory system: Clear to auscultation. Respiratory effort normal. Cardiovascular system: S1 & S2 heard, RRR. No JVD, murmurs, rubs, gallops or clicks. No pedal edema. Gastrointestinal system: Abdomen is nondistended, soft and nontender. No organomegaly or masses felt. Normal bowel sounds heard. Central  nervous system: Alert and oriented x2. No focal neurological deficits. Extremities: Symmetric 5 x 5 power. Skin: No rashes, lesions or ulcers Psychiatry: Anxious.   Data Reviewed:  Lab results reviewed.  Family Communication: None  Disposition: Status is: Inpatient Remains inpatient appropriate because: Severity of disease, IV treatment.     Time spent: no charge minutes  Author: Marrion Coy, MD 04/04/2023 12:50 PM  For on call review [email or web address].

## 2023-04-04 NOTE — ED Notes (Signed)
Pt seen walking around her bed, IV still in her arm infusing, with all monitoring equipment removed from her person. Pt redirected back in bed by Charge RN, then was reconnected to all monitoring devices by this EDT. Pt informed of call bell on wall and its proper use. Both bed rails raised for pt safety. A pillow was also provided for pts comfort in bed.

## 2023-04-04 NOTE — Progress Notes (Signed)
       CROSS COVER NOTE  NAME: Tracy Phillips MRN: [id number] DOB : 07/01/1966     HPI/Events of Note   Report:ongoing nausea and headache despite receipt of acetaminophen and electrolyte/fluid replacement  On review of chart:admitted with alcohol withdrawal    Assessment and  Interventions   Assessment:  Plan: Compazine and fiorcet ordered       Donnie Mesa NP [facility] Cross Cover 7pm-7am - check amion for availability Pager [phone]

## 2023-04-04 NOTE — Hospital Course (Signed)
Tracy Phillips is a 57 y.o. female with medical history significant for alcohol abuse, hypertension, dyslipidemia, depression and colonic polyp, who presented to the emergency room with acute onset of alcohol intoxication.  Patient developed evidence of alcohol withdrawal, was a placed on CIWA protocol.  Patient also had a suicide ideation, consult from psychiatry is obtained.

## 2023-04-04 NOTE — ED Notes (Signed)
Pt given breakfast tray

## 2023-04-04 NOTE — Assessment & Plan Note (Deleted)
-   We will resume his trazodone and Effexor XR pending psychiatry evaluation.

## 2023-04-04 NOTE — Assessment & Plan Note (Signed)
-   We will restart her trazodone and Effexor XR.

## 2023-04-04 NOTE — ED Notes (Signed)
Pharmacy messaged for effexor

## 2023-04-04 NOTE — ED Notes (Signed)
Pt resting, RR even and unlabored. NAD noted

## 2023-04-04 NOTE — ED Notes (Signed)
Dr. Arville Care woke patient up for brief exam

## 2023-04-04 NOTE — ED Notes (Signed)
Pt requesting meds for anxiety and nausea.  Pt does now endorse SI, denies plan.

## 2023-04-04 NOTE — ED Provider Notes (Signed)
Patient noted to be having increasing CIWA scores in the teens requiring multiple doses of benzos.  Patient will require hospitalization for acute alcohol withdrawal.   Willy Eddy, MD 04/04/23 [phone]

## 2023-04-04 NOTE — Assessment & Plan Note (Addendum)
-   This is associated with delirium tremens. - The patient was admitted to a progressive unit bed. - He will be monitored with CIWA protocol and placed on as needed IV Ativan. - Psychiatry consult was obtained. - With clinical improvement and will be admitted to psychiatry bed for alcohol detoxification. - We will continue Librium while she is here for detox.

## 2023-04-04 NOTE — Assessment & Plan Note (Addendum)
-   Psychiatry consult will be obtained as mentioned above. - Dr. Toni Amend was notified about the patient.

## 2023-04-04 NOTE — ED Notes (Signed)
Pt resting comfortably at this time, RR even and unlabored. NAD noted.

## 2023-04-04 NOTE — Consult Note (Signed)
[facility] Face-to-Face Psychiatry Consult   Reason for Consult: Follow-up consult 57 year old woman presented to the emergency room with alcohol withdrawal and depressive symptoms Referring Physician:  Chipper Herb Patient Identification: Tracy Phillips MRN:  [id number] Principal Diagnosis: Alcohol withdrawal (HCC) Diagnosis:  Principal Problem:   Alcohol withdrawal (HCC) Active Problems:   Alcohol abuse   Anxiety   Major depressive disorder, recurrent episode, severe (HCC)   Suicidal ideation   Alcoholic intoxication with complication (HCC)   Alcohol withdrawal delirium (HCC)   Total Time spent with patient: 30 minutes  Subjective:   Tracy Phillips is a 57 y.o. female patient admitted with "I do not feel well".  HPI: Patient seen and chart reviewed.  57 year old woman with a history of alcohol abuse reports that she had been sober for about 13 years and had relapsed into alcohol use using about a box of wine a day for several continuous days.  Drug screen also positive for cocaine and benzodiazepines.  Patient was seen by the psychiatric service yesterday and was complaining of depression and alcohol withdrawal symptoms.  On reevaluation today patient is primarily complaining of alcohol withdrawal symptoms.  Feeling sick to her stomach very shaky with an obvious tremor.  Mood is anxious and depressed.  Admits to occasional suicidal thoughts but says that those have been present in the past and that she has no intention or plan of acting on them.  Denies any hallucinations or psychosis.  Patient had been taking Effexor for depression and anxiety but discontinued a week or more ago.  Father passed away not long ago which seems to have been at least part of the trigger for relapse and worsening of depression.  Past Psychiatric History: Past history no previous suicide attempts.  Effexor 75 mg for depression.  Patient says that she thinks she has had delirium tremens in the past no history of alcohol  withdrawal seizures.  She has had ongoing outpatient treatment through fellowship all.  Risk to Self:   Risk to Others:   Prior Inpatient Therapy:   Prior Outpatient Therapy:    Past Medical History:  Past Medical History:  Diagnosis Date   Depression    H/O dizziness    H/O eating disorder    H/O: alcohol abuse    Herpes    H/O   History of chicken pox    History of colon polyps    Hyperlipidemia    Hypertension    Kidney stones    H/O stones    Past Surgical History:  Procedure Laterality Date   ABDOMINAL HYSTERECTOMY  1998   APPENDECTOMY  1997   DILATION AND CURETTAGE OF UTERUS     TONSILLECTOMY  1997   Family History:  Family History  Problem Relation Age of Onset   Hyperlipidemia Mother    Hypertension Mother    Breast cancer Mother 23   Arthritis Father    Hyperlipidemia Father    Hypertension Father    Diabetes Father    Hypertension Brother    Arthritis Maternal Aunt    Hyperlipidemia Maternal Aunt    Hypertension Maternal Aunt    Alcohol abuse Maternal Uncle    Arthritis Maternal Uncle    Hyperlipidemia Maternal Uncle    Hypertension Maternal Uncle    Arthritis Paternal Aunt    Hyperlipidemia Paternal Aunt    Hypertension Paternal Aunt    Arthritis Paternal Uncle    Hyperlipidemia Paternal Uncle    Hypertension Paternal Uncle    Arthritis Maternal  Grandmother    Hyperlipidemia Maternal Grandmother    Hypertension Maternal Grandmother    Arthritis Maternal Grandfather    Lung cancer Maternal Grandfather    Prostate cancer Maternal Grandfather    Hyperlipidemia Maternal Grandfather    Stroke Maternal Grandfather    Hypertension Maternal Grandfather    Arthritis Paternal Grandmother    Hyperlipidemia Paternal Grandmother    Hypertension Paternal Grandmother    Alcohol abuse Paternal Grandfather    Arthritis Paternal Grandfather    Hyperlipidemia Paternal Grandfather    Hypertension Paternal Grandfather    Sudden death Paternal Grandfather     Family Psychiatric  History: See previous and above notes Social History:  Social History   Substance and Sexual Activity  Alcohol Use No   Comment: sober since 10/27/2010     Social History   Substance and Sexual Activity  Drug Use No    Social History   Socioeconomic History   Marital status: Single    Spouse name: Not on file   Number of children: Not on file   Years of education: Not on file   Highest education level: Not on file  Occupational History   Not on file  Tobacco Use   Smoking status: Never   Smokeless tobacco: Never  Substance and Sexual Activity   Alcohol use: No    Comment: sober since 10/27/2010   Drug use: No   Sexual activity: Not on file  Other Topics Concern   Not on file  Social History Narrative   Not on file   Social Determinants of Health   Financial Resource Strain: Not on file  Food Insecurity: Not on file  Transportation Needs: Not on file  Physical Activity: Not on file  Stress: Not on file  Social Connections: Not on file   Additional Social History:    Allergies:   Allergies  Allergen Reactions   Aleve [Naproxen Sodium] Hives    Labs:  Results for orders placed or performed during the hospital encounter of 04/03/23 (from the past 48 hour(s))  Comprehensive metabolic panel     Status: Abnormal   Collection Time: 04/03/23  3:21 AM  Result Value Ref Range   Sodium 136 135 - 145 mmol/L   Potassium 3.7 3.5 - 5.1 mmol/L   Chloride 99 98 - 111 mmol/L   CO2 25 22 - 32 mmol/L   Glucose, Bld 140 (H) 70 - 99 mg/dL    Comment: Glucose reference range applies only to samples taken after fasting for at least 8 hours.   BUN 11 6 - 20 mg/dL   Creatinine, Ser 1.61 0.44 - 1.00 mg/dL   Calcium 8.0 (L) 8.9 - 10.3 mg/dL   Total Protein 7.4 6.5 - 8.1 g/dL   Albumin 4.2 3.5 - 5.0 g/dL   AST 32 15 - 41 U/L   ALT 23 0 - 44 U/L   Alkaline Phosphatase 50 38 - 126 U/L   Total Bilirubin 0.7 0.3 - 1.2 mg/dL   GFR, Estimated >09 >60  mL/min    Comment: (NOTE) Calculated using the CKD-EPI Creatinine Equation (2021)    Anion gap 12 5 - 15    Comment: Performed at [facility], [address], [place], [place] [address]  Ethanol     Status: Abnormal   Collection Time: 04/03/23  3:21 AM  Result Value Ref Range   Alcohol, Ethyl (B) 307 (HH) <10 mg/dL    Comment: CRITICAL RESULT CALLED TO, READ BACK BY AND VERIFIED  WITH ASTIN REEVES@0422  04/03/23 RH (NOTE) Lowest detectable limit for serum alcohol is 10 mg/dL.  For medical purposes only. Performed at [facility], [address] [place], [place] [address]   cbc     Status: Abnormal   Collection Time: 04/03/23  3:21 AM  Result Value Ref Range   WBC 4.3 4.0 - 10.5 K/uL   RBC 5.14 (H) 3.87 - 5.11 MIL/uL   Hemoglobin 15.1 (H) 12.0 - 15.0 g/dL   HCT 78.2 95.6 - 21.3 %   MCV 87.7 80.0 - 100.0 fL   MCH 29.4 26.0 - 34.0 pg   MCHC 33.5 30.0 - 36.0 g/dL   RDW 08.6 57.8 - 46.9 %   Platelets 282 150 - 400 K/uL   nRBC 0.0 0.0 - 0.2 %    Comment: Performed at [facility], [address], [place], [place] [address]  Salicylate level     Status: Abnormal   Collection Time: 04/03/23  3:21 AM  Result Value Ref Range   Salicylate Lvl <7.0 (L) 7.0 - 30.0 mg/dL    Comment: Performed at [facility], [address] [place], [place] [address]  Acetaminophen level     Status: Abnormal   Collection Time: 04/03/23  3:21 AM  Result Value Ref Range   Acetaminophen (Tylenol), Serum <10 (L) 10 - 30 ug/mL    Comment: (NOTE) Therapeutic concentrations vary significantly. A range of 10-30 ug/mL  may be an effective concentration for many patients. However, some  are best treated at concentrations outside of this range. Acetaminophen concentrations >150 ug/mL at 4 hours after ingestion  and >50 ug/mL at 12 hours after ingestion are often associated with  toxic reactions.  Performed at [facility], [address]  [place], [place] [address]   Urine Drug Screen, Qualitative     Status: Abnormal   Collection Time: 04/03/23  4:25 AM  Result Value Ref Range   Tricyclic, Ur Screen NONE DETECTED NONE DETECTED   Amphetamines, Ur Screen NONE DETECTED NONE DETECTED   MDMA (Ecstasy)Ur Screen NONE DETECTED NONE DETECTED   Cocaine Metabolite,Ur [place] POSITIVE (A) NONE DETECTED   Opiate, Ur Screen NONE DETECTED NONE DETECTED   Phencyclidine (PCP) Ur S NONE DETECTED NONE DETECTED   Cannabinoid 50 Ng, Ur [place] NONE DETECTED NONE DETECTED   Barbiturates, Ur Screen NONE DETECTED NONE DETECTED   Benzodiazepine, Ur Scrn POSITIVE (A) NONE DETECTED   Methadone Scn, Ur NONE DETECTED NONE DETECTED    Comment: (NOTE) Tricyclics + metabolites, urine    Cutoff 1000 ng/mL Amphetamines + metabolites, urine  Cutoff 1000 ng/mL MDMA (Ecstasy), urine              Cutoff 500 ng/mL Cocaine Metabolite, urine          Cutoff 300 ng/mL Opiate + metabolites, urine        Cutoff 300 ng/mL Phencyclidine (PCP), urine         Cutoff 25 ng/mL Cannabinoid, urine                 Cutoff 50 ng/mL Barbiturates + metabolites, urine  Cutoff 200 ng/mL Benzodiazepine, urine              Cutoff 200 ng/mL Methadone, urine                   Cutoff 300 ng/mL  The urine drug screen provides only a preliminary, unconfirmed analytical test result and should not be used for non-medical  purposes. Clinical consideration and professional judgment should be applied to any positive drug screen result due to possible interfering substances. A more specific alternate chemical method must be used in order to obtain a confirmed analytical result. Gas chromatography / mass spectrometry (GC/MS) is the preferred confirm atory method. Performed at [facility], [address] [place], [place] [address]   HIV Antibody (routine testing w rflx)     Status: None   Collection Time: 04/04/23  4:25 AM  Result Value Ref Range   HIV Screen 4th Generation wRfx Non  Reactive Non Reactive    Comment: Performed at [facility], [address] [place], [place] [address]  Comprehensive metabolic panel     Status: Abnormal   Collection Time: 04/04/23  4:25 AM  Result Value Ref Range   Sodium 135 135 - 145 mmol/L   Potassium 3.5 3.5 - 5.1 mmol/L   Chloride 99 98 - 111 mmol/L   CO2 26 22 - 32 mmol/L   Glucose, Bld 122 (H) 70 - 99 mg/dL    Comment: Glucose reference range applies only to samples taken after fasting for at least 8 hours.   BUN 21 (H) 6 - 20 mg/dL   Creatinine, Ser 0.98 0.44 - 1.00 mg/dL   Calcium 8.5 (L) 8.9 - 10.3 mg/dL   Total Protein 6.8 6.5 - 8.1 g/dL   Albumin 3.9 3.5 - 5.0 g/dL   AST 48 (H) 15 - 41 U/L   ALT 27 0 - 44 U/L   Alkaline Phosphatase 58 38 - 126 U/L   Total Bilirubin 1.4 (H) 0.3 - 1.2 mg/dL   GFR, Estimated >11 >91 mL/min    Comment: (NOTE) Calculated using the CKD-EPI Creatinine Equation (2021)    Anion gap 10 5 - 15    Comment: Performed at [facility], [address] [place], [place] [address]  CBC     Status: None   Collection Time: 04/04/23  4:25 AM  Result Value Ref Range   WBC 6.4 4.0 - 10.5 K/uL   RBC 4.73 3.87 - 5.11 MIL/uL   Hemoglobin 13.9 12.0 - 15.0 g/dL   HCT 56.2 13.0 - 86.5 %   MCV 88.8 80.0 - 100.0 fL   MCH 29.4 26.0 - 34.0 pg   MCHC 33.1 30.0 - 36.0 g/dL   RDW 78.4 69.6 - 29.5 %   Platelets 189 150 - 400 K/uL   nRBC 0.0 0.0 - 0.2 %    Comment: Performed at [facility], [address] [place], [place] [address]    Current Facility-Administered Medications  Medication Dose Route Frequency Provider Last Rate Last Admin   0.9 % NaCl with KCl 20 mEq/ L  infusion   Intravenous Continuous Marrion Coy, MD 75 mL/hr at 04/04/23 0827 New Bag at 04/04/23 0827   acetaminophen (TYLENOL) tablet 650 mg  650 mg Oral Q6H PRN Mansy, Jan A, MD       Or   acetaminophen (TYLENOL) suppository 650 mg  650 mg Rectal Q6H PRN Mansy, Jan A, MD       chlordiazePOXIDE (LIBRIUM) capsule  25 mg  25 mg Oral Q6H PRN Mansy, Jan A, MD       enoxaparin (LOVENOX) injection 40 mg  40 mg Subcutaneous Q24H Mansy, Jan A, MD       folic acid (FOLVITE) tablet 1 mg  1 mg Oral Daily Bennett, Christal H, NP   1 mg at 04/04/23 0945  hydrOXYzine (ATARAX) tablet 25 mg  25 mg Oral Q6H PRN Mansy, Jan A, MD       loperamide (IMODIUM) capsule 2-4 mg  2-4 mg Oral PRN Mansy, Jan A, MD       LORazepam (ATIVAN) injection 0-4 mg  0-4 mg Intravenous Q6H Bennett, Christal H, NP   1 mg at 04/04/23 1238   Followed by   Melene Muller ON 04/05/2023] LORazepam (ATIVAN) injection 0-4 mg  0-4 mg Intravenous Q12H Bennett, Christal H, NP       LORazepam (ATIVAN) tablet 1-4 mg  1-4 mg Oral Q1H PRN Bennett, Christal H, NP       Or   LORazepam (ATIVAN) injection 1-4 mg  1-4 mg Intravenous Q1H PRN Bennett, Christal H, NP   1 mg at 04/04/23 0836   magnesium hydroxide (MILK OF MAGNESIA) suspension 30 mL  30 mL Oral Daily PRN Mansy, Jan A, MD       multivitamin with minerals tablet 1 tablet  1 tablet Oral Daily Bennett, Christal H, NP   1 tablet at 04/04/23 0945   ondansetron (ZOFRAN) tablet 4 mg  4 mg Oral Q6H PRN Mansy, Jan A, MD   4 mg at 04/04/23 1610   Or   ondansetron (ZOFRAN) injection 4 mg  4 mg Intravenous Q6H PRN Mansy, Jan A, MD       thiamine (VITAMIN B1) tablet 100 mg  100 mg Oral Daily Bennett, Christal H, NP   100 mg at 04/04/23 0945   Or   thiamine (VITAMIN B1) injection 100 mg  100 mg Intravenous Daily Bennett, Christal H, NP       traZODone (DESYREL) tablet 50 mg  50 mg Oral QHS PRN Bennett, Christal H, NP   50 mg at 04/04/23 0145   venlafaxine XR (EFFEXOR-XR) 24 hr capsule 75 mg  75 mg Oral Daily Katalia Choma, Jackquline Denmark, MD   75 mg at 04/04/23 1404   Current Outpatient Medications  Medication Sig Dispense Refill   acyclovir (ZOVIRAX) 400 MG tablet TAKE 1 TABLET BY MOUTH TWICE A DAY 180 tablet 1   traZODone (DESYREL) 50 MG tablet TAKE 1-2 TABLETS EVERY NIGHT 180 tablet 1   venlafaxine XR (EFFEXOR-XR) 75 MG 24 hr  capsule TAKE 1 CAPSULE BY MOUTH DAILY WITH BREAKFAST. 90 capsule 1    Musculoskeletal: Strength & Muscle Tone: within normal limits Gait & Station: unsteady Patient leans: N/A            Psychiatric Specialty Exam:  Presentation  General Appearance: No data recorded Eye Contact:No data recorded Speech:No data recorded Speech Volume:No data recorded Handedness:No data recorded  Mood and Affect  Mood:No data recorded Affect:No data recorded  Thought Process  Thought Processes:No data recorded Descriptions of Associations:No data recorded Orientation:No data recorded Thought Content:No data recorded History of Schizophrenia/Schizoaffective disorder:No  Duration of Psychotic Symptoms:No data recorded Hallucinations:No data recorded Ideas of Reference:No data recorded Suicidal Thoughts:No data recorded Homicidal Thoughts:No data recorded  Sensorium  Memory:No data recorded Judgment:No data recorded Insight:No data recorded  Executive Functions  Concentration:No data recorded Attention Span:No data recorded Recall:No data recorded Fund of Knowledge:No data recorded Language:No data recorded  Psychomotor Activity  Psychomotor Activity:No data recorded  Assets  Assets:No data recorded  Sleep  Sleep:No data recorded  Physical Exam: Physical Exam Vitals and nursing note reviewed.  Constitutional:      Appearance: Normal appearance.  HENT:     Head: Normocephalic and atraumatic.     Mouth/Throat:  Pharynx: Oropharynx is clear.  Eyes:     Pupils: Pupils are equal, round, and reactive to light.  Cardiovascular:     Rate and Rhythm: Normal rate and regular rhythm.  Pulmonary:     Effort: Pulmonary effort is normal.     Breath sounds: Normal breath sounds.  Abdominal:     General: Abdomen is flat.     Palpations: Abdomen is soft.  Musculoskeletal:        General: Normal range of motion.  Skin:    General: Skin is warm and dry.  Neurological:      General: No focal deficit present.     Mental Status: She is alert. Mental status is at baseline.  Psychiatric:        Attention and Perception: Attention normal.        Mood and Affect: Mood is anxious and depressed. Affect is blunt.        Speech: Speech is delayed.        Behavior: Behavior is cooperative.        Thought Content: Thought content includes suicidal ideation. Thought content does not include suicidal plan.        Cognition and Memory: Memory is impaired.        Judgment: Judgment normal.    Review of Systems  Constitutional: Negative.   HENT: Negative.    Eyes: Negative.   Respiratory: Negative.    Cardiovascular: Negative.   Gastrointestinal:  Positive for nausea.  Musculoskeletal: Negative.   Skin: Negative.   Neurological:  Positive for tremors.  Psychiatric/Behavioral:  Positive for depression and substance abuse. The patient is nervous/anxious.    Blood pressure (!) 179/97, pulse 94, temperature 98.8 F (37.1 C), resp. rate 18, height 5\' 4"  (1.626 m), weight 65.8 kg, SpO2 99 %. Body mass index is 24.89 kg/m.  Treatment Plan Summary: Medication management and Plan I have ordered 1 time dose of Librium as she is obviously shaky and having detox symptoms now also 1 time and as needed dose of Zofran.  Patient is being admitted to the medical service for management of alcohol withdrawal symptoms.  She does not require suicide precautions as at this point she is not endorsing any intent or plan to harm her self and appears to be motivated and willing to engage in treatment.  Restart Effexor extended release 75 mg/day.  Please place a new consult when she is admitted to the floor if he would like to have further follow-up.  Unclear at this time whether she will need psychiatric hospitalization after medical issues are addressed.  Disposition: No evidence of imminent risk to self or others at present.   Supportive therapy provided about ongoing stressors.  Mordecai Rasmussen, MD 04/04/2023 2:05 PM

## 2023-04-05 DIAGNOSIS — F332 Major depressive disorder, recurrent severe without psychotic features: Secondary | ICD-10-CM | POA: Diagnosis not present

## 2023-04-05 DIAGNOSIS — F10931 Alcohol use, unspecified with withdrawal delirium: Secondary | ICD-10-CM | POA: Diagnosis not present

## 2023-04-05 DIAGNOSIS — R45851 Suicidal ideations: Secondary | ICD-10-CM | POA: Diagnosis not present

## 2023-04-05 LAB — PHOSPHORUS: Phosphorus: 2.6 mg/dL (ref 2.5–4.6)

## 2023-04-05 LAB — BASIC METABOLIC PANEL
Anion gap: 6 (ref 5–15)
BUN: 13 mg/dL (ref 6–20)
CO2: 25 mmol/L (ref 22–32)
Calcium: 8.1 mg/dL — ABNORMAL LOW (ref 8.9–10.3)
Chloride: 107 mmol/L (ref 98–111)
Creatinine, Ser: 0.66 mg/dL (ref 0.44–1.00)
GFR, Estimated: >60 mL/min (ref >60)
Glucose, Bld: 107 mg/dL — ABNORMAL HIGH (ref 70–99)
Potassium: 3.7 mmol/L (ref 3.5–5.1)
Sodium: 138 mmol/L (ref 135–145)

## 2023-04-05 LAB — MAGNESIUM: Magnesium: 2.1 mg/dL (ref 1.7–2.4)

## 2023-04-05 MED ORDER — CLONIDINE HCL 0.1 MG PO TABS
0.1000 mg | ORAL_TABLET | Freq: Two times a day (BID) | ORAL | Status: DC
  Administered 2023-04-05 – 2023-04-06 (×3): 0.1 mg via ORAL
  Filled 2023-04-05 (×3): qty 1

## 2023-04-05 MED ORDER — PROCHLORPERAZINE EDISYLATE 10 MG/2ML IJ SOLN
10.0000 mg | Freq: Once | INTRAMUSCULAR | Status: AC
  Administered 2023-04-05: 10 mg via INTRAVENOUS
  Filled 2023-04-05: qty 2

## 2023-04-05 MED ORDER — CHLORDIAZEPOXIDE HCL 25 MG PO CAPS
25.0000 mg | ORAL_CAPSULE | Freq: Three times a day (TID) | ORAL | Status: DC
  Administered 2023-04-05 – 2023-04-06 (×5): 25 mg via ORAL
  Filled 2023-04-05 (×5): qty 1

## 2023-04-05 NOTE — Progress Notes (Signed)
  Progress Note   Patient: Tracy Phillips ZOX:[id number] DOB: [birth year] DOA: 04/03/2023     1 DOS: the patient was seen and examined on 04/05/2023   Brief hospital course: DILA HOPWOOD is a 57 y.o. female with medical history significant for alcohol abuse, hypertension, dyslipidemia, depression and colonic polyp, who presented to the emergency room with acute onset of alcohol intoxication.  Patient developed evidence of alcohol withdrawal, was a placed on CIWA protocol.  Patient also had a suicide ideation, consult from psychiatry is obtained.   Principal Problem:   Alcohol withdrawal (HCC) Active Problems:   Major depressive disorder, recurrent episode, severe (HCC)   Suicidal ideation   Alcohol abuse   Anxiety   Alcoholic intoxication with complication (HCC)   Alcohol withdrawal delirium (HCC)   Assessment and Plan:  * Alcohol withdrawal (HCC) Patient had increased CIWA score today with more tremor and some confusion.  Not ready for discharge, will continue Librium and as needed Ativan. Continue IV fluids.  Hypertension. Most likely due to withdrawal, started clonidine 0.1 mg twice a day.   Major depressive disorder, recurrent episode, severe (HCC) Suicidal ideation Psychiatry wants the patient transferred to mental health, currently not stable for transfer.  Will continue to monitor.     Subjective:  Patient has some confusion today was a significant tremor.  No shortness of breath.  Physical Exam: Vitals:   04/05/23 0008 04/05/23 0354 04/05/23 0833 04/05/23 1347  BP: (!) 172/89 (!) 144/81 (!) 150/91 (!) 186/100  Pulse: 82 84 69   Resp: 16 17 19    Temp:   98.7 F (37.1 C) 97.7 F (36.5 C)  TempSrc:    Oral  SpO2: 100% 100% 99% 100%  Weight:      Height:       General exam: Appears calm and comfortable  Respiratory system: Clear to auscultation. Respiratory effort normal. Cardiovascular system: S1 & S2 heard, RRR. No JVD, murmurs, rubs, gallops or clicks.  No pedal edema. Gastrointestinal system: Abdomen is nondistended, soft and nontender. No organomegaly or masses felt. Normal bowel sounds heard. Central nervous system: Alert and oriented x2. Tremor Extremities: Symmetric 5 x 5 power. Skin: No rashes, lesions or ulcers Psychiatry: Mood & affect appropriate.    Data Reviewed:  Lab results reviewed.  Family Communication: None  Disposition: Status is: Inpatient Remains inpatient appropriate because: Severity of disease, IV treatment.     Time spent: 35 minutes  Author: Marrion Coy, MD 04/05/2023 1:58 PM  For on call review [email or web address].

## 2023-04-05 NOTE — TOC CM/SW Note (Signed)
SA resources added to be included on AVS.  Alfonso Ramus, LCSW [phone]

## 2023-04-05 NOTE — Progress Notes (Signed)
Mobility Specialist - Progress Note      04/05/23 1300  Mobility  Activity Ambulated with assistance in hallway  Level of Assistance Contact guard assist, steadying assist  Assistive Device Other (Comment) (IV Pole)  Distance Ambulated (ft) 30 ft  Range of Motion/Exercises Active  Activity Response Tolerated well  Mobility Referral Yes  $Mobility charge 1 Mobility  Mobility Specialist Start Time (ACUTE ONLY) 1300  Mobility Specialist Stop Time (ACUTE ONLY) 1320  Mobility Specialist Time Calculation (min) (ACUTE ONLY) 20 min   Pt sitting forward in bed with her head in her hands on RA upon entry. Pt STS and ambulates to hallway holding onto IV Pole for support CGA (due to sudden instability). Pt initially had no AD but had a brief stumble at the door and recovered quickly after leaning against wall. Pt endorsed dizziness after 20 ft and said "I see stars". Pt returned to bed and left with needs in reach. RN notified of dizziness.   Johnathan Hausen Mobility Specialist 04/05/23, 1:30 PM

## 2023-04-05 NOTE — Discharge Instructions (Signed)
                  Intensive Outpatient Programs  [facility]    [facility] [address]     [address] [place],  [place]     [place], [place] [phone]      [phone]  [facility]   [facility]  (Inpatient and outpatient)  [phone] (Suboxone and Methadone) [address]           [phone]           [facility]    [facility] - Intensive Outpatient [address]     [address] [place], [place] [address]     [place], [place]  [phone]      [phone]  [facility] (Outpatient, Inpatient, [facility]  [facility] (Groups and Residental) (insurance only) [phone]    [place], [place]          [phone]       [facility]    [facility] (for caregivers and family) [address]     [address] [place], [place]     [place], [place] [phone]      [phone]  Residential Treatment Programs  [facility]  [facility](2 years) Residential: 90 days)  [facility] [address]      [address] [place], [place]     [place], [place] [phone]      [phone] or [phone]  [facility]    [facility] [address]      [address] [place], [place]     [place], [place] [phone]      [phone]  [facility]   [facility] ([facility] [address]     [address] [place], [place] [address]     [place], [place] [phone]      [phone] Admissions: 8am-3pm M-F  [facility]: Residential Program (90 Days)              [facility]: [facility]  [place], [place]     [place],   [phone] or [phone]    (Walk in Hours over the weekend or by referral)   [facility]: [facility]:[phone] (for crisis  response 24 hours a day) 

## 2023-04-06 ENCOUNTER — Inpatient Hospital Stay (HOSPITAL_COMMUNITY)
Admission: RE | Admit: 2023-04-06 | Discharge: 2023-04-13 | DRG: 885 | Disposition: A | Discharge status: Home / Self Care | Payer: BC Managed Care – PPO | Source: Intra-hospital | Attending: Psychiatry | Admitting: Psychiatry

## 2023-04-06 ENCOUNTER — Encounter (HOSPITAL_COMMUNITY): Payer: Self-pay

## 2023-04-06 ENCOUNTER — Other Ambulatory Visit: Payer: Self-pay

## 2023-04-06 DIAGNOSIS — F332 Major depressive disorder, recurrent severe without psychotic features: Secondary | ICD-10-CM | POA: Diagnosis not present

## 2023-04-06 DIAGNOSIS — R45851 Suicidal ideations: Secondary | ICD-10-CM | POA: Diagnosis present

## 2023-04-06 DIAGNOSIS — Z801 Family history of malignant neoplasm of trachea, bronchus and lung: Secondary | ICD-10-CM

## 2023-04-06 DIAGNOSIS — Z79899 Other long term (current) drug therapy: Secondary | ICD-10-CM

## 2023-04-06 DIAGNOSIS — Z8719 Personal history of other diseases of the digestive system: Secondary | ICD-10-CM

## 2023-04-06 DIAGNOSIS — E785 Hyperlipidemia, unspecified: Secondary | ICD-10-CM | POA: Diagnosis not present

## 2023-04-06 DIAGNOSIS — Z83438 Family history of other disorder of lipoprotein metabolism and other lipidemia: Secondary | ICD-10-CM | POA: Diagnosis not present

## 2023-04-06 DIAGNOSIS — Z8261 Family history of arthritis: Secondary | ICD-10-CM

## 2023-04-06 DIAGNOSIS — K219 Gastro-esophageal reflux disease without esophagitis: Secondary | ICD-10-CM | POA: Diagnosis not present

## 2023-04-06 DIAGNOSIS — Z8249 Family history of ischemic heart disease and other diseases of the circulatory system: Secondary | ICD-10-CM | POA: Diagnosis not present

## 2023-04-06 DIAGNOSIS — Z8659 Personal history of other mental and behavioral disorders: Secondary | ICD-10-CM

## 2023-04-06 DIAGNOSIS — Z886 Allergy status to analgesic agent status: Secondary | ICD-10-CM

## 2023-04-06 DIAGNOSIS — F1090 Alcohol use, unspecified, uncomplicated: Secondary | ICD-10-CM | POA: Diagnosis present

## 2023-04-06 DIAGNOSIS — Z803 Family history of malignant neoplasm of breast: Secondary | ICD-10-CM | POA: Diagnosis not present

## 2023-04-06 DIAGNOSIS — G4700 Insomnia, unspecified: Secondary | ICD-10-CM | POA: Diagnosis present

## 2023-04-06 DIAGNOSIS — Z634 Disappearance and death of family member: Secondary | ICD-10-CM

## 2023-04-06 DIAGNOSIS — K5900 Constipation, unspecified: Secondary | ICD-10-CM | POA: Diagnosis not present

## 2023-04-06 DIAGNOSIS — F1020 Alcohol dependence, uncomplicated: Secondary | ICD-10-CM | POA: Diagnosis present

## 2023-04-06 DIAGNOSIS — Z833 Family history of diabetes mellitus: Secondary | ICD-10-CM

## 2023-04-06 DIAGNOSIS — Z87442 Personal history of urinary calculi: Secondary | ICD-10-CM

## 2023-04-06 DIAGNOSIS — F1010 Alcohol abuse, uncomplicated: Secondary | ICD-10-CM | POA: Diagnosis present

## 2023-04-06 DIAGNOSIS — F32A Depression, unspecified: Secondary | ICD-10-CM | POA: Diagnosis present

## 2023-04-06 DIAGNOSIS — I10 Essential (primary) hypertension: Secondary | ICD-10-CM | POA: Diagnosis not present

## 2023-04-06 DIAGNOSIS — F411 Generalized anxiety disorder: Secondary | ICD-10-CM | POA: Diagnosis present

## 2023-04-06 DIAGNOSIS — F10939 Alcohol use, unspecified with withdrawal, unspecified: Secondary | ICD-10-CM | POA: Diagnosis present

## 2023-04-06 DIAGNOSIS — Z811 Family history of alcohol abuse and dependence: Secondary | ICD-10-CM

## 2023-04-06 DIAGNOSIS — Z823 Family history of stroke: Secondary | ICD-10-CM

## 2023-04-06 DIAGNOSIS — F419 Anxiety disorder, unspecified: Secondary | ICD-10-CM | POA: Diagnosis present

## 2023-04-06 DIAGNOSIS — Z5986 Financial insecurity: Secondary | ICD-10-CM

## 2023-04-06 DIAGNOSIS — Z86010 Personal history of colonic polyps: Secondary | ICD-10-CM | POA: Diagnosis not present

## 2023-04-06 DIAGNOSIS — F1021 Alcohol dependence, in remission: Secondary | ICD-10-CM | POA: Diagnosis present

## 2023-04-06 DIAGNOSIS — Z90710 Acquired absence of both cervix and uterus: Secondary | ICD-10-CM

## 2023-04-06 DIAGNOSIS — F10931 Alcohol use, unspecified with withdrawal delirium: Secondary | ICD-10-CM | POA: Diagnosis not present

## 2023-04-06 LAB — BASIC METABOLIC PANEL
Anion gap: 7 (ref 5–15)
BUN: 14 mg/dL (ref 6–20)
CO2: 26 mmol/L (ref 22–32)
Calcium: 8.5 mg/dL — ABNORMAL LOW (ref 8.9–10.3)
Chloride: 106 mmol/L (ref 98–111)
Creatinine, Ser: 0.64 mg/dL (ref 0.44–1.00)
GFR, Estimated: >60 mL/min (ref >60)
Glucose, Bld: 120 mg/dL — ABNORMAL HIGH (ref 70–99)
Potassium: 3.8 mmol/L (ref 3.5–5.1)
Sodium: 139 mmol/L (ref 135–145)

## 2023-04-06 LAB — MAGNESIUM: Magnesium: 2 mg/dL (ref 1.7–2.4)

## 2023-04-06 MED ORDER — MAGNESIUM HYDROXIDE 400 MG/5ML PO SUSP: 30.0000 mL | Freq: Every day | ORAL | Status: DC | PRN

## 2023-04-06 MED ORDER — VITAMIN B-1 100 MG PO TABS
100.0000 mg | ORAL_TABLET | Freq: Every day | ORAL | Status: DC
  Administered 2023-04-07 – 2023-04-13 (×7): 100 mg via ORAL
  Filled 2023-04-06 (×10): qty 1

## 2023-04-06 MED ORDER — FOLIC ACID 1 MG PO TABS: 1.0000 mg | ORAL_TABLET | Freq: Every day | ORAL | Status: DC

## 2023-04-06 MED ORDER — ADULT MULTIVITAMIN W/MINERALS CH
1.0000 | ORAL_TABLET | Freq: Every day | ORAL | Status: DC
  Administered 2023-04-07 – 2023-04-13 (×7): 1 via ORAL
  Filled 2023-04-06 (×10): qty 1

## 2023-04-06 MED ORDER — VITAMIN B-1 100 MG PO TABS: 100.0000 mg | ORAL_TABLET | Freq: Every day | ORAL | Status: DC

## 2023-04-06 MED ORDER — THIAMINE HCL 100 MG/ML IJ SOLN: 100.0000 mg | Freq: Every day | INTRAMUSCULAR | Status: DC

## 2023-04-06 MED ORDER — ACETAMINOPHEN 325 MG PO TABS
650.0000 mg | ORAL_TABLET | Freq: Four times a day (QID) | ORAL | Status: DC | PRN
  Administered 2023-04-08 – 2023-04-13 (×4): 650 mg via ORAL
  Filled 2023-04-06 (×4): qty 2

## 2023-04-06 MED ORDER — TRAZODONE HCL 50 MG PO TABS
50.0000 mg | ORAL_TABLET | Freq: Every evening | ORAL | Status: DC | PRN
  Administered 2023-04-06: 50 mg via ORAL
  Filled 2023-04-06 (×2): qty 1

## 2023-04-06 MED ORDER — HYDROXYZINE HCL 50 MG PO TABS
50.0000 mg | ORAL_TABLET | Freq: Three times a day (TID) | ORAL | Status: DC | PRN
  Administered 2023-04-07 – 2023-04-08 (×4): 50 mg via ORAL
  Filled 2023-04-06 (×4): qty 1

## 2023-04-06 MED ORDER — LORAZEPAM 2 MG/ML IJ SOLN: 1.0000 mg | INTRAMUSCULAR | Status: DC | PRN

## 2023-04-06 MED ORDER — LORAZEPAM 1 MG PO TABS
0.0000 mg | ORAL_TABLET | Freq: Four times a day (QID) | ORAL | Status: DC
  Administered 2023-04-06 – 2023-04-07 (×2): 2 mg via ORAL
  Filled 2023-04-06 (×2): qty 1
  Filled 2023-04-06: qty 2

## 2023-04-06 MED ORDER — LORAZEPAM 1 MG PO TABS: 1.0000 mg | ORAL_TABLET | ORAL | 0 refills | Status: DC | PRN

## 2023-04-06 MED ORDER — ONDANSETRON HCL 4 MG PO TABS
4.0000 mg | ORAL_TABLET | Freq: Three times a day (TID) | ORAL | Status: DC | PRN
  Administered 2023-04-06 – 2023-04-10 (×6): 4 mg via ORAL
  Filled 2023-04-06 (×7): qty 1

## 2023-04-06 MED ORDER — HALOPERIDOL LACTATE 5 MG/ML IJ SOLN: 5.0000 mg | Freq: Three times a day (TID) | INTRAMUSCULAR | Status: DC | PRN

## 2023-04-06 MED ORDER — DIPHENHYDRAMINE HCL 50 MG/ML IJ SOLN: 50.0000 mg | Freq: Three times a day (TID) | INTRAMUSCULAR | Status: DC | PRN

## 2023-04-06 MED ORDER — CHLORDIAZEPOXIDE HCL 25 MG PO CAPS: 25.0000 mg | ORAL_CAPSULE | Freq: Three times a day (TID) | ORAL | 0 refills | Status: DC

## 2023-04-06 MED ORDER — DIPHENHYDRAMINE HCL 25 MG PO CAPS
50.0000 mg | ORAL_CAPSULE | Freq: Three times a day (TID) | ORAL | Status: DC | PRN
  Administered 2023-04-07: 50 mg via ORAL
  Filled 2023-04-06 (×2): qty 2

## 2023-04-06 MED ORDER — LORAZEPAM 1 MG PO TABS: 0.0000 mg | ORAL_TABLET | Freq: Two times a day (BID) | ORAL | Status: DC

## 2023-04-06 MED ORDER — ALUM & MAG HYDROXIDE-SIMETH 200-200-20 MG/5ML PO SUSP: 30.0000 mL | ORAL | Status: DC | PRN

## 2023-04-06 MED ORDER — FOLIC ACID 1 MG PO TABS
1.0000 mg | ORAL_TABLET | Freq: Every day | ORAL | Status: DC
  Administered 2023-04-07 – 2023-04-13 (×7): 1 mg via ORAL
  Filled 2023-04-06 (×10): qty 1

## 2023-04-06 MED ORDER — HALOPERIDOL 5 MG PO TABS
5.0000 mg | ORAL_TABLET | Freq: Three times a day (TID) | ORAL | Status: DC | PRN
  Administered 2023-04-07: 5 mg via ORAL
  Filled 2023-04-06 (×2): qty 1

## 2023-04-06 MED ORDER — LORAZEPAM 1 MG PO TABS: 1.0000 mg | ORAL_TABLET | ORAL | Status: DC | PRN
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
## 2023-04-06 NOTE — Plan of Care (Signed)

## 2023-04-06 NOTE — Discharge Summary (Signed)
Physician Discharge Summary   Patient: Tracy Phillips MRN: [id number] DOB: Jul 22, 1966  Admit date:     04/03/2023  Discharge date: 04/06/23  Discharge Physician: Marrion Coy   PCP: [place] Wilson, MD   Recommendations at discharge:      Discharge Diagnoses: Principal Problem:   Alcohol withdrawal (HCC) Active Problems:   Major depressive disorder, recurrent episode, severe (HCC)   Suicidal ideation   Alcohol abuse   Anxiety   Alcoholic intoxication with complication (HCC)   Alcohol withdrawal delirium (HCC)  Resolved Problems:   * No resolved hospital problems. *  Hospital Course: Tracy Phillips is a 57 y.o. female with medical history significant for alcohol abuse, hypertension, dyslipidemia, depression and colonic polyp, who presented to the emergency room with acute onset of alcohol intoxication.  Patient developed evidence of alcohol withdrawal, was a placed on CIWA protocol.  Patient also had a suicide ideation, consult from psychiatry is obtained. Patient is treated with Librium and CIWA protocol.  Condition has improved.  Patient currently excepted to psychiatry unit in [facility].  Assessment and Plan: * Alcohol withdrawal (HCC) Patient condition has improved, no confusion.  Appetite is better.  Currently medically stable to be transferred to mental health.   Hypertension. Appears to be secondary to alcohol withdrawal, no need for blood pressure medicine at this time.   Major depressive disorder, recurrent episode, severe (HCC) Suicidal ideation Patient be transferred to psych unit for further treatment.         Consultants: Psych Procedures performed: None  Disposition:  psych unit Diet recommendation:  Discharge Diet Orders (From admission, onward)     Start     Ordered   04/06/23 0000  Diet - low sodium heart healthy        04/06/23 1715           Cardiac diet DISCHARGE MEDICATION: Allergies as of 04/06/2023       Reactions   Aleve  [naproxen Sodium] Hives        Medication List     TAKE these medications    acyclovir 400 MG tablet Commonly known as: ZOVIRAX TAKE 1 TABLET BY MOUTH TWICE A DAY   chlordiazePOXIDE 25 MG capsule Commonly known as: LIBRIUM Take 1 capsule (25 mg total) by mouth 3 (three) times daily for 3 days.   folic acid 1 MG tablet Commonly known as: FOLVITE Take 1 tablet (1 mg total) by mouth daily. Start taking on: Apr 07, 2023   LORazepam 1 MG tablet Commonly known as: ATIVAN Take 1-4 tablets (1-4 mg total) by mouth every hour as needed (withdrawal symptoms:  anxiety, agitation, insomnia, diaphoresis, nausea, vomiting, tremors, tachycardia, or hypertension.).   thiamine 100 MG tablet Commonly known as: Vitamin B-1 Take 1 tablet (100 mg total) by mouth daily. Start taking on: Apr 07, 2023   traZODone 50 MG tablet Commonly known as: DESYREL TAKE 1-2 TABLETS EVERY NIGHT   venlafaxine XR 75 MG 24 hr capsule Commonly known as: EFFEXOR-XR TAKE 1 CAPSULE BY MOUTH DAILY WITH BREAKFAST.        Discharge Exam: Filed Weights   04/04/23 0801  Weight: 65.8 kg   General exam: Appears calm and comfortable  Respiratory system: Clear to auscultation. Respiratory effort normal. Cardiovascular system: S1 & S2 heard, RRR. No JVD, murmurs, rubs, gallops or clicks. No pedal edema. Gastrointestinal system: Abdomen is nondistended, soft and nontender. No organomegaly or masses felt. Normal bowel sounds heard. Central nervous system: Alert and oriented. No  focal neurological deficits. Extremities: Symmetric 5 x 5 power. Skin: No rashes, lesions or ulcers Psychiatry: Judgement and insight appear normal. Mood & affect appropriate.    Condition at discharge: good  The results of significant diagnostics from this hospitalization (including imaging, microbiology, ancillary and laboratory) are listed below for reference.   Imaging Studies: No results found.  Microbiology: No results found  for this or any previous visit.  Labs: CBC: Recent Labs  Lab 04/03/23 0321 04/04/23 0425  WBC 4.3 6.4  HGB 15.1* 13.9  HCT 45.1 42.0  MCV 87.7 88.8  PLT 282 189   Basic Metabolic Panel: Recent Labs  Lab 04/03/23 0321 04/04/23 0425 04/05/23 0432 04/06/23 0425  NA 136 135 138 139  K 3.7 3.5 3.7 3.8  CL 99 99 107 106  CO2 25 26 25 26   GLUCOSE 140* 122* 107* 120*  BUN 11 21* 13 14  CREATININE 0.60 0.83 0.66 0.64  CALCIUM 8.0* 8.5* 8.1* 8.5*  MG  --   --  2.1 2.0  PHOS  --   --  2.6  --    Liver Function Tests: Recent Labs  Lab 04/03/23 0321 04/04/23 0425  AST 32 48*  ALT 23 27  ALKPHOS 50 58  BILITOT 0.7 1.4*  PROT 7.4 6.8  ALBUMIN 4.2 3.9   CBG: No results for input(s): "GLUCAP" in the last 168 hours.  Discharge time spent: greater than 30 minutes.  Signed: Marrion Coy, MD [facility] 04/06/2023

## 2023-04-06 NOTE — BH Assessment (Signed)
Patient has been accepted to [facility]  Patient assigned to room 400-01 Accepting physician is Dr. Dr. Abbott Pao..  Call report to [phone].  Representative was [place], [facility].   Unit Staff is aware of it:  Dr. Brayton Mars, Patient's Nurse       VOLUNTARY admission forms have been signed and faxed to [facility].    Admission is scheduled for 9PM.

## 2023-04-06 NOTE — Progress Notes (Addendum)
Pt given several doses of ativan and requesting another dose within 30 min of last dose. Told pt had to wait and hour and threatening to leave ama. Np b.morrison notified. Pt mother not answering phone so pt agreed to stay and ativan dose given at appropriate time. Pt refusing iv fluids related to pump beeping and causing anxiety. Pump disconnected.  Cont to monitor

## 2023-04-06 NOTE — Progress Notes (Signed)
Marisue Ivan from behavioral health hospital at [facility] given report of patient, packet given to driver from [employer].

## 2023-04-07 ENCOUNTER — Encounter (HOSPITAL_COMMUNITY): Payer: Self-pay

## 2023-04-07 DIAGNOSIS — F411 Generalized anxiety disorder: Secondary | ICD-10-CM | POA: Diagnosis present

## 2023-04-07 DIAGNOSIS — K219 Gastro-esophageal reflux disease without esophagitis: Secondary | ICD-10-CM | POA: Diagnosis present

## 2023-04-07 DIAGNOSIS — F1021 Alcohol dependence, in remission: Secondary | ICD-10-CM | POA: Diagnosis present

## 2023-04-07 DIAGNOSIS — F419 Anxiety disorder, unspecified: Secondary | ICD-10-CM | POA: Diagnosis present

## 2023-04-07 DIAGNOSIS — F1090 Alcohol use, unspecified, uncomplicated: Secondary | ICD-10-CM | POA: Diagnosis present

## 2023-04-07 DIAGNOSIS — F1020 Alcohol dependence, uncomplicated: Secondary | ICD-10-CM | POA: Diagnosis present

## 2023-04-07 MED ORDER — HYDROXYZINE HCL 50 MG PO TABS: 50.0000 mg | ORAL_TABLET | Freq: Three times a day (TID) | ORAL | 0 refills | Status: DC | PRN

## 2023-04-07 MED ORDER — ONDANSETRON 4 MG PO TBDP
4.0000 mg | ORAL_TABLET | Freq: Once | ORAL | Status: AC
  Administered 2023-04-07: 4 mg via ORAL
  Filled 2023-04-07: qty 1

## 2023-04-07 MED ORDER — CHLORDIAZEPOXIDE HCL 25 MG PO CAPS
25.0000 mg | ORAL_CAPSULE | Freq: Four times a day (QID) | ORAL | Status: AC | PRN
  Administered 2023-04-10: 25 mg via ORAL

## 2023-04-07 MED ORDER — LORAZEPAM 1 MG PO TABS: 1.0000 mg | ORAL_TABLET | Freq: Two times a day (BID) | ORAL | Status: DC

## 2023-04-07 MED ORDER — TRAZODONE HCL 50 MG PO TABS
50.0000 mg | ORAL_TABLET | Freq: Every day | ORAL | Status: DC
  Administered 2023-04-07: 50 mg via ORAL
  Filled 2023-04-07 (×4): qty 1

## 2023-04-07 MED ORDER — CHLORDIAZEPOXIDE HCL 25 MG PO CAPS
25.0000 mg | ORAL_CAPSULE | Freq: Every day | ORAL | Status: AC
  Administered 2023-04-10: 25 mg via ORAL
  Filled 2023-04-07: qty 1

## 2023-04-07 MED ORDER — LORAZEPAM 0.5 MG PO TABS: 0.5000 mg | ORAL_TABLET | Freq: Two times a day (BID) | ORAL | Status: DC

## 2023-04-07 MED ORDER — CHLORDIAZEPOXIDE HCL 25 MG PO CAPS
25.0000 mg | ORAL_CAPSULE | ORAL | Status: AC
  Administered 2023-04-09 (×2): 25 mg via ORAL
  Filled 2023-04-07 (×2): qty 1

## 2023-04-07 MED ORDER — CHLORDIAZEPOXIDE HCL 25 MG PO CAPS
25.0000 mg | ORAL_CAPSULE | Freq: Three times a day (TID) | ORAL | Status: AC
  Administered 2023-04-08 (×3): 25 mg via ORAL
  Filled 2023-04-07 (×3): qty 1

## 2023-04-07 MED ORDER — PANTOPRAZOLE SODIUM 40 MG PO TBEC
40.0000 mg | DELAYED_RELEASE_TABLET | Freq: Every day | ORAL | Status: DC
  Administered 2023-04-07 – 2023-04-13 (×7): 40 mg via ORAL
  Filled 2023-04-07 (×10): qty 1

## 2023-04-07 MED ORDER — LORAZEPAM 1 MG PO TABS
1.0000 mg | ORAL_TABLET | Freq: Three times a day (TID) | ORAL | Status: DC
  Administered 2023-04-07: 1 mg via ORAL
  Filled 2023-04-07: qty 1

## 2023-04-07 MED ORDER — CHLORDIAZEPOXIDE HCL 25 MG PO CAPS
25.0000 mg | ORAL_CAPSULE | Freq: Four times a day (QID) | ORAL | Status: AC
  Administered 2023-04-07 (×3): 25 mg via ORAL
  Filled 2023-04-07 (×3): qty 1

## 2023-04-07 MED ORDER — LOPERAMIDE HCL 2 MG PO CAPS: 2.0000 mg | ORAL_CAPSULE | ORAL | Status: AC | PRN

## 2023-04-07 MED ORDER — VITAMIN B-1 100 MG PO TABS
100.0000 mg | ORAL_TABLET | Freq: Every day | ORAL | Status: DC
  Filled 2023-04-07 (×2): qty 1

## 2023-04-07 MED ORDER — DULOXETINE HCL 60 MG PO CPEP
60.0000 mg | ORAL_CAPSULE | Freq: Every day | ORAL | Status: DC
  Administered 2023-04-07 – 2023-04-10 (×4): 60 mg via ORAL
  Filled 2023-04-07 (×7): qty 1

## 2023-04-07 NOTE — Tx Team (Signed)
Initial Treatment Plan 04/07/2023 12:06 AM Tracy Phillips ZOX:[id number]    PATIENT STRESSORS: Loss of father   Substance abuse   Traumatic event     PATIENT STRENGTHS: Average or above average intelligence  Capable of independent living  Communication skills  Supportive family/friends    PATIENT IDENTIFIED PROBLEMS: Depression  Suicidal Ideation  Relapse on ETOH after 13 years sobriety                 DISCHARGE CRITERIA:  Improved stabilization in mood, thinking, and/or behavior Motivation to continue treatment in a less acute level of care Need for constant or close observation no longer present Verbal commitment to aftercare and medication compliance Withdrawal symptoms are absent or subacute and managed without 24-hour nursing intervention  PRELIMINARY DISCHARGE PLAN: Attend aftercare/continuing care group Attend 12-step recovery group Outpatient therapy Return to previous living arrangement  PATIENT/FAMILY INVOLVEMENT: This treatment plan has been presented to and reviewed with the patient, Tracy Phillips,.  The patient and family have been given the opportunity to ask questions and make suggestions.  Juliann Pares, RN 04/07/2023, 12:06 AM

## 2023-04-07 NOTE — [facility] Group Notes (Signed)
Spirituality group facilitated by Chaplain Katy Adien Kimmel, B[facility].   Group Description: Group focused on topic of hope. Patients participated in facilitated discussion around topic, connecting with one another around experiences and definitions for hope. Group members engaged with visual explorer photos, reflecting on what hope looks like for them today. Group engaged in discussion around how their definitions of hope are present today in hospital.   Modalities: Psycho-social ed, Adlerian, Narrative, MI   Patient Progress: Did not attend.  

## 2023-04-07 NOTE — H&P (Signed)
Psychiatric Admission Assessment Adult  Patient Identification: Tracy Phillips MRN:  [id number] Date of Evaluation:  04/07/2023 Chief Complaint:  MDD (major depressive disorder), recurrent episode, severe (HCC) [F33.2] Principal Diagnosis: MDD (major depressive disorder), recurrent severe, without psychosis (HCC) Diagnosis:  Principal Problem:   MDD (major depressive disorder), recurrent severe, without psychosis (HCC) Active Problems:   Alcohol use disorder   GAD (generalized anxiety disorder)   GERD (gastroesophageal reflux disease)  CC:"I had alcohol intoxication and my depression worsened."  Reason for admission: Nakhiya Glazner is a a 57 yo CF with prior mental health diagnoses of alcohol use d/o, MDD & GAD who presented to the [facility] hospital ER on 5/17 with complaints of worsening depressive symptoms and +SI in the context of alcohol intoxication and psychosocial stressors.  Patient was medically cleared, and transferred voluntarily to this behavioral health Hospital for treatment and stabilization of her mental status.  Mode of transport to Hospital: Safe transport Current Outpatient (Home) Medication List: Effexor, but not taking.  States that she stopped taking this medication a few weeks ago as it stopped being effective. PRN medication prior to evaluation: Hydroxyzine, Milk of Magnesia, Maalox, Tylenol.  ED course: Uneventful Collateral Information: None at this time POA/Legal Guardian: Patient is her own legal guardian  History of present illness: Patient reports that her father died a few months ago, and she is still grieving.  She reports having other stressors including relationship and financial stressors, but is evasive about the nature of these stressors, and states that she prefers to not to go into details about them.  Patient reports that she began drinking alcohol again after 13 years of sobriety in order to ease the pain that she was feeling related to the  stressors listed above.  Patient reports that for the past couple of weeks, she has been drinking at least 3 L of wine daily, as a means of self-medicating herself of the depression that she is feeling, and also the anxiety that she feels.  Patient reports that while using the alcohol, she has become increasingly depressed.  Patient reports worsening insomnia, decreased interest in things that she once enjoyed, feelings of guilt about "not being a good daughter", decreased energy levels, trouble concentrating, poor appetite levels, psychomotor retardation, as well as feelings of hopelessness, helplessness, and worthlessness.  Patient also reports feeling frustrated and irritable, and states that her anxiety has worsened over the past couple of weeks and she has begun having panic attacks.  She states that the last panic symptom was this morning.  Patient denies any history of mania in the past, denies any history of psychosis in the past or most recently, denies any history of sexual, emotional, or physical abuse in her childhood or most recently.  She denies OCD type symptoms, denies any intrusive thoughts, denies self-injurious behaviors, denies any head injuries in the past or most recently.  Patient denies being in any legal problems.  Past Psychiatric Hx: Previous Psych Diagnoses: GAD, MDD, alcohol use disorder Prior inpatient treatment: Denies Current/prior outpatient treatment: Dr. Dale , who is her PCP is currently prescribing her mental health medications.  Prior rehab hx: Denies Psychotherapy hx: Denies History of suicide attempts: Denies History of homicide or aggression: Denies  Psychiatric medication history: Reported history of Celexa many years ago approximately 6 to 7 years ago, stopped using it because it was no longer helpful.  Reports trials of Effexor most recently, states it was helpful at the lower dose of 75 mg,  but stopped being effective when she stopped it.  Denies  any other medication trials.  Psychiatric medication compliance history: Reports compliance Neuromodulation history: Denies Current Psychiatrist: None Current therapist: None  Substance Abuse Hx: Alcohol: Started using alcohol in her 13s, currently uses 3 L of wine daily, states that she has required alcohol first thing in the morning due to withdrawal symptoms, denies any seizures related to alcohol, reports that she has had a couple of blackouts in the past. Tobacco: Denies Illicit drugs: Denies Rx drug abuse: Denies Rehab hx: Denies  Past Medical History: Medical Diagnoses: Denies Home Rx: Denies Prior Hosp: Denies Prior Surgeries/Trauma: Denies Head trauma, LOC, concussions, seizures: Denies Allergies: Denies LMP: Menopausal Contraception: None PCP: Dale [place]  Family History: Medical: Denies Psych: Denies Psych Rx: Denies SA/HA: Denies Substance use family hx: Denies  Social History: Patient reports that she lives alone, is retired, has 3 children including a set of twins.  She reports her support system as being her family.  Current presentation: Pt with flat affect and depressed mood, attention to personal hygiene and grooming is fair, eye contact is good, speech is clear & coherent. Thought contents are organized and logical, and pt currently denies SI/HI/AVH or paranoia. There is no evidence of delusional thoughts.  Patient does admit to having suicidal ideations prior to admission but not currently.  She has mild to moderate tremors, and is reporting being very anxious.  Associated Signs/Symptoms: Depression Symptoms:  depressed mood, anhedonia, insomnia, psychomotor retardation, feelings of worthlessness/guilt, difficulty concentrating, hopelessness, anxiety, panic attacks, loss of energy/fatigue, disturbed sleep, decreased appetite, (Hypo) Manic Symptoms:  Irritable Mood, Anxiety Symptoms:  Excessive Worry, Psychotic Symptoms:   none  PTSD  Symptoms: NA Total Time spent with patient: 1.5 hours  Is the patient at risk to self? Yes.    Has the patient been a risk to self in the past 6 months? Yes.    Has the patient been a risk to self within the distant past? No.  Is the patient a risk to others? No.  Has the patient been a risk to others in the past 6 months? No.  Has the patient been a risk to others within the distant past? No.   [place] Scale:  Flowsheet Row [facility]) [facility]osp-Admission (Discharged) from [facility]  C-SSRS RISK CATEGORY Moderate Risk Moderate Risk       Alcohol Screening: 1. How often do you have a drink containing alcohol?: 4 or more times a week 2. How many drinks containing alcohol do you have on a typical day when you are drinking?: 10 or more 3. How often do you have six or more drinks on one occasion?: Daily or almost daily AUDIT-C Score: 12 4. How often during the last year have you found that you were not able to stop drinking once you had started?: Daily or almost daily (for the last several months) 5. How often during the last year have you failed to do what was normally expected from you because of drinking?: Weekly 6. How often during the last year have you needed a first drink in the morning to get yourself going after a heavy drinking session?: Monthly 7. How often during the last year have you had a feeling of guilt of remorse after drinking?: Daily or almost daily 8. How often during the last year have you been unable to remember what happened  the night before because you had been drinking?: Less than monthly 9. Have you or someone else been injured as a result of your drinking?: No 10. Has a relative or friend or a doctor or another health worker been concerned about your drinking or suggested you cut down?: Yes, during the last year Alcohol Use Disorder  Identification Test Final Score (AUDIT): 30 Alcohol Brief Interventions/Follow-up: Alcohol education/Brief advice Substance Abuse History in the last 12 months:  Yes.   Consequences of Substance Abuse: Withdrawal Symptoms:   Nausea Previous Psychotropic Medications: Yes  Psychological Evaluations: No  Past Medical History:  Past Medical History:  Diagnosis Date   Depression    H/O dizziness    H/O eating disorder    H/O: alcohol abuse    Herpes    H/O   History of chicken pox    History of colon polyps    Hyperlipidemia    Hypertension    Kidney stones    H/O stones    Past Surgical History:  Procedure Laterality Date   ABDOMINAL HYSTERECTOMY  1998   APPENDECTOMY  1997   DILATION AND CURETTAGE OF UTERUS     TONSILLECTOMY  1997   Family History:  Family History  Problem Relation Age of Onset   Hyperlipidemia Mother    Hypertension Mother    Breast cancer Mother 61   Arthritis Father    Hyperlipidemia Father    Hypertension Father    Diabetes Father    Hypertension Brother    Arthritis Maternal Aunt    Hyperlipidemia Maternal Aunt    Hypertension Maternal Aunt    Alcohol abuse Maternal Uncle    Arthritis Maternal Uncle    Hyperlipidemia Maternal Uncle    Hypertension Maternal Uncle    Arthritis Paternal Aunt    Hyperlipidemia Paternal Aunt    Hypertension Paternal Aunt    Arthritis Paternal Uncle    Hyperlipidemia Paternal Uncle    Hypertension Paternal Uncle    Arthritis Maternal Grandmother    Hyperlipidemia Maternal Grandmother    Hypertension Maternal Grandmother    Arthritis Maternal Grandfather    Lung cancer Maternal Grandfather    Prostate cancer Maternal Grandfather    Hyperlipidemia Maternal Grandfather    Stroke Maternal Grandfather    Hypertension Maternal Grandfather    Arthritis Paternal Grandmother    Hyperlipidemia Paternal Grandmother    Hypertension Paternal Grandmother    Alcohol abuse Paternal Grandfather    Arthritis Paternal  Grandfather    Hyperlipidemia Paternal Grandfather    Hypertension Paternal Grandfather    Sudden death Paternal Grandfather    Family Psychiatric  History: Denies Tobacco Screening:  Social History   Tobacco Use  Smoking Status Never  Smokeless Tobacco Never    BH Tobacco Counseling     Are you interested in Tobacco Cessation Medications?  N/A, patient does not use tobacco products Counseled patient on smoking cessation:  N/A, patient does not use tobacco products Reason Tobacco Screening Not Completed: No value filed.       Social History:  Social History   Substance and Sexual Activity  Alcohol Use No   Comment: sober since 10/27/2010     Social History   Substance and Sexual Activity  Drug Use No    Allergies:   Allergies  Allergen Reactions   Aleve [Naproxen Sodium] Hives   Lab Results:  Results for orders placed or performed during the hospital encounter of 04/03/23 (from the past 48 hour(s))  Basic metabolic panel  Status: Abnormal   Collection Time: 04/06/23  4:25 AM  Result Value Ref Range   Sodium 139 135 - 145 mmol/L   Potassium 3.8 3.5 - 5.1 mmol/L   Chloride 106 98 - 111 mmol/L   CO2 26 22 - 32 mmol/L   Glucose, Bld 120 (H) 70 - 99 mg/dL    Comment: Glucose reference range applies only to samples taken after fasting for at least 8 hours.   BUN 14 6 - 20 mg/dL   Creatinine, Ser 1.61 0.44 - 1.00 mg/dL   Calcium 8.5 (L) 8.9 - 10.3 mg/dL   GFR, Estimated >09 >60 mL/min    Comment: (NOTE) Calculated using the CKD-EPI Creatinine Equation (2021)    Anion gap 7 5 - 15    Comment: Performed at [facility], [address] [place], [place] [address]  Magnesium     Status: None   Collection Time: 04/06/23  4:25 AM  Result Value Ref Range   Magnesium 2.0 1.7 - 2.4 mg/dL    Comment: Performed at [facility], [address] [place], [place] [address]    Blood Alcohol level:  Lab Results  Component Value Date   ETH [facility])  04/03/2023    Metabolic Disorder Labs:  Lab Results  Component Value Date   HGBA1C 5.9 05/13/2019   No results found for: "PROLACTIN" Lab Results  Component Value Date   CHOL 241 (H) 05/13/2019   TRIG 309.0 (H) 05/13/2019   HDL 45.10 05/13/2019   CHOLHDL 5 05/13/2019   VLDL 61.8 (H) 05/13/2019    Current Medications: Current Facility-Administered Medications  Medication Dose Route Frequency Provider Last Rate Last Admin   acetaminophen (TYLENOL) tablet 650 mg  650 mg Oral Q6H PRN Bennett, Christal H, NP       alum & mag hydroxide-simeth (MAALOX/MYLANTA) 200-200-20 MG/5ML suspension 30 mL  30 mL Oral Q4H PRN Bennett, Christal H, NP       chlordiazePOXIDE (LIBRIUM) capsule 25 mg  25 mg Oral Q6H PRN Padme Arriaga, NP       chlordiazePOXIDE (LIBRIUM) capsule 25 mg  25 mg Oral QID Starleen Blue, NP   25 mg at 04/07/23 1636   Followed by   Melene Muller ON 04/08/2023] chlordiazePOXIDE (LIBRIUM) capsule 25 mg  25 mg Oral TID Starleen Blue, NP       Followed by   Melene Muller ON 04/09/2023] chlordiazePOXIDE (LIBRIUM) capsule 25 mg  25 mg Oral BH-qamhs Hobson Lax, NP       Followed by   Melene Muller ON 04/10/2023] chlordiazePOXIDE (LIBRIUM) capsule 25 mg  25 mg Oral Daily Cornie Herrington, NP       diphenhydrAMINE (BENADRYL) capsule 50 mg  50 mg Oral TID PRN Bennett, Christal H, NP   50 mg at 04/07/23 0454   Or   diphenhydrAMINE (BENADRYL) injection 50 mg  50 mg Intramuscular TID PRN Bennett, Christal H, NP       DULoxetine (CYMBALTA) DR capsule 60 mg  60 mg Oral Daily Ruchama Kubicek, NP   60 mg at 04/07/23 1249   folic acid (FOLVITE) tablet 1 mg  1 mg Oral Daily Bennett, Christal H, NP   1 mg at 04/07/23 0802   haloperidol (HALDOL) tablet 5 mg  5 mg Oral TID PRN Willeen Cass, Christal H, NP   5 mg at 04/07/23 0455   Or   haloperidol lactate (HALDOL) injection 5 mg  5 mg Intramuscular TID PRN Hampton Abbot, NP  hydrOXYzine (ATARAX) tablet 50 mg  50 mg Oral TID PRN Willeen Cass, Christal H, NP   50 mg  at 04/07/23 1610   loperamide (IMODIUM) capsule 2-4 mg  2-4 mg Oral PRN Starleen Blue, NP       magnesium hydroxide (MILK OF MAGNESIA) suspension 30 mL  30 mL Oral Daily PRN Bennett, Christal H, NP       multivitamin with minerals tablet 1 tablet  1 tablet Oral Daily Bennett, Christal H, NP   1 tablet at 04/07/23 0803   ondansetron (ZOFRAN) tablet 4 mg  4 mg Oral Q8H PRN Bobbitt, Shalon E, NP   4 mg at 04/07/23 0435   pantoprazole (PROTONIX) EC tablet 40 mg  40 mg Oral Daily Trenna Kiely, Tyler Aas, NP       thiamine (Vitamin B-1) tablet 100 mg  100 mg Oral Daily Bennett, Christal H, NP   100 mg at 04/07/23 0802   traZODone (DESYREL) tablet 50 mg  50 mg Oral QHS PRN Willeen Cass, Christal H, NP   50 mg at 04/06/23 2146   traZODone (DESYREL) tablet 50 mg  50 mg Oral QHS Kissa Campoy, NP       PTA Medications: Medications Prior to Admission  Medication Sig Dispense Refill Last Dose   acyclovir (ZOVIRAX) 400 MG tablet TAKE 1 TABLET BY MOUTH TWICE A DAY 180 tablet 1    chlordiazePOXIDE (LIBRIUM) 25 MG capsule Take 1 capsule (25 mg total) by mouth 3 (three) times daily for 3 days. 9 capsule 0    folic acid (FOLVITE) 1 MG tablet Take 1 tablet (1 mg total) by mouth daily.      LORazepam (ATIVAN) 1 MG tablet Take 1-4 tablets (1-4 mg total) by mouth every hour as needed (withdrawal symptoms:  anxiety, agitation, insomnia, diaphoresis, nausea, vomiting, tremors, tachycardia, or hypertension.). 20 tablet 0    thiamine (VITAMIN B-1) 100 MG tablet Take 1 tablet (100 mg total) by mouth daily.      traZODone (DESYREL) 50 MG tablet TAKE 1-2 TABLETS EVERY NIGHT 180 tablet 1    venlafaxine XR (EFFEXOR-XR) 75 MG 24 hr capsule TAKE 1 CAPSULE BY MOUTH DAILY WITH BREAKFAST. 90 capsule 1    Musculoskeletal: Strength & Muscle Tone: within normal limits Gait & Station: normal Patient leans: N/A  Psychiatric Specialty Exam:  Presentation  General Appearance: Appropriate for Environment; Fairly Groomed  Eye  Contact:Good  Speech:Clear and Coherent  Speech Volume:Normal  Handedness:Right   Mood and Affect  Mood:Anxious; Depressed  Affect:Congruent   Thought Process  Thought Processes:Coherent  Duration of Psychotic Symptoms:N/A Past Diagnosis of Schizophrenia or Psychoactive disorder: No  Descriptions of Associations:Intact  Orientation:Full (Time, Place and Person)  Thought Content:Logical  Hallucinations:Hallucinations: None  Ideas of Reference:None  Suicidal Thoughts:Suicidal Thoughts: No  Homicidal Thoughts:Homicidal Thoughts: No   Sensorium  Memory:Immediate Good  Judgment:Fair  Insight:Fair   Executive Functions  Concentration:Fair  Attention Span:Fair  Recall:Fair  Fund of Knowledge:Fair  Language:Fair   Psychomotor Activity  Psychomotor Activity:Psychomotor Activity: Normal   Assets  Assets:Communication Skills; Social Support; Resilience   Sleep  Sleep:Sleep: Poor    Physical Exam: Physical Exam Constitutional:      Appearance: Normal appearance.  HENT:     Head: Normocephalic.     Nose: Nose normal.  Eyes:     Pupils: Pupils are equal, round, and reactive to light.  Musculoskeletal:        General: Normal range of motion.     Cervical back: Normal range of motion.  Neurological:     Mental Status: She is alert and oriented to person, place, and time.    Review of Systems  Constitutional:  Negative for fever.  HENT:  Negative for hearing loss.   Eyes:  Negative for blurred vision.  Respiratory:  Negative for cough.   Cardiovascular:  Negative for chest pain.  Gastrointestinal:  Negative for heartburn.  Genitourinary:  Negative for dysuria.  Musculoskeletal:  Negative for myalgias.  Skin:  Negative for rash.  Neurological:  Negative for dizziness.  Psychiatric/Behavioral:  Positive for depression and substance abuse. Negative for hallucinations, memory loss and suicidal ideas. The patient is nervous/anxious and has  insomnia.    Blood pressure (!) 106/56, pulse 74, temperature 98.7 F (37.1 C), temperature source Oral, resp. rate 18, height 5\' 4"  (1.626 m), weight 65.8 kg, SpO2 99 %. Body mass index is 24.89 kg/m.  Treatment Plan Summary: Daily contact with patient to assess and evaluate symptoms and progress in treatment and Medication management  Observation Level/Precautions:  15 minute checks  Laboratory:  Labs reviewed on 5/20: Orders placed for lipid panel, hemoglobin A1c, TSH, and vitamin D levels.  Psychotherapy:  Unit Group sessions  Medications:  See [facility]  Consultations:  To be determined   Discharge Concerns:  Safety, medication compliance, mood stability  Estimated LOS: 5-7 days  Other:  N/A   PLAN Safety and Monitoring: Voluntary admission to inpatient psychiatric unit for safety, stabilization and treatment Daily contact with patient to assess and evaluate symptoms and progress in treatment Patient's case to be discussed in multi-disciplinary team meeting Observation Level : q15 minute checks Vital signs: q12 hours Precautions: Safety  Long Term Goal(s): Improvement in symptoms so as ready for discharge  Short Term Goals: Ability to identify changes in lifestyle to reduce recurrence of condition will improve, Ability to verbalize feelings will improve, Ability to disclose and discuss suicidal ideas, Ability to demonstrate self-control will improve, Ability to identify and develop effective coping behaviors will improve, Ability to maintain clinical measurements within normal limits will improve, Compliance with prescribed medications will improve, and Ability to identify triggers associated with substance abuse/mental health issues will improve  Diagnoses:  Principal Problem:   MDD (major depressive disorder), recurrent severe, without psychosis (HCC) Active Problems:   Alcohol use disorder   GAD (generalized anxiety disorder)   GERD (gastroesophageal reflux  disease)  Medications -Start Cymbalta 60 mg daily for depressive symptoms -Start Librium taper for alcohol use disorder -Start trazodone 50 mg nightly for sleep, and start trazodone 50 mg as needed nightly for sleep -Start hydroxyzine 50 mg 3 times daily as needed for anxiety -Start Protonix 40 mg daily for GERD  Patient has been educated on rationales, benefits, and possible side effects of all medications as listed above.  She is agreeable to taking medications.  Other PRNS -Continue Tylenol 650 mg every 6 hours PRN for mild pain -Continue Maalox 30 mg every 4 hrs PRN for indigestion -Continue Imodium 2-4 mg as needed for diarrhea -Continue Milk of Magnesia as needed every 6 hrs for constipation -Continue Zofran disintegrating tabs every 6 hrs PRN for nausea   Discharge Planning: Social work and case management to assist with discharge planning and identification of hospital follow-up needs prior to discharge Estimated LOS: 5-7 days Discharge Concerns: Need to establish a safety plan; Medication compliance and effectiveness Discharge Goals: Return home with outpatient referrals for mental health follow-up including medication management/psychotherapy  I certify that inpatient services furnished can reasonably be expected  to improve the patient's condition.    Starleen Blue, NP 5/20/20244:38 PM

## 2023-04-07 NOTE — Progress Notes (Signed)
   04/07/23 0800  Psych Admission Type (Psych Patients Only)  Admission Status Voluntary  Psychosocial Assessment  Patient Complaints Anxiety;Substance abuse;Irritability  Eye Contact Fair  Facial Expression Anxious  Affect Appropriate to circumstance  Speech Logical/coherent  Interaction Guarded  Motor Activity Slow  Appearance/Hygiene Unremarkable  Behavior Characteristics Appropriate to situation  Mood Depressed;Anxious  Thought Process  Coherency WDL  Content WDL  Delusions None reported or observed  Perception WDL  Hallucination None reported or observed  Judgment Poor  Confusion None  Danger to Self  Current suicidal ideation? Passive  Self-Injurious Behavior No self-injurious ideation or behavior indicators observed or expressed   Agreement Not to Harm Self Yes  Description of Agreement verbal  Danger to Others  Danger to Others None reported or observed

## 2023-04-07 NOTE — BH IP Treatment Plan (Signed)
Interdisciplinary Treatment and Diagnostic Plan Update  04/07/2023 Time of Session: 10:45am Tracy Phillips MRN: [id number]  Principal Diagnosis: MDD (major depressive disorder), recurrent episode, severe (HCC)  Secondary Diagnoses: Principal Problem:   MDD (major depressive disorder), recurrent episode, severe (HCC)   Current Medications:  Current Facility-Administered Medications  Medication Dose Route Frequency Provider Last Rate Last Admin   acetaminophen (TYLENOL) tablet 650 mg  650 mg Oral Q6H PRN Bennett, Christal H, NP       alum & mag hydroxide-simeth (MAALOX/MYLANTA) 200-200-20 MG/5ML suspension 30 mL  30 mL Oral Q4H PRN Bennett, Christal H, NP       chlordiazePOXIDE (LIBRIUM) capsule 25 mg  25 mg Oral Q6H PRN Starleen Blue, NP       chlordiazePOXIDE (LIBRIUM) capsule 25 mg  25 mg Oral QID Starleen Blue, NP   25 mg at 04/07/23 1249   Followed by   Melene Muller ON 04/08/2023] chlordiazePOXIDE (LIBRIUM) capsule 25 mg  25 mg Oral TID Starleen Blue, NP       Followed by   Melene Muller ON 04/09/2023] chlordiazePOXIDE (LIBRIUM) capsule 25 mg  25 mg Oral BH-qamhs Nkwenti, Doris, NP       Followed by   Melene Muller ON 04/10/2023] chlordiazePOXIDE (LIBRIUM) capsule 25 mg  25 mg Oral Daily Nkwenti, Doris, NP       diphenhydrAMINE (BENADRYL) capsule 50 mg  50 mg Oral TID PRN Willeen Cass, Christal H, NP   50 mg at 04/07/23 0454   Or   diphenhydrAMINE (BENADRYL) injection 50 mg  50 mg Intramuscular TID PRN Bennett, Christal H, NP       DULoxetine (CYMBALTA) DR capsule 60 mg  60 mg Oral Daily Starleen Blue, NP   60 mg at 04/07/23 1249   folic acid (FOLVITE) tablet 1 mg  1 mg Oral Daily Bennett, Christal H, NP   1 mg at 04/07/23 0802   haloperidol (HALDOL) tablet 5 mg  5 mg Oral TID PRN Thurston Hole H, NP   5 mg at 04/07/23 0455   Or   haloperidol lactate (HALDOL) injection 5 mg  5 mg Intramuscular TID PRN Bennett, Christal H, NP       hydrOXYzine (ATARAX) tablet 50 mg  50 mg Oral TID PRN Willeen Cass,  Christal H, NP   50 mg at 04/07/23 1191   loperamide (IMODIUM) capsule 2-4 mg  2-4 mg Oral PRN Starleen Blue, NP       magnesium hydroxide (MILK OF MAGNESIA) suspension 30 mL  30 mL Oral Daily PRN Bennett, Christal H, NP       multivitamin with minerals tablet 1 tablet  1 tablet Oral Daily Bennett, Christal H, NP   1 tablet at 04/07/23 0803   ondansetron (ZOFRAN) tablet 4 mg  4 mg Oral Q8H PRN Bobbitt, Shalon E, NP   4 mg at 04/07/23 0435   thiamine (Vitamin B-1) tablet 100 mg  100 mg Oral Daily Bennett, Christal H, NP   100 mg at 04/07/23 0802   traZODone (DESYREL) tablet 50 mg  50 mg Oral QHS PRN Willeen Cass, Christal H, NP   50 mg at 04/06/23 2146   traZODone (DESYREL) tablet 50 mg  50 mg Oral QHS Nkwenti, Doris, NP       PTA Medications: Medications Prior to Admission  Medication Sig Dispense Refill Last Dose   acyclovir (ZOVIRAX) 400 MG tablet TAKE 1 TABLET BY MOUTH TWICE A DAY 180 tablet 1    chlordiazePOXIDE (LIBRIUM) 25 MG capsule Take 1 capsule (25  mg total) by mouth 3 (three) times daily for 3 days. 9 capsule 0    folic acid (FOLVITE) 1 MG tablet Take 1 tablet (1 mg total) by mouth daily.      LORazepam (ATIVAN) 1 MG tablet Take 1-4 tablets (1-4 mg total) by mouth every hour as needed (withdrawal symptoms:  anxiety, agitation, insomnia, diaphoresis, nausea, vomiting, tremors, tachycardia, or hypertension.). 20 tablet 0    thiamine (VITAMIN B-1) 100 MG tablet Take 1 tablet (100 mg total) by mouth daily.      traZODone (DESYREL) 50 MG tablet TAKE 1-2 TABLETS EVERY NIGHT 180 tablet 1    venlafaxine XR (EFFEXOR-XR) 75 MG 24 hr capsule TAKE 1 CAPSULE BY MOUTH DAILY WITH BREAKFAST. 90 capsule 1     Patient Stressors: Loss of father   Substance abuse   Traumatic event    Patient Strengths: Average or above average intelligence  Capable of independent living  Communication skills  Supportive family/friends   Treatment Modalities: Medication Management, Group therapy, Case management,  1  to 1 session with clinician, Psychoeducation, Recreational therapy.   Physician Treatment Plan for Primary Diagnosis: MDD (major depressive disorder), recurrent episode, severe (HCC) Long Term Goal(s):     Short Term Goals:    Medication Management: Evaluate patient's response, side effects, and tolerance of medication regimen.  Therapeutic Interventions: 1 to 1 sessions, Unit Group sessions and Medication administration.  Evaluation of Outcomes: Progressing  Physician Treatment Plan for Secondary Diagnosis: Principal Problem:   MDD (major depressive disorder), recurrent episode, severe (HCC)  Long Term Goal(s):     Short Term Goals:       Medication Management: Evaluate patient's response, side effects, and tolerance of medication regimen.  Therapeutic Interventions: 1 to 1 sessions, Unit Group sessions and Medication administration.  Evaluation of Outcomes: Progressing   RN Treatment Plan for Primary Diagnosis: MDD (major depressive disorder), recurrent episode, severe (HCC) Long Term Goal(s): Knowledge of disease and therapeutic regimen to maintain health will improve  Short Term Goals: Ability to remain free from injury will improve, Ability to verbalize frustration and anger appropriately will improve, Ability to demonstrate self-control, Ability to participate in decision making will improve, Ability to verbalize feelings will improve, Ability to disclose and discuss suicidal ideas, Ability to identify and develop effective coping behaviors will improve, and Compliance with prescribed medications will improve  Medication Management: RN will administer medications as ordered by provider, will assess and evaluate patient's response and provide education to patient for prescribed medication. RN will report any adverse and/or side effects to prescribing provider.  Therapeutic Interventions: 1 on 1 counseling sessions, Psychoeducation, Medication administration, Evaluate responses  to treatment, Monitor vital signs and CBGs as ordered, Perform/monitor CIWA, COWS, AIMS and Fall Risk screenings as ordered, Perform wound care treatments as ordered.  Evaluation of Outcomes: Progressing   LCSW Treatment Plan for Primary Diagnosis: MDD (major depressive disorder), recurrent episode, severe (HCC) Long Term Goal(s): Safe transition to appropriate next level of care at discharge, Engage patient in therapeutic group addressing interpersonal concerns.  Short Term Goals: Engage patient in aftercare planning with referrals and resources, Increase social support, Increase ability to appropriately verbalize feelings, Increase emotional regulation, Facilitate acceptance of mental health diagnosis and concerns, Facilitate patient progression through stages of change regarding substance use diagnoses and concerns, Identify triggers associated with mental health/substance abuse issues, and Increase skills for wellness and recovery  Therapeutic Interventions: Assess for all discharge needs, 1 to 1 time with Social worker, Explore available  resources and support systems, Assess for adequacy in community support network, Educate family and significant other(s) on suicide prevention, Complete Psychosocial Assessment, Interpersonal group therapy.  Evaluation of Outcomes: Progressing   Progress in Treatment: Attending groups: Yes. Participating in groups: Yes. Taking medication as prescribed: Yes. Toleration medication: Yes. Family/Significant other contact made: No, will contact:  will identify  Patient understands diagnosis: Yes. Discussing patient identified problems/goals with staff: Yes. Medical problems stabilized or resolved: Yes. Denies suicidal/homicidal ideation: Yes. Issues/concerns per patient self-inventory: No.  New problem(s) identified: No, Describe:  none reported   New Short Term/Long Term Goal(s):  medication stabilization, elimination of SI thoughts, development of  comprehensive mental wellness plan.    Patient Goals:  Pt states, "I want to improve my mental states"  Discharge Plan or Barriers:  Patient recently admitted. CSW will continue to follow and assess for appropriate referrals and possible discharge planning.    Reason for Continuation of Hospitalization: Anxiety Depression Medication stabilization Suicidal ideation Withdrawal symptoms  Estimated Length of Stay: 3-5 days  Last 3 [place] Suicide Severity Risk Score: Flowsheet Row [facility] (Current[facility]Admission (Discharged) from 04/03/2023 in [facility]  C-SSRS RISK CATEGORY Moderate Risk Moderate Risk       Last PHQ 2/9 Scores:    12/24/2019    3:38 PM 11/05/2019    1:36 PM 04/26/2019    9:31 AM  Depression screen PHQ 2/9  Decreased Interest 2  2  Down, Depressed, Hopeless 2  2  PHQ - 2 Score 4  4  Altered sleeping 2  2  Tired, decreased energy 2  1  Change in appetite 2  0  Feeling bad or failure about yourself  2  1  Trouble concentrating 0  0  Moving slowly or fidgety/restless 0 0 0  Suicidal thoughts 0 0 0  PHQ-9 Score 12  8  Difficult doing work/chores Somewhat difficult  Not difficult at all    Scribe for Treatment Team: Beatris Si, LCSW 04/07/2023 2:42 PM

## 2023-04-07 NOTE — BHH Counselor (Signed)
CSW attempted twice to complete patient assessment today and both times patient declined stating " can you come back later ".
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
## 2023-04-08 LAB — VITAMIN D 25 HYDROXY (VIT D DEFICIENCY, FRACTURES): Vit D, 25-Hydroxy: 46.37 ng/mL (ref 30–100)

## 2023-04-08 LAB — TSH: TSH: 2.288 u[IU]/mL (ref 0.350–4.500)

## 2023-04-08 LAB — LIPID PANEL
Cholesterol: 226 mg/dL — ABNORMAL HIGH (ref 0–200)
HDL: 59 mg/dL (ref >40)
LDL Cholesterol: 137 mg/dL — ABNORMAL HIGH (ref 0–99)
Total CHOL/HDL Ratio: 3.8 RATIO
Triglycerides: 149 mg/dL (ref <150)
VLDL: 30 mg/dL (ref 0–40)

## 2023-04-08 LAB — HEMOGLOBIN A1C
Hgb A1c MFr Bld: 5.7 % — ABNORMAL HIGH (ref 4.8–5.6)
Mean Plasma Glucose: 116.89 mg/dL

## 2023-04-08 MED ORDER — TRAZODONE HCL 100 MG PO TABS
100.0000 mg | ORAL_TABLET | Freq: Every day | ORAL | Status: DC
  Administered 2023-04-08 – 2023-04-12 (×5): 100 mg via ORAL
  Filled 2023-04-08 (×8): qty 1

## 2023-04-08 MED ORDER — HYDROXYZINE HCL 50 MG PO TABS
50.0000 mg | ORAL_TABLET | Freq: Four times a day (QID) | ORAL | Status: DC | PRN
  Administered 2023-04-08: 50 mg via ORAL
  Filled 2023-04-08: qty 1

## 2023-04-08 MED ORDER — GABAPENTIN 100 MG PO CAPS
200.0000 mg | ORAL_CAPSULE | Freq: Three times a day (TID) | ORAL | Status: DC
  Administered 2023-04-08 – 2023-04-09 (×4): 200 mg via ORAL
  Filled 2023-04-08 (×9): qty 2

## 2023-04-08 MED ORDER — HYDROXYZINE HCL 50 MG PO TABS
50.0000 mg | ORAL_TABLET | Freq: Three times a day (TID) | ORAL | Status: DC | PRN
  Administered 2023-04-08 – 2023-04-13 (×10): 50 mg via ORAL
  Filled 2023-04-08 (×10): qty 1

## 2023-04-08 NOTE — Group Note (Signed)
Date:  04/08/2023 Time:  5:01 PM  Group Topic/Focus:  Goals Group:   The focus of this group is to help patients establish daily goals to achieve during treatment and discuss how the patient can incorporate goal setting into their daily lives to aide in recovery. Orientation:   The focus of this group is to educate the patient on the purpose and policies of crisis stabilization and provide a format to answer questions about their admission.  The group details unit policies and expectations of patients while admitted.    Participation Level:  Did Not Attend  Participation Quality:   n/a  Affect:   n/a  Cognitive:   n/a  Insight: None  Engagement in Group:   n/a  Modes of Intervention:   n/a  Additional Comments:   Pt did not attend.  [name] [name] 04/08/2023, 5:01 PM

## 2023-04-08 NOTE — Group Note (Signed)
Recreation Therapy Group Note   Group Topic:Animal Assisted Therapy   Group Date: 04/08/2023 Start Time: 0945 End Time: 1030 Facilitators: Mistee Soliman, Benito Mccreedy, LRT  Animal-Assisted Activity (AAA) Program Checklist/Progress Note Patient Eligibility Criteria Checklist & Daily Group note for Rec Tx Intervention   AAA/T Program Assumption of Risk Form signed by Patient/ or Parent Legal Guardian YES  Patient is free of allergies or severe asthma  YES  Patient reports no fear of animals YES  Patient reports no history of cruelty to animals YES  Patient understands their participation is voluntary YES   Group Description: Patients provided opportunity to interact with trained and credentialed [employer] Therapy dog and the community volunteer/dog handler.   Affect/Mood: N/A   Participation Level: Did not attend    Clinical Observations/Individualized Feedback: Pt declined invitation to AAA programming offered in dayroom.   Benito Mccreedy Lissa Rowles, LRT, CTRS 04/08/2023 3:35 PM

## 2023-04-08 NOTE — [facility] Group Notes (Signed)
[facility] Group Notes:  (Nursing/MHT/Case Management/Adjunct)  Date:  04/08/2023  Time:  4:38 AM  Type of Therapy:   AA group  Participation Level:  None  Participation Quality:  Drowsy  Affect:  Flat  Cognitive:  Lacking  Insight:  Lacking  Engagement in Group:  Lacking  Modes of Intervention:  Education  Summary of Progress/Problems: Pt attended for 15 minutes then left.   Noah Delaine 04/08/2023, 4:38 AM

## 2023-04-08 NOTE — Group Note (Signed)
LCSW Group Therapy Note  Group Date: 04/08/2023 Start Time: 1100 End Time: 1200   Type of Therapy and Topic:  Group Therapy - How To Cope with Nervousness about Discharge   Participation Level:  Active   Description of Group This process group involved identification of patients' feelings about discharge. Some of them are scheduled to be discharged soon, while others are new admissions, but each of them was asked to share thoughts and feelings surrounding discharge from the hospital. One common theme was that they are excited at the prospect of going home, while another was that many of them are apprehensive about sharing why they were hospitalized. Patients were given the opportunity to discuss these feelings with their peers in preparation for discharge.  Therapeutic Goals  Patient will identify their overall feelings about pending discharge. Patient will think about how they might proactively address issues that they believe will once again arise once they get home (i.e. with parents). Patients will participate in discussion about having hope for change.   Summary of Patient Progress:  She was very active throughout the session. She demonstrated good insight into the subject matter, and proved open to input from peers and feedback from CSW. She was respectful of peers and participated throughout the entire session.   Therapeutic Modalities Cognitive Behavioral Therapy   Marinda Elk, Theresia Majors 04/08/2023  12:07 PM

## 2023-04-08 NOTE — Progress Notes (Signed)
[facility] MD Progress Note  04/08/2023 3:41 PM [name]  MRN:  [id number] Principal Problem: MDD (major depressive disorder), recurrent severe, without psychosis (HCC) Diagnosis: Principal Problem:   MDD (major depressive disorder), recurrent severe, without psychosis (HCC) Active Problems:   Alcohol use disorder   GAD (generalized anxiety disorder)   GERD (gastroesophageal reflux disease)  Reason for admission: Tracy Phillips is a a 57 yo CF with prior mental health diagnoses of alcohol use d/o, MDD & GAD who presented to the [facility] hospital ER on 5/17 with complaints of worsening depressive symptoms and +SI in the context of alcohol intoxication and psychosocial stressors.  Patient was medically cleared, and transferred voluntarily to this behavioral health Hospital for treatment and stabilization of her mental status.   24 Hr chart Review: Sleep Hours last night: Not documented, but reports a poor sleep quality last night Nursing Concerns: Nursing staff reported med seeking behaviors and somatic symptoms overnight and earlier today morning Behavioral episodes in the past 24 hrs: As above Medication Compliance: Compliant Vital Signs in the past 24 hrs: WNL PRN Medications in the past 24 hrs: Hydroxyzine, agitation protocol medications earlier today morning, received Benadryl Haldol.  Assessment note: On assessment today, patient presents with a depressed mood, she reports being anxious, but is talking calmly to Clinical research associate.  Patient is over presenting has symptoms, seems to be wanting secondary gain from more medications in certain categories being prescribed to her.  She has persistently asked nursing staff for Ativan, and also persistently asked for Phenergan from Clinical research associate and her assigned RN.  Patient stated "Phernergan contains a little something that Zofran does not contain, and it makes be calm and sleepy. I want to go to sleep. I have not been able to get good sleep."  Patient has been  educated that this medication will not be prescribed for her during this hospitalization, but we will keep Zofran for her nausea.  Patient wanting for Zofran to be scheduled, and writer agreeable to this.  Patient reports a poor sleep quality last night, states appetite remains poor due to nausea, reports that she has headaches related to withdrawals from alcohol, and denies other body pain. Her attention to personal hygiene and grooming is fair, eye contact is good, speech is clear & coherent. Thought contents are organized and logical, and pt currently denies SI/HI/AVH or paranoia. There is no evidence of delusional thoughts.    We are increasing trazodone nightly to 100 mg for sleep, Will start Gabapentin 200 mg TID for alcohol use d/o, and change Hydroxyzine back to TID PRN.   Total Time spent with patient: 45 minutes  Past Psychiatric History: See H & P  Past Medical History:  Past Medical History:  Diagnosis Date   Depression    H/O dizziness    H/O eating disorder    H/O: alcohol abuse    Herpes    H/O   History of chicken pox    History of colon polyps    Hyperlipidemia    Hypertension    Kidney stones    H/O stones    Past Surgical History:  Procedure Laterality Date   ABDOMINAL HYSTERECTOMY  1998   APPENDECTOMY  1997   DILATION AND CURETTAGE OF UTERUS     TONSILLECTOMY  1997   Family History:  Family History  Problem Relation Age of Onset   Hyperlipidemia Mother    Hypertension Mother    Breast cancer Mother 18   Arthritis Father  Hyperlipidemia Father    Hypertension Father    Diabetes Father    Hypertension Brother    Arthritis Maternal Aunt    Hyperlipidemia Maternal Aunt    Hypertension Maternal Aunt    Alcohol abuse Maternal Uncle    Arthritis Maternal Uncle    Hyperlipidemia Maternal Uncle    Hypertension Maternal Uncle    Arthritis Paternal Aunt    Hyperlipidemia Paternal Aunt    Hypertension Paternal Aunt    Arthritis Paternal Uncle     Hyperlipidemia Paternal Uncle    Hypertension Paternal Uncle    Arthritis Maternal Grandmother    Hyperlipidemia Maternal Grandmother    Hypertension Maternal Grandmother    Arthritis Maternal Grandfather    Lung cancer Maternal Grandfather    Prostate cancer Maternal Grandfather    Hyperlipidemia Maternal Grandfather    Stroke Maternal Grandfather    Hypertension Maternal Grandfather    Arthritis Paternal Grandmother    Hyperlipidemia Paternal Grandmother    Hypertension Paternal Grandmother    Alcohol abuse Paternal Grandfather    Arthritis Paternal Grandfather    Hyperlipidemia Paternal Grandfather    Hypertension Paternal Grandfather    Sudden death Paternal Grandfather    Family Psychiatric  History: See H & P Social History:  Social History   Substance and Sexual Activity  Alcohol Use No   Comment: sober since 10/27/2010     Social History   Substance and Sexual Activity  Drug Use No    Social History   Socioeconomic History   Marital status: Single    Spouse name: Not on file   Number of children: Not on file   Years of education: Not on file   Highest education level: Not on file  Occupational History   Not on file  Tobacco Use   Smoking status: Never   Smokeless tobacco: Never  Vaping Use   Vaping Use: Never used  Substance and Sexual Activity   Alcohol use: No    Comment: sober since 10/27/2010   Drug use: No   Sexual activity: Not on file  Other Topics Concern   Not on file  Social History Narrative   Not on file   Social Determinants of Health   Financial Resource Strain: Not on file  Food Insecurity: No Food Insecurity (04/06/2023)   Hunger Vital Sign    Worried About Running Out of Food in the Last Year: Never true    Ran Out of Food in the Last Year: Never true  Transportation Needs: No Transportation Needs (04/06/2023)   PRAPARE - Administrator, Civil Service (Medical): No    Lack of Transportation (Non-Medical): No   Physical Activity: Not on file  Stress: Not on file  Social Connections: Not on file   Sleep: Poor  Appetite:  Poor  Current Medications: Current Facility-Administered Medications  Medication Dose Route Frequency Provider Last Rate Last Admin   acetaminophen (TYLENOL) tablet 650 mg  650 mg Oral Q6H PRN Bennett, Christal H, NP   650 mg at 04/08/23 0756   alum & mag hydroxide-simeth (MAALOX/MYLANTA) 200-200-20 MG/5ML suspension 30 mL  30 mL Oral Q4H PRN Bennett, Christal H, NP       chlordiazePOXIDE (LIBRIUM) capsule 25 mg  25 mg Oral Q6H PRN Caedan Sumler, NP       chlordiazePOXIDE (LIBRIUM) capsule 25 mg  25 mg Oral TID Starleen Blue, NP   25 mg at 04/08/23 1152   Followed by   Melene Muller ON 04/09/2023] chlordiazePOXIDE (  LIBRIUM) capsule 25 mg  25 mg Oral BH-qamhs Keylan Costabile, NP       Followed by   Melene Muller ON 04/10/2023] chlordiazePOXIDE (LIBRIUM) capsule 25 mg  25 mg Oral Daily Josephene Marrone, NP       diphenhydrAMINE (BENADRYL) capsule 50 mg  50 mg Oral TID PRN Willeen Cass, Christal H, NP   50 mg at 04/07/23 0454   Or   diphenhydrAMINE (BENADRYL) injection 50 mg  50 mg Intramuscular TID PRN Bennett, Christal H, NP       DULoxetine (CYMBALTA) DR capsule 60 mg  60 mg Oral Daily Starleen Blue, NP   60 mg at 04/08/23 0754   folic acid (FOLVITE) tablet 1 mg  1 mg Oral Daily Bennett, Christal H, NP   1 mg at 04/08/23 0754   gabapentin (NEURONTIN) capsule 200 mg  200 mg Oral TID Starleen Blue, NP       haloperidol (HALDOL) tablet 5 mg  5 mg Oral TID PRN Willeen Cass, Christal H, NP   5 mg at 04/07/23 0455   Or   haloperidol lactate (HALDOL) injection 5 mg  5 mg Intramuscular TID PRN Bennett, Christal H, NP       hydrOXYzine (ATARAX) tablet 50 mg  50 mg Oral TID PRN Starleen Blue, NP       loperamide (IMODIUM) capsule 2-4 mg  2-4 mg Oral PRN Carmella Kees, NP       magnesium hydroxide (MILK OF MAGNESIA) suspension 30 mL  30 mL Oral Daily PRN Bennett, Christal H, NP       multivitamin with  minerals tablet 1 tablet  1 tablet Oral Daily Bennett, Christal H, NP   1 tablet at 04/08/23 0753   ondansetron (ZOFRAN) tablet 4 mg  4 mg Oral Q8H PRN Bobbitt, Shalon E, NP   4 mg at 04/08/23 0726   pantoprazole (PROTONIX) EC tablet 40 mg  40 mg Oral Daily Starleen Blue, NP   40 mg at 04/08/23 0753   thiamine (Vitamin B-1) tablet 100 mg  100 mg Oral Daily Bennett, Christal H, NP   100 mg at 04/08/23 0753   traZODone (DESYREL) tablet 100 mg  100 mg Oral QHS Agam Davenport, NP       traZODone (DESYREL) tablet 50 mg  50 mg Oral QHS PRN Willeen Cass, Christal H, NP   50 mg at 04/06/23 2146    Lab Results:  Results for orders placed or performed during the hospital encounter of 04/06/23 (from the past 48 hour(s))  Hemoglobin A1c     Status: Abnormal   Collection Time: 04/08/23  6:35 AM  Result Value Ref Range   Hgb A1c MFr Bld 5.7 (H) 4.8 - 5.6 %    Comment: (NOTE) Pre diabetes:          5.7%-6.4%  Diabetes:              >6.4%  Glycemic control for   <7.0% adults with diabetes    Mean Plasma Glucose 116.89 mg/dL    Comment: Performed at [facility], [address], [place], [place] [address]  Lipid panel     Status: Abnormal   Collection Time: 04/08/23  6:35 AM  Result Value Ref Range   Cholesterol 226 (H) 0 - 200 mg/dL   Triglycerides 191 <478 mg/dL   HDL 59 >29 mg/dL   Total CHOL/HDL Ratio 3.8 RATIO   VLDL 30 0 - 40 mg/dL   LDL Cholesterol 562 (H) 0 - 99 mg/dL  Comment:        Total Cholesterol/HDL:CHD Risk Coronary Heart Disease Risk Table                     Men   Women  1/2 Average Risk   3.4   3.3  Average Risk       5.0   4.4  2 X Average Risk   9.6   7.1  3 X Average Risk  23.4   11.0        Use the calculated Patient Ratio above and the CHD Risk Table to determine the patient's CHD Risk.        ATP III CLASSIFICATION (LDL):  <100     mg/dL   Optimal  161-096  mg/dL   Near or Above                    Optimal  130-159  mg/dL   Borderline  045-409  mg/dL    High  >811     mg/dL   Very High Performed at [facility], [address], [place], [place] [address]   TSH     Status: None   Collection Time: 04/08/23  6:35 AM  Result Value Ref Range   TSH 2.288 0.350 - 4.500 uIU/mL    Comment: Performed by a 3rd Generation assay with a functional sensitivity of <=0.01 uIU/mL. Performed at [facility], [address], [place], [place] [address]   VITAMIN D 25 Hydroxy (Vit-D Deficiency, Fractures)     Status: None   Collection Time: 04/08/23  6:35 AM  Result Value Ref Range   Vit D, 25-Hydroxy 46.37 30 - 100 ng/mL    Comment: (NOTE) Vitamin D deficiency has been defined by the Institute of Medicine  and an Endocrine Society practice guideline as a level of serum 25-OH  vitamin D less than 20 ng/mL (1,2). The Endocrine Society went on to  further define vitamin D insufficiency as a level between 21 and 29  ng/mL (2).  1. IOM (Institute of Medicine). 2010. Dietary reference intakes for  calcium and D. [place]: [employer]. 2. Holick MF, Binkley Ripley, Bischoff-Ferrari HA, et al. Evaluation,  treatment, and prevention of vitamin D deficiency: an Endocrine  Society clinical practice guideline, JCEM. 2011 Jul; 96(7): 1911-30.  Performed at [facility], [address], [place], [place] [address]     Blood Alcohol level:  Lab Results  Component Value Date   ETH 307 [facility]) 04/03/2023    Metabolic Disorder Labs: Lab Results  Component Value Date   HGBA1C 5.7 (H) 04/08/2023   MPG 116.89 04/08/2023   No results found for: "PROLACTIN" Lab Results  Component Value Date   CHOL 226 (H) 04/08/2023   TRIG 149 04/08/2023   HDL 59 04/08/2023   CHOLHDL 3.8 04/08/2023   VLDL 30 04/08/2023   LDLCALC 137 (H) 04/08/2023    Physical Findings: AIMS:  , ,  ,  ,    CIWA:  CIWA-Ar Total: 5 COWS:     Musculoskeletal: Strength & Muscle Tone: within normal limits Gait & Station:  normal Patient leans: N/A  Psychiatric Specialty Exam:  Presentation  General Appearance:  Appropriate for Environment; Fairly Groomed  Eye Contact: Fair  Speech: Clear and Coherent  Speech Volume: Normal  Handedness: Right   Mood and Affect  Mood: Anxious; Depressed  Affect: Congruent   Thought Process  Thought Processes: Coherent  Descriptions of Associations:Intact  Orientation:Full (Time, Place and Person)  Thought Content:Logical  History of Schizophrenia/Schizoaffective disorder:No  Duration of Psychotic Symptoms:No data recorded Hallucinations:Hallucinations: None  Ideas of Reference:None  Suicidal Thoughts:Suicidal Thoughts: No  Homicidal Thoughts:Homicidal Thoughts: No   Sensorium  Memory: Immediate Good  Judgment: Fair  Insight: Fair   Executive Functions  Concentration: Poor  Attention Span: Poor  Recall: Fair  Fund of Knowledge: Fair  Language: Fair   Psychomotor Activity  Psychomotor Activity: Psychomotor Activity: Normal   Assets  Assets: Communication Skills   Sleep  Sleep: Sleep: Poor    Physical Exam: Physical Exam Constitutional:      Appearance: Normal appearance.  HENT:     Head: Normocephalic.  Eyes:     Pupils: Pupils are equal, round, and reactive to light.  Musculoskeletal:        General: Normal range of motion.     Cervical back: Normal range of motion.  Neurological:     Mental Status: She is alert and oriented to person, place, and time.    Review of Systems  Constitutional:  Negative for fever.  HENT:  Negative for hearing loss.   Eyes:  Negative for blurred vision.  Respiratory:  Negative for cough.   Cardiovascular:  Negative for chest pain.  Gastrointestinal:  Negative for heartburn.  Genitourinary:  Negative for dysuria.  Musculoskeletal:  Negative for myalgias.  Neurological:  Negative for dizziness.  Psychiatric/Behavioral:  Positive for depression and substance  abuse. Negative for hallucinations, memory loss and suicidal ideas. The patient is nervous/anxious and has insomnia.    Blood pressure 129/75, pulse 70, temperature 98.2 F (36.8 C), temperature source Oral, resp. rate 14, height 5\' 4"  (1.626 m), weight 65.8 kg, SpO2 98 %. Body mass index is 24.89 kg/m.   Treatment Plan Summary: PLAN Safety and Monitoring: Voluntary admission to inpatient psychiatric unit for safety, stabilization and treatment Daily contact with patient to assess and evaluate symptoms and progress in treatment Patient's case to be discussed in multi-disciplinary team meeting Observation Level : q15 minute checks Vital signs: q12 hours Precautions: Safety   Long Term Goal(s): Improvement in symptoms so as ready for discharge   Short Term Goals: Ability to identify changes in lifestyle to reduce recurrence of condition will improve, Ability to verbalize feelings will improve, Ability to disclose and discuss suicidal ideas, Ability to demonstrate self-control will improve, Ability to identify and develop effective coping behaviors will improve, Ability to maintain clinical measurements within normal limits will improve, Compliance with prescribed medications will improve, and Ability to identify triggers associated with substance abuse/mental health issues will improve   Diagnoses:  Principal Problem:   MDD (major depressive disorder), recurrent severe, without psychosis (HCC) Active Problems:   Alcohol use disorder   GAD (generalized anxiety disorder)   GERD (gastroesophageal reflux disease)   Medications -Start Gabapentin 200 mg TID for ETOH use d/o & GAD -Continue Cymbalta 60 mg daily for depressive symptoms -Continue Librium taper for alcohol use disorder -Increase trazodone to 100 mg nightly for sleep, and continue trazodone 50 mg as needed nightly for sleep -Continue hydroxyzine 50 mg 3 times daily as needed for anxiety -Continue Protonix 40 mg daily for GERD    Patient has been educated on rationales, benefits, and possible side effects of all medications as listed above.  She is agreeable to taking medications.   Other PRNS -Continue Tylenol 650 mg every 6 hours PRN for mild pain -Continue Maalox 30 mg every 4 hrs PRN  for indigestion -Continue Imodium 2-4 mg as needed for diarrhea -Continue Milk of Magnesia as needed every 6 hrs for constipation -Continue Zofran disintegrating tabs every 6 hrs PRN for nausea    Discharge Planning: Social work and case management to assist with discharge planning and identification of hospital follow-up needs prior to discharge Estimated LOS: 5-7 days Discharge Concerns: Need to establish a safety plan; Medication compliance and effectiveness Discharge Goals: Return home with outpatient referrals for mental health follow-up including medication management/psychotherapy   I certify that inpatient services furnished can reasonably be expected to improve the patient's condition.      Starleen Blue, NP 04/08/2023, 3:41 PM

## 2023-04-08 NOTE — Progress Notes (Signed)
   04/07/23 2100  Psych Admission Type (Psych Patients Only)  Admission Status Voluntary  Psychosocial Assessment  Patient Complaints Anxiety;Depression;Substance abuse  Eye Contact Fair  Facial Expression Anxious;Wide-eyed  Affect Anxious  Speech Logical/coherent  Interaction Guarded  Motor Activity Slow  Appearance/Hygiene Unremarkable  Behavior Characteristics Appropriate to situation  Mood Depressed;Anxious  Thought Process  Coherency WDL  Content WDL  Delusions None reported or observed  Perception WDL  Hallucination None reported or observed  Judgment Poor  Confusion None  Danger to Self  Current suicidal ideation? Denies  Self-Injurious Behavior No self-injurious ideation or behavior indicators observed or expressed   Agreement Not to Harm Self Yes  Description of Agreement Verbally contracts for safety.  Danger to Others  Danger to Others None reported or observed   Patient alert and oriented. Presenting with an anxious affect and depressed, anxious mood. Patient denies SI, HI, AVH. Patient rates anxiety and depression 10/10. Scheduled medications administered to patient, per provider orders. PRN zofran and hydroxyzine administered, per provider orders. Patient rates pain 7/10 headache of both temples, PRN tylenol administered per provider orders. Support and encouragement provided. Routine safety checks conducted every 15 minutes. Patient verbally contracts for safety and remains safe on the unit.

## 2023-04-08 NOTE — [facility] Group Notes (Signed)
[facility] Group Notes:  (Nursing/MHT/Case Management/Adjunct)  Date:  04/08/2023  Time:  9:41 PM  Type of Therapy:   Wrap-up group  Participation Level:  Active  Participation Quality:  Appropriate  Affect:  Appropriate  Cognitive:  Appropriate  Insight:  Appropriate  Engagement in Group:  Engaged  Modes of Intervention:  Education  Summary of Progress/Problems: Pt goal to attend group and see Dr. Rock Nephew reports she attended groups but didn't see the Dr. Morrie Sheldon 4/10.  Noah Delaine 04/08/2023, 9:41 PM
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
## 2023-04-08 NOTE — Progress Notes (Signed)
Pt A & O X3. Denies SI, HI, AVH; rated headache 7/10, anxiety and depression both 10/10. Per pt "I got Tylenol for my headache, zofran for my nausea and Vistaril for anxiety. I need phenergan and Ativan for my anxiety. I haven't slept in for 2 days. I need to go to bed now". Continues with multiple demands for medications throughout this shift. OOB for meals, medications and couple groups as scheduled. Emotional support, encouragement and reassurance this shift. Safety maintained at Q 15 minutes intervals without incident thus far. Continued verbal education done on current treatment regimen in reference to medications schedule without evidence of learning due to pt's continued demands. Tolerated meals and fluids well. Safety maintained.

## 2023-04-08 NOTE — Progress Notes (Signed)
   04/07/23 2029  Vitals  BP 132/86  MAP (mmHg) 96  BP Location Right Arm  BP Method Automatic  Patient Position (if appropriate) Sitting  Pulse Rate 78   Patient reporting chest pain/anxiety attack and nausea to staff. Scheduled librium and trazodone administered, PRN zofran and hydroxyzine administered. Patient's vitals WNL, [place] NP notified. RN obtained EKG, NSR w/ right atrial enlargement NP made aware.

## 2023-04-09 MED ORDER — GABAPENTIN 300 MG PO CAPS
300.0000 mg | ORAL_CAPSULE | Freq: Three times a day (TID) | ORAL | Status: DC
  Administered 2023-04-09 – 2023-04-13 (×11): 300 mg via ORAL
  Filled 2023-04-09 (×18): qty 1

## 2023-04-09 MED ORDER — HALOPERIDOL 5 MG PO TABS
5.0000 mg | ORAL_TABLET | Freq: Once | ORAL | Status: AC
  Administered 2023-04-09: 5 mg via ORAL

## 2023-04-09 MED ORDER — DIPHENHYDRAMINE HCL 50 MG PO CAPS
50.0000 mg | ORAL_CAPSULE | Freq: Once | ORAL | Status: AC
  Administered 2023-04-09: 50 mg via ORAL

## 2023-04-09 NOTE — Progress Notes (Signed)
Adult Psychoeducational Group Note  Date:  04/09/2023 Time:  9:45 PM  Group Topic/Focus:  Wrap-Up Group:   The focus of this group is to help patients review their daily goal of treatment and discuss progress on daily workbooks.  Participation Level:  Active  Participation Quality:  Appropriate  Affect:  Appropriate  Cognitive:  Appropriate  Insight: Appropriate  Engagement in Group:  Engaged  Modes of Intervention:  Discussion  Additional Comments:  Pt attended and participated in NA meeting.  Tracy Phillips 04/09/2023, 9:45 PM

## 2023-04-09 NOTE — Group Note (Signed)
Date:  04/09/2023 Time:  6:19 PM  Group Topic/Focus:  Goals Group:   The focus of this group is to help patients establish daily goals to achieve during treatment and discuss how the patient can incorporate goal setting into their daily lives to aide in recovery. Orientation:   The focus of this group is to educate the patient on the purpose and policies of crisis stabilization and provide a format to answer questions about their admission.  The group details unit policies and expectations of patients while admitted.    Participation Level:  Did Not Attend  Participation Quality:   n/a  Affect:   n/a  Cognitive:   n/a  Insight: None  Engagement in Group:   n/a  Modes of Intervention:   n/a  Additional Comments:   Pt did not attend.  [name] Kaylan Yates 04/09/2023, 6:19 PM

## 2023-04-09 NOTE — Progress Notes (Signed)
Tracy Phillips Progress Note  04/09/2023 3:10 PM Tracy Phillips  MRN:  [id number] Principal Problem: MDD (major depressive disorder), recurrent severe, without psychosis (HCC) Diagnosis: Principal Problem:   MDD (major depressive disorder), recurrent severe, without psychosis (HCC) Active Problems:   Alcohol use disorder   GAD (generalized anxiety disorder)   GERD (gastroesophageal reflux disease)  Reason for admission: Tracy Phillips is a a 57 yo CF with prior mental health diagnoses of alcohol use d/o, MDD & GAD who presented to the [facility] hospital ER on 5/17 with complaints of worsening depressive symptoms and +SI in the context of alcohol intoxication and psychosocial stressors.  Patient was medically cleared, and transferred voluntarily to this behavioral health Hospital for treatment and stabilization of her mental status.   24 Hr chart Review: Sleep Hours last night: Not documented, but pt reports "I sleep all night". Nursing Concerns: None  Medication Compliance: Compliant Vital Signs in the past 24 hrs: WNL PRN Medications in the past 24 hrs: Zofran, Haldol, Benadryl.  Assessment note: On assessment today, patient was irritable and crying, in the day room, stated that she had reached out to her mother because she wanted some clothing, and mother stated that she was "dealing with something" and unable to drop off the clothing, which made her upset and suicidal. Pt reports that it is the first time in her life that her mother has not come through for her. Empathy and active listening provided. Pt hysterical at the time and agitated, orders given for her RN to given her Haldol 5 mg and Benadryl 50 mg PO, which was effective. Pt denies plan or intent to harm herself and verbally contracts for safety on the unit. She denies HI/AVH or paranoia. There is no evidence of delusional thoughts.    Patient reports a good sleep quality last night, states that last night was the first time since being  hospitalized at this [facility] that she was able to sleep, and states that she feels as though this medication regimen is beginning to help her. She reports a fair appetite, states that she continues to have nausea, wants Zofran scheduled. She is out in the day room attending unit activities and observed to be interacting with staff and peers.  Will increase Gabapentin 300 mg TID for alcohol use d/o & GAD , and continue other medications as listed below. Pt is continuing to tolerate Librium taper. Discharge planning is ongoing with CSW.   Total Time spent with patient: 45 minutes  Past Psychiatric History: See H & P  Past Medical History:  Past Medical History:  Diagnosis Date   Depression    H/O dizziness    H/O eating disorder    H/O: alcohol abuse    Herpes    H/O   History of chicken pox    History of colon polyps    Hyperlipidemia    Hypertension    Kidney stones    H/O stones    Past Surgical History:  Procedure Laterality Date   ABDOMINAL HYSTERECTOMY  1998   APPENDECTOMY  1997   DILATION AND CURETTAGE OF UTERUS     TONSILLECTOMY  1997   Family History:  Family History  Problem Relation Age of Onset   Hyperlipidemia Mother    Hypertension Mother    Breast cancer Mother 61   Arthritis Father    Hyperlipidemia Father    Hypertension Father    Diabetes Father    Hypertension Brother    Arthritis Maternal  Aunt    Hyperlipidemia Maternal Aunt    Hypertension Maternal Aunt    Alcohol abuse Maternal Uncle    Arthritis Maternal Uncle    Hyperlipidemia Maternal Uncle    Hypertension Maternal Uncle    Arthritis Paternal Aunt    Hyperlipidemia Paternal Aunt    Hypertension Paternal Aunt    Arthritis Paternal Uncle    Hyperlipidemia Paternal Uncle    Hypertension Paternal Uncle    Arthritis Maternal Grandmother    Hyperlipidemia Maternal Grandmother    Hypertension Maternal Grandmother    Arthritis Maternal Grandfather    Lung cancer Maternal Grandfather    Prostate  cancer Maternal Grandfather    Hyperlipidemia Maternal Grandfather    Stroke Maternal Grandfather    Hypertension Maternal Grandfather    Arthritis Paternal Grandmother    Hyperlipidemia Paternal Grandmother    Hypertension Paternal Grandmother    Alcohol abuse Paternal Grandfather    Arthritis Paternal Grandfather    Hyperlipidemia Paternal Grandfather    Hypertension Paternal Grandfather    Sudden death Paternal Grandfather    Family Psychiatric  History: See H & P Social History:  Social History   Substance and Sexual Activity  Alcohol Use No   Comment: sober since 10/27/2010     Social History   Substance and Sexual Activity  Drug Use No    Social History   Socioeconomic History   Marital status: Single    Spouse name: Not on file   Number of children: Not on file   Years of education: Not on file   Highest education level: Not on file  Occupational History   Not on file  Tobacco Use   Smoking status: Never   Smokeless tobacco: Never  Vaping Use   Vaping Use: Never used  Substance and Sexual Activity   Alcohol use: No    Comment: sober since 10/27/2010   Drug use: No   Sexual activity: Not on file  Other Topics Concern   Not on file  Social History Narrative   Not on file   Social Determinants of Health   Financial Resource Strain: Not on file  Food Insecurity: No Food Insecurity (04/06/2023)   Hunger Vital Sign    Worried About Running Out of Food in the Last Year: Never true    Ran Out of Food in the Last Year: Never true  Transportation Needs: No Transportation Needs (04/06/2023)   PRAPARE - Administrator, Civil Service (Medical): No    Lack of Transportation (Non-Medical): No  Physical Activity: Not on file  Stress: Not on file  Social Connections: Not on file   Sleep: Poor  Appetite:  Poor  Current Medications: Current Facility-Administered Medications  Medication Dose Route Frequency Provider Last Rate Last Admin    acetaminophen (TYLENOL) tablet 650 mg  650 mg Oral Q6H PRN Bennett, Christal H, NP   650 mg at 04/08/23 0756   alum & mag hydroxide-simeth (MAALOX/MYLANTA) 200-200-20 MG/5ML suspension 30 mL  30 mL Oral Q4H PRN Bennett, Christal H, NP       chlordiazePOXIDE (LIBRIUM) capsule 25 mg  25 mg Oral Q6H PRN Verlie Liotta, NP       chlordiazePOXIDE (LIBRIUM) capsule 25 mg  25 mg Oral BH-qamhs Danese Dorsainvil, NP   25 mg at 04/09/23 0818   Followed by   Melene Muller ON 04/10/2023] chlordiazePOXIDE (LIBRIUM) capsule 25 mg  25 mg Oral Daily Duha Abair, NP       diphenhydrAMINE (BENADRYL) capsule 50  mg  50 mg Oral TID PRN Willeen Cass, Christal H, NP   50 mg at 04/07/23 0454   Or   diphenhydrAMINE (BENADRYL) injection 50 mg  50 mg Intramuscular TID PRN Bennett, Christal H, NP       DULoxetine (CYMBALTA) DR capsule 60 mg  60 mg Oral Daily Starleen Blue, NP   60 mg at 04/09/23 0815   folic acid (FOLVITE) tablet 1 mg  1 mg Oral Daily Bennett, Christal H, NP   1 mg at 04/09/23 0816   gabapentin (NEURONTIN) capsule 300 mg  300 mg Oral TID Starleen Blue, NP       haloperidol (HALDOL) tablet 5 mg  5 mg Oral TID PRN Willeen Cass, Christal H, NP   5 mg at 04/07/23 0455   Or   haloperidol lactate (HALDOL) injection 5 mg  5 mg Intramuscular TID PRN Bennett, Christal H, NP       hydrOXYzine (ATARAX) tablet 50 mg  50 mg Oral TID PRN Starleen Blue, NP   50 mg at 04/09/23 0818   loperamide (IMODIUM) capsule 2-4 mg  2-4 mg Oral PRN Starleen Blue, NP       magnesium hydroxide (MILK OF MAGNESIA) suspension 30 mL  30 mL Oral Daily PRN Bennett, Christal H, NP       multivitamin with minerals tablet 1 tablet  1 tablet Oral Daily Bennett, Christal H, NP   1 tablet at 04/09/23 0816   ondansetron (ZOFRAN) tablet 4 mg  4 mg Oral Q8H PRN Bobbitt, Shalon E, NP   4 mg at 04/09/23 0818   pantoprazole (PROTONIX) EC tablet 40 mg  40 mg Oral Daily Starleen Blue, NP   40 mg at 04/09/23 0815   thiamine (Vitamin B-1) tablet 100 mg  100 mg Oral Daily  Bennett, Christal H, NP   100 mg at 04/09/23 0815   traZODone (DESYREL) tablet 100 mg  100 mg Oral QHS Starleen Blue, NP   100 mg at 04/08/23 2104   traZODone (DESYREL) tablet 50 mg  50 mg Oral QHS PRN Thurston Hole H, NP   50 mg at 04/06/23 2146    Lab Results:  Results for orders placed or performed during the hospital encounter of 04/06/23 (from the past 48 hour(s))  Hemoglobin A1c     Status: Abnormal   Collection Time: 04/08/23  6:35 AM  Result Value Ref Range   Hgb A1c MFr Bld 5.7 (H) 4.8 - 5.6 %    Comment: (NOTE) Pre diabetes:          5.7%-6.4%  Diabetes:              >6.4%  Glycemic control for   <7.0% adults with diabetes    Mean Plasma Glucose 116.89 mg/dL    Comment: Performed at [facility], [address] [place], [place] [address]  Lipid panel     Status: Abnormal   Collection Time: 04/08/23  6:35 AM  Result Value Ref Range   Cholesterol 226 (H) 0 - 200 mg/dL   Triglycerides 409 <811 mg/dL   HDL 59 >91 mg/dL   Total CHOL/HDL Ratio 3.8 RATIO   VLDL 30 0 - 40 mg/dL   LDL Cholesterol 478 (H) 0 - 99 mg/dL    Comment:        Total Cholesterol/HDL:CHD Risk Coronary Heart Disease Risk Table                     Men  Women  1/2 Average Risk   3.4   3.3  Average Risk       5.0   4.4  2 X Average Risk   9.6   7.1  3 X Average Risk  23.4   11.0        Use the calculated Patient Ratio above and the CHD Risk Table to determine the patient's CHD Risk.        ATP III CLASSIFICATION (LDL):  <100     mg/dL   Optimal  409-811  mg/dL   Near or Above                    Optimal  130-159  mg/dL   Borderline  914-782  mg/dL   High  >956     mg/dL   Very High Performed at [facility], [address], [place], [place] [address]   TSH     Status: None   Collection Time: 04/08/23  6:35 AM  Result Value Ref Range   TSH 2.288 0.350 - 4.500 uIU/mL    Comment: Performed by a 3rd Generation assay with a functional sensitivity of <=0.01  uIU/mL. Performed at [facility], [address], [place], [place] [address]   VITAMIN D 25 Hydroxy (Vit-D Deficiency, Fractures)     Status: None   Collection Time: 04/08/23  6:35 AM  Result Value Ref Range   Vit D, 25-Hydroxy 46.37 30 - 100 ng/mL    Comment: (NOTE) Vitamin D deficiency has been defined by the Institute of Medicine  and an Endocrine Society practice guideline as a level of serum 25-OH  vitamin D less than 20 ng/mL (1,2). The Endocrine Society went on to  further define vitamin D insufficiency as a level between 21 and 29  ng/mL (2).  1. IOM (Institute of Medicine). 2010. Dietary reference intakes for  calcium and D. [place]: [employer]. 2. Holick MF, Binkley [place], Bischoff-Ferrari HA, et al. Evaluation,  treatment, and prevention of vitamin D deficiency: an Endocrine  Society clinical practice guideline, JCEM. 2011 Jul; 96(7): 1911-30.  Performed at [facility], [address], [place], [place] [address]     Blood Alcohol level:  Lab Results  Component Value Date   ETH [address] [facility]) 04/03/2023    Metabolic Disorder Labs: Lab Results  Component Value Date   HGBA1C 5.7 (H) 04/08/2023   MPG 116.89 04/08/2023   No results found for: "PROLACTIN" Lab Results  Component Value Date   CHOL 226 (H) 04/08/2023   TRIG 149 04/08/2023   HDL 59 04/08/2023   CHOLHDL 3.8 04/08/2023   VLDL 30 04/08/2023   LDLCALC 137 (H) 04/08/2023    Physical Findings: AIMS:  , ,  ,  ,    CIWA:  CIWA-Ar Total: 0 COWS:     Musculoskeletal: Strength & Muscle Tone: within normal limits Gait & Station: normal Patient leans: N/A  Psychiatric Specialty Exam:  Presentation  General Appearance:  Appropriate for Environment; Fairly Groomed  Eye Contact: Fair  Speech: Clear and Coherent  Speech Volume: Normal  Handedness: Right   Mood and Affect  Mood: Anxious; Depressed  Affect: Congruent   Thought Process   Thought Processes: Coherent  Descriptions of Associations:Intact  Orientation:Full (Time, Place and Person)  Thought Content:Logical  History of Schizophrenia/Schizoaffective disorder:No  Duration of Psychotic Symptoms:No data recorded Hallucinations:Hallucinations: None  Ideas of Reference:None  Suicidal Thoughts:Suicidal Thoughts: Yes, Passive SI Passive  Intent and/or Plan: Without Intent; Without Plan  Homicidal Thoughts:Homicidal Thoughts: No   Sensorium  Memory: Immediate Good  Judgment: Fair  Insight: Fair   Chartered certified accountant: Fair  Attention Span: Fair  Recall: Good  Fund of Knowledge: Fair  Language: Fair   Psychomotor Activity  Psychomotor Activity: Psychomotor Activity: Normal   Assets  Assets: Communication Skills   Sleep  Sleep: Sleep: Good    Physical Exam: Physical Exam Constitutional:      Appearance: Normal appearance.  HENT:     Head: Normocephalic.  Eyes:     Pupils: Pupils are equal, round, and reactive to light.  Musculoskeletal:        General: Normal range of motion.     Cervical back: Normal range of motion.  Neurological:     Mental Status: She is alert and oriented to person, place, and time.    Review of Systems  Constitutional:  Negative for fever.  HENT:  Negative for hearing loss.   Eyes:  Negative for blurred vision.  Respiratory:  Negative for cough.   Cardiovascular:  Negative for chest pain.  Gastrointestinal:  Negative for heartburn.  Genitourinary:  Negative for dysuria.  Musculoskeletal:  Negative for myalgias.  Neurological:  Negative for dizziness.  Psychiatric/Behavioral:  Positive for depression and substance abuse. Negative for hallucinations, memory loss and suicidal ideas. The patient is nervous/anxious and has insomnia.    Blood pressure (!) 141/78, pulse 72, temperature 98.2 F (36.8 C), temperature source Oral, resp. rate 18, height 5\' 4"  (1.626 m), weight  65.8 kg, SpO2 98 %. Body mass index is 24.89 kg/m.   Treatment Plan Summary: PLAN Safety and Monitoring: Voluntary admission to inpatient psychiatric unit for safety, stabilization and treatment Daily contact with patient to assess and evaluate symptoms and progress in treatment Patient's case to be discussed in multi-disciplinary team meeting Observation Level : q15 minute checks Vital signs: q12 hours Precautions: Safety   Long Term Goal(s): Improvement in symptoms so as ready for discharge   Short Term Goals: Ability to identify changes in lifestyle to reduce recurrence of condition will improve, Ability to verbalize feelings will improve, Ability to disclose and discuss suicidal ideas, Ability to demonstrate self-control will improve, Ability to identify and develop effective coping behaviors will improve, Ability to maintain clinical measurements within normal limits will improve, Compliance with prescribed medications will improve, and Ability to identify triggers associated with substance abuse/mental health issues will improve   Diagnoses:  Principal Problem:   MDD (major depressive disorder), recurrent severe, without psychosis (HCC) Active Problems:   Alcohol use disorder   GAD (generalized anxiety disorder)   GERD (gastroesophageal reflux disease)   Medications -Increase Gabapentin to 300 mg TID for ETOH use d/o & GAD -Continue Cymbalta 60 mg daily for depressive symptoms -Continue Librium taper for alcohol use disorder -Continue trazodone 100 mg nightly for sleep, and continue trazodone 50 mg as needed nightly for sleep -Continue hydroxyzine 50 mg 3 times daily as needed for anxiety -Continue Protonix 40 mg daily for GERD   Patient has been educated on rationales, benefits, and possible side effects of all medications as listed above.  She is agreeable to taking medications.   Other PRNS -Continue Tylenol 650 mg every 6 hours PRN for mild pain -Continue Maalox 30 mg  every 4 hrs PRN for indigestion -Continue Imodium 2-4 mg as needed for diarrhea -Continue Milk of Magnesia as needed every 6 hrs for constipation -Continue Zofran disintegrating tabs every 6  hrs PRN for nausea    Discharge Planning: Social work and case management to assist with discharge planning and identification of hospital follow-up needs prior to discharge Estimated LOS: 5-7 days Discharge Concerns: Need to establish a safety plan; Medication compliance and effectiveness Discharge Goals: Return home with outpatient referrals for mental health follow-up including medication management/psychotherapy   I certify that inpatient services furnished can reasonably be expected to improve the patient's condition.      Starleen Blue, NP 04/09/2023, 3:10 PMPatient ID: Tracy Phillips, female   DOB: [birth year], 57 y.o.   MRN: [id number]

## 2023-04-09 NOTE — Progress Notes (Signed)
   04/08/23 2300  Psych Admission Type (Psych Patients Only)  Admission Status Voluntary  Psychosocial Assessment  Patient Complaints Anxiety;Agitation  Eye Contact Fair  Facial Expression Anxious  Affect Anxious  Speech Logical/coherent  Interaction Needy  Motor Activity Slow  Appearance/Hygiene Unremarkable  Behavior Characteristics Cooperative;Anxious  Mood Anxious  Thought Process  Coherency WDL  Content WDL  Delusions WDL  Perception WDL  Hallucination None reported or observed  Judgment Poor  Confusion None  Danger to Self  Current suicidal ideation? Denies  Self-Injurious Behavior No self-injurious ideation or behavior indicators observed or expressed   Agreement Not to Harm Self Yes  Description of Agreement Verbal  Danger to Others  Danger to Others None reported or observed   D: Pt alert and oriented. Pt rates depression 8/10 and anxiety 8/10.  Pt denies experiencing any SI/HI, or AVH at this time.   A: Scheduled medications administered to pt, per MD orders. Support and encouragement provided. Frequent verbal contact made. Routine safety checks conducted q15 minutes.   R: No adverse drug reactions noted. Pt verbally contracts for safety at this time. Pt complaint with medications and treatment plan. Pt interacts well with others on the unit. Pt remains safe at this time. Will continue to monitor.

## 2023-04-09 NOTE — [facility] Group Notes (Signed)
Chaplain received a consult to provide grief support to Tracy Phillips who lost her father recently after a long struggle with dementia.  Her grief, coupled with running out of her medications caused her to have SI. She remembered someone at AA once saying "If you have to choose between killing yourself and taking a drink, take a drink."  She did this and that had several weeks of drinking heavily after 14 years of sobriety.  She has a lot of shame about losing her sobriety and feels that her dad would be disappointed in her if he were alive.  Chaplain provided listening and emotional support by highlighting the ways in which Tracy Phillips was doing what she knew to do at the time to stay alive.  Chaplain also acknowledged how much strength and courage it took to accept her mother's help the night that her mom invited her to come and stay over for the night.  She worked with her mother and brother to make the decision to come to the hospital to detox.  Chaplain encouraged her to give herself grace while going through deep grief.  Chaplain also noted that now she knows that she has 3 choices if she ever comes to that place again where she feels she needs to choose between staying alive or staying sober.  She can choose to get help and support just as she did this time.  Tracy Phillips felt that one of the reasons she had relapsed is because she had not allowed herself to thank God each day for her sobriety.  Chaplain invited her to join in a prayer of gratitude for 5 days sober, which she accepted with gratitude.  [address] Bcc Pager, [phone]

## 2023-04-09 NOTE — Progress Notes (Signed)
Pt reported she slept great last night with fair appetite "I feel much better today. The sleep medications helped". Presented with flat affect but brightened up on interactions; an improvement from yesterday. Rated her anxiety 8/10, depression 7/10 "I just need to feel better". Received PRN Zofran and Vistaril as ordered earlier this shift with desired effect when reassessed at 0915. Pt verbalized SI, crying at noon "My mom said she can't bring me no clothes and that she's going through some stuff". Received OTO of Haldol 5 mg and Benadryl 50 mg at approximately 1400 with desired effect. Pt attended scheduled groups, engaged in activities on and off milieu. Safety maintained at Q 15 minutes intervals without self harm gestures / outburst. Pt is medication compliant without adverse drug reactions. Tolerated meals and fluids well. Denies concerns at this time.

## 2023-04-09 NOTE — Group Note (Signed)
Recreation Therapy Group Note   Group Topic:Health and Wellness  Group Date: 04/09/2023 Start Time: 1400 End Time: 1450 Facilitators: Taariq Leitz, Benito Mccreedy, LRT Location: [address]  Activity Description/Intervention: Therapeutic Drumming. Patients with peers and staff were given the opportunity to engage in a leader facilitated HealthRHYTH[facility] Group Empowerment Drumming Circle with staff from the [employer], in partnership with [employer]. Teaching laboratory technician and trained [employer], Theodoro Doing leading with LRT observing and documenting intervention and pt response. This evidenced-based practice targets 7 areas of health and wellbeing in the human experience including: stress-reduction, exercise, self-expression, camaraderie/support, nurturing, spirituality, and music-making (leisure).   Goal Area(s) Addresses:  Patient will engage in pro-social way in music group.  Patient will follow directions of drum leader on the first prompt. Patient will demonstrate no behavioral issues during group.  Patient will identify if a reduction in stress level occurs as a result of participation in therapeutic drum circle.    Education: Leisure exposure, Pharmacologist, Musical expression, Discharge Planning   Affect/Mood: Congruent and Full range   Participation Level: Engaged   Participation Quality: Independent   Behavior: Attentive , Cooperative, and Interactive    Speech/Thought Process: Coherent, Directed, and Oriented   Insight: Moderate   Judgement: Moderate   Modes of Intervention: Activity, Teaching laboratory technician, Music, and Socialization   Patient Response to Interventions:  Interested  and Receptive   Education Outcome:  Acknowledges education   Clinical Observations/Individualized Feedback: Tracy Phillips actively engaged in therapeutic drumming exercise and discussions. Pt was appropriate with musical equipment for duration of programming. Pt was expressive and  openly shared a worry or fear with the drum circle to be validated. Pt identified anxiety as a challenging emotion for them today. Pt then rated anxiety on a scale of 1-10, 10 being highest a "10" before activity participation, and a "9" at conclusion of intervention. Pt shared a word to describe their drumming experience as "inspiring".   Benito Mccreedy Ersel Wadleigh, LRT, CTRS 04/09/2023 4:14 PM

## 2023-04-09 NOTE — [facility] Group Notes (Signed)
Spiritual care group on grief and loss facilitated by chaplain Dyanne Carrel, [facility]  Group Goal:  Support / Education around grief and loss  Members engage in facilitated group support and psycho-social education.  Group Description:  Following introductions and group rules, group members engaged in facilitated group dialog and support around topic of loss, with particular support around experiences of loss in their lives. Group Identified types of loss (relationships / self / things) and identified patterns, circumstances, and changes that precipitate losses. Reflected on thoughts / feelings around loss, normalized grief responses, and recognized variety in grief experience. Group noted Worden's four tasks of grief in discussion.  Group drew on Adlerian / Rogerian, narrative, MI,  Patient Progress: Tracy Phillips attended group and participated and engaged in group conversation.  She was very tearful.  Chaplain will follow up with her tomorrow.

## 2023-04-10 MED ORDER — SODIUM CHLORIDE 0.9 % IN NEBU
INHALATION_SOLUTION | RESPIRATORY_TRACT | Status: AC
  Filled 2023-04-10: qty 9

## 2023-04-10 MED ORDER — ONDANSETRON HCL 4 MG PO TABS
4.0000 mg | ORAL_TABLET | Freq: Four times a day (QID) | ORAL | Status: DC
  Administered 2023-04-10 – 2023-04-11 (×5): 4 mg via ORAL
  Filled 2023-04-10 (×14): qty 1

## 2023-04-10 MED ORDER — DULOXETINE HCL 30 MG PO CPEP
30.0000 mg | ORAL_CAPSULE | Freq: Once | ORAL | Status: AC
  Administered 2023-04-10: 30 mg via ORAL
  Filled 2023-04-10: qty 1

## 2023-04-10 MED ORDER — SENNOSIDES-DOCUSATE SODIUM 8.6-50 MG PO TABS
1.0000 | ORAL_TABLET | Freq: Two times a day (BID) | ORAL | Status: DC
  Administered 2023-04-10 – 2023-04-13 (×5): 1 via ORAL
  Filled 2023-04-10 (×11): qty 1

## 2023-04-10 MED ORDER — DULOXETINE HCL 30 MG PO CPEP
90.0000 mg | ORAL_CAPSULE | Freq: Every day | ORAL | Status: DC
  Administered 2023-04-11 – 2023-04-13 (×3): 90 mg via ORAL
  Filled 2023-04-10 (×5): qty 3

## 2023-04-10 MED ORDER — WHITE PETROLATUM EX OINT
TOPICAL_OINTMENT | CUTANEOUS | Status: AC
  Filled 2023-04-10: qty 5

## 2023-04-10 NOTE — [facility] Suicide Risk Assessment (Signed)
[facility] INPATIENT:  Family/Significant Other Suicide Prevention Education  Suicide Prevention Education:  Contact Attempts: [name] ( mom) [phone], (name of family member/significant other) has been identified by the patient as the family member/significant other with whom the patient will be residing, and identified as the person(s) who will aid the patient in the event of a mental health crisis.  With written consent from the patient, two attempts were made to provide suicide prevention education, prior to and/or following the patient's discharge.  We were unsuccessful in providing suicide prevention education.  A suicide education pamphlet was given to the patient to share with family/significant other.  Date and time of first attempt:04/08/2023@1 :10 PM Date and time of second attempt:04/10/2023@11 :18 AM  Tracy Phillips 04/10/2023, 11:19 AM

## 2023-04-10 NOTE — [facility] Suicide Risk Assessment (Signed)
[facility] INPATIENT:  Family/Significant Other Suicide Prevention Education  Suicide Prevention Education:  Education Completed; Tressa Busman (mom) [phone] ,  (name of family member/significant other) has been identified by the patient as the family member/significant other with whom the patient will be residing, and identified as the person(s) who will aid the patient in the event of a mental health crisis (suicidal ideations/suicide attempt).  With written consent from the patient, the family member/significant other has been provided the following suicide prevention education, prior to the and/or following the discharge of the patient.  The suicide prevention education provided includes the following: Suicide risk factors Suicide prevention and interventions National Suicide Hotline telephone number [facility] assessment telephone number [facility] Emergency Assistance 911 [facility] and/or Residential Mobile Crisis Unit telephone number  Request made of family/significant other to: Remove weapons (e.g., guns, rifles, knives), all items previously/currently identified as safety concern.   Remove drugs/medications (over-the-counter, prescriptions, illicit drugs), all items previously/currently identified as a safety concern.  The family member/significant other verbalizes understanding of the suicide prevention education information provided.  The family member/significant other agrees to remove the items of safety concern listed above.  Tracy Phillips 04/10/2023, 3:04 PM

## 2023-04-10 NOTE — Progress Notes (Signed)
   04/10/23 0630  15 Minute Checks  Location Bathroom/Shower  Visual Appearance Calm  Behavior Composed  Sleep (Behavioral Health Patients Only)  Calculate sleep? (Click Yes once per 24 hr at 0600 safety check) Yes  Documented sleep last 24 hours 7

## 2023-04-10 NOTE — Plan of Care (Signed)
  Problem: Education: Goal: Knowledge of General Education information will improve Description: Including pain rating scale, medication(s)/side effects and non-pharmacologic comfort measures Outcome: Progressing   Problem: Coping: Goal: Level of anxiety will decrease Outcome: Progressing   

## 2023-04-10 NOTE — Progress Notes (Addendum)
Patient rates anxiety 9/10, depression 7/10 and hopelessness 5/10 on self inventory. Denies SI, HI and AVH. Patient's goals are to attend group and spend less time alone. Complained of tingling in arm and feeling faint this morning. Vital signs were stable and patient was encouraged to lie down. Patient complained of nausea and tremor this morning and received PRN zofran to some relief. Patient continues to seek a fair amount of attention. Reports that mood has improved since she's been here. Remains safe with Q 15 minute checks.   04/10/23 1000  Psych Admission Type (Psych Patients Only)  Admission Status Voluntary  Psychosocial Assessment  Patient Complaints Anxiety  Eye Contact Fair;Intense  Facial Expression Animated  Affect Appropriate to circumstance  Speech Logical/coherent  Interaction Needy  Motor Activity Other (Comment) (WDL)  Appearance/Hygiene Unremarkable  Behavior Characteristics Cooperative  Mood Anxious  Thought Process  Coherency Circumstantial  Content WDL  Delusions None reported or observed  Perception WDL  Hallucination None reported or observed  Judgment Poor  Confusion None  Danger to Self  Current suicidal ideation? Denies  Self-Injurious Behavior No self-injurious ideation or behavior indicators observed or expressed   Agreement Not to Harm Self Yes  Description of Agreement verbal

## 2023-04-10 NOTE — Progress Notes (Signed)
[facility] MD Progress Note  04/10/2023 3:02 PM Tracy Phillips  MRN:  [id number] Principal Problem: MDD (major depressive disorder), recurrent severe, without psychosis (HCC) Diagnosis: Principal Problem:   MDD (major depressive disorder), recurrent severe, without psychosis (HCC) Active Problems:   Alcohol use disorder   GAD (generalized anxiety disorder)   GERD (gastroesophageal reflux disease)  Reason for admission: Tracy Phillips is a a 57 yo CF with prior mental health diagnoses of alcohol use d/o, MDD & GAD who presented to the [facility] hospital ER on 5/17 with complaints of worsening depressive symptoms and +SI in the context of alcohol intoxication and psychosocial stressors.  Patient was medically cleared, and transferred voluntarily to this behavioral health Hospital for treatment and stabilization of her mental status.   24 Hr chart Review: Sleep Hours last night: Not documented, but pt reports that her sleep quality last night was good. Nursing Concerns: None  Medication Compliance: Compliant Vital Signs in the past 24 hrs: WNL PRN Medications in the past 24 hrs: Hydroxyzine.  Assessment note: On assessment today, patient presents with a very depressed mood, she is tearful during assessment, and as per my assessment, is genuinely depressed and not ready for discharge at this time as she could benefit from more time being hospitalized to ensure that mood is stable enough for hospitalization.  She does not seem to be malingering, as she did not dispute when I discussed the possibility of her being discharged tomorrow.  However, she is reporting a depressed mood and rates depression as 8, 10 being worse.  She rates anxiety as 9, 10 being worst.  She seems to be guarded regarding what exactly her stressors are.  Patient endorsed suicidal ideations earlier today morning, but denied any plans or intent to harm herself.  She verbally contracted for safety on the unit.  She denies HI/AVH, she  denies paranoia, and there is no evidence of delusional thinking.  Patient reports a good sleep quality last night, states that last night. She reports a fair appetite, states that she continues to have nausea, wants Zofran scheduled.  This has been scheduled for her as per the [facility].  Patient is making efforts to be out in the day room attending unit activities and observed to be interacting with staff and peers.  We increased Gabapentin to 300 mg TID yesterday for alcohol use d/o & GAD.  Patient reports that this medication has been helpful in management of her anxiety.  We will further increase Cymbalta today to 90 mg daily for management of depressive symptoms and anxiety.  Patient already took 60 mg earlier today morning.  We will give an additional 30 mg now, and start 90 mg tomorrow morning.  Librium taper has been completed.  We will continue other medications as listed below.  Projected discharge date for patient is Monday, 27 May if all discharge follow-up appointments are in place, and safety planning completed by CSW, and mood is stable enough for discharge.  Total Time spent with patient: 45 minutes  Past Psychiatric History: See H & P  Past Medical History:  Past Medical History:  Diagnosis Date   Depression    H/O dizziness    H/O eating disorder    H/O: alcohol abuse    Herpes    H/O   History of chicken pox    History of colon polyps    Hyperlipidemia    Hypertension    Kidney stones    H/O stones  Past Surgical History:  Procedure Laterality Date   ABDOMINAL HYSTERECTOMY  1998   APPENDECTOMY  1997   DILATION AND CURETTAGE OF UTERUS     TONSILLECTOMY  1997   Family History:  Family History  Problem Relation Age of Onset   Hyperlipidemia Mother    Hypertension Mother    Breast cancer Mother 64   Arthritis Father    Hyperlipidemia Father    Hypertension Father    Diabetes Father    Hypertension Brother    Arthritis Maternal Aunt    Hyperlipidemia Maternal  Aunt    Hypertension Maternal Aunt    Alcohol abuse Maternal Uncle    Arthritis Maternal Uncle    Hyperlipidemia Maternal Uncle    Hypertension Maternal Uncle    Arthritis Paternal Aunt    Hyperlipidemia Paternal Aunt    Hypertension Paternal Aunt    Arthritis Paternal Uncle    Hyperlipidemia Paternal Uncle    Hypertension Paternal Uncle    Arthritis Maternal Grandmother    Hyperlipidemia Maternal Grandmother    Hypertension Maternal Grandmother    Arthritis Maternal Grandfather    Lung cancer Maternal Grandfather    Prostate cancer Maternal Grandfather    Hyperlipidemia Maternal Grandfather    Stroke Maternal Grandfather    Hypertension Maternal Grandfather    Arthritis Paternal Grandmother    Hyperlipidemia Paternal Grandmother    Hypertension Paternal Grandmother    Alcohol abuse Paternal Grandfather    Arthritis Paternal Grandfather    Hyperlipidemia Paternal Grandfather    Hypertension Paternal Grandfather    Sudden death Paternal Grandfather    Family Psychiatric  History: See H & P Social History:  Social History   Substance and Sexual Activity  Alcohol Use No   Comment: sober since 10/27/2010     Social History   Substance and Sexual Activity  Drug Use No    Social History   Socioeconomic History   Marital status: Single    Spouse name: Not on file   Number of children: Not on file   Years of education: Not on file   Highest education level: Not on file  Occupational History   Not on file  Tobacco Use   Smoking status: Never   Smokeless tobacco: Never  Vaping Use   Vaping Use: Never used  Substance and Sexual Activity   Alcohol use: No    Comment: sober since 10/27/2010   Drug use: No   Sexual activity: Not on file  Other Topics Concern   Not on file  Social History Narrative   Not on file   Social Determinants of Health   Financial Resource Strain: Not on file  Food Insecurity: No Food Insecurity (04/06/2023)   Hunger Vital Sign     Worried About Running Out of Food in the Last Year: Never true    Ran Out of Food in the Last Year: Never true  Transportation Needs: No Transportation Needs (04/06/2023)   PRAPARE - Administrator, Civil Service (Medical): No    Lack of Transportation (Non-Medical): No  Physical Activity: Not on file  Stress: Not on file  Social Connections: Not on file   Sleep: Poor  Appetite:  Poor  Current Medications: Current Facility-Administered Medications  Medication Dose Route Frequency Provider Last Rate Last Admin   acetaminophen (TYLENOL) tablet 650 mg  650 mg Oral Q6H PRN Bennett, Christal H, NP   650 mg at 04/08/23 0756   alum & mag hydroxide-simeth (MAALOX/MYLANTA) 200-200-20 MG/5ML suspension  30 mL  30 mL Oral Q4H PRN Bennett, Christal H, NP       diphenhydrAMINE (BENADRYL) capsule 50 mg  50 mg Oral TID PRN Willeen Cass, Christal H, NP   50 mg at 04/07/23 0454   Or   diphenhydrAMINE (BENADRYL) injection 50 mg  50 mg Intramuscular TID PRN Bennett, Christal H, NP       DULoxetine (CYMBALTA) DR capsule 30 mg  30 mg Oral Once Starleen Blue, NP       Melene Muller ON 04/11/2023] DULoxetine (CYMBALTA) DR capsule 90 mg  90 mg Oral Daily Alexys Lobello, NP       folic acid (FOLVITE) tablet 1 mg  1 mg Oral Daily Bennett, Christal H, NP   1 mg at 04/10/23 1610   gabapentin (NEURONTIN) capsule 300 mg  300 mg Oral TID Starleen Blue, NP   300 mg at 04/10/23 1357   haloperidol (HALDOL) tablet 5 mg  5 mg Oral TID PRN Thurston Hole H, NP   5 mg at 04/07/23 0455   Or   haloperidol lactate (HALDOL) injection 5 mg  5 mg Intramuscular TID PRN Bennett, Christal H, NP       hydrOXYzine (ATARAX) tablet 50 mg  50 mg Oral TID PRN Starleen Blue, NP   50 mg at 04/10/23 1400   magnesium hydroxide (MILK OF MAGNESIA) suspension 30 mL  30 mL Oral Daily PRN Bennett, Christal H, NP       multivitamin with minerals tablet 1 tablet  1 tablet Oral Daily Bennett, Christal H, NP   1 tablet at 04/10/23 0817    ondansetron (ZOFRAN) tablet 4 mg  4 mg Oral QID Starleen Blue, NP   4 mg at 04/10/23 1057   pantoprazole (PROTONIX) EC tablet 40 mg  40 mg Oral Daily Starleen Blue, NP   40 mg at 04/10/23 9604   senna-docusate (Senokot-S) tablet 1 tablet  1 tablet Oral BID Starleen Blue, NP       thiamine (Vitamin B-1) tablet 100 mg  100 mg Oral Daily Bennett, Christal H, NP   100 mg at 04/10/23 0817   traZODone (DESYREL) tablet 100 mg  100 mg Oral QHS Kojo Liby, NP   100 mg at 04/09/23 2133   traZODone (DESYREL) tablet 50 mg  50 mg Oral QHS PRN Thurston Hole H, NP   50 mg at 04/06/23 2146    Lab Results:  No results found for this or any previous visit (from the past 48 hour(s)).   Blood Alcohol level:  Lab Results  Component Value Date   ETH 307 [facility]) 04/03/2023    Metabolic Disorder Labs: Lab Results  Component Value Date   HGBA1C 5.7 (H) 04/08/2023   MPG 116.89 04/08/2023   No results found for: "PROLACTIN" Lab Results  Component Value Date   CHOL 226 (H) 04/08/2023   TRIG 149 04/08/2023   HDL 59 04/08/2023   CHOLHDL 3.8 04/08/2023   VLDL 30 04/08/2023   LDLCALC 137 (H) 04/08/2023    Physical Findings: AIMS:  , ,  ,  ,    CIWA:  CIWA-Ar Total: 3 COWS:     Musculoskeletal: Strength & Muscle Tone: within normal limits Gait & Station: normal Patient leans: N/A  Psychiatric Specialty Exam:  Presentation  General Appearance:  Appropriate for Environment; Fairly Groomed  Eye Contact: Fair  Speech: Clear and Coherent  Speech Volume: Normal  Handedness: Right   Mood and Affect  Mood: Depressed; Anxious  Affect: Congruent  Thought Process  Thought Processes: Coherent  Descriptions of Associations:Intact  Orientation:Full (Time, Place and Person)  Thought Content:Logical  History of Schizophrenia/Schizoaffective disorder:No  Duration of Psychotic Symptoms:No data recorded Hallucinations:Hallucinations: None  Ideas of  Reference:None  Suicidal Thoughts:Suicidal Thoughts: Yes, Active SI Active Intent and/or Plan: Without Intent; Without Plan SI Passive Intent and/or Plan: Without Intent; Without Plan  Homicidal Thoughts:Homicidal Thoughts: No   Sensorium  Memory: Immediate Good  Judgment: Fair  Insight: Fair   Chartered certified accountant: Fair  Attention Span: Fair  Recall: [employer] of Knowledge: Fair  Language: Fair   Psychomotor Activity  Psychomotor Activity: Psychomotor Activity: Normal   Assets  Assets: Communication Skills   Sleep  Sleep: Sleep: Good    Physical Exam: Physical Exam Constitutional:      Appearance: Normal appearance.  HENT:     Head: Normocephalic.  Eyes:     Pupils: Pupils are equal, round, and reactive to light.  Musculoskeletal:        General: Normal range of motion.     Cervical back: Normal range of motion.  Neurological:     Mental Status: She is alert and oriented to person, place, and time.    Review of Systems  Constitutional:  Negative for fever.  HENT:  Negative for hearing loss.   Eyes:  Negative for blurred vision.  Respiratory:  Negative for cough.   Cardiovascular:  Negative for chest pain.  Gastrointestinal:  Negative for heartburn.  Genitourinary:  Negative for dysuria.  Musculoskeletal:  Negative for myalgias.  Neurological:  Negative for dizziness.  Psychiatric/Behavioral:  Positive for depression and substance abuse. Negative for hallucinations, memory loss and suicidal ideas. The patient is nervous/anxious and has insomnia.    Blood pressure (!) 141/71, pulse 69, temperature 98.9 F (37.2 C), temperature source Oral, resp. rate 18, height 5\' 4"  (1.626 m), weight 65.8 kg, SpO2 100 %. Body mass index is 24.89 kg/m.   Treatment Plan Summary: PLAN Safety and Monitoring: Voluntary admission to inpatient psychiatric unit for safety, stabilization and treatment Daily contact with patient to assess  and evaluate symptoms and progress in treatment Patient's case to be discussed in multi-disciplinary team meeting Observation Level : q15 minute checks Vital signs: q12 hours Precautions: Safety   Long Term Goal(s): Improvement in symptoms so as ready for discharge   Short Term Goals: Ability to identify changes in lifestyle to reduce recurrence of condition will improve, Ability to verbalize feelings will improve, Ability to disclose and discuss suicidal ideas, Ability to demonstrate self-control will improve, Ability to identify and develop effective coping behaviors will improve, Ability to maintain clinical measurements within normal limits will improve, Compliance with prescribed medications will improve, and Ability to identify triggers associated with substance abuse/mental health issues will improve   Diagnoses:  Principal Problem:   MDD (major depressive disorder), recurrent severe, without psychosis (HCC) Active Problems:   Alcohol use disorder   GAD (generalized anxiety disorder)   GERD (gastroesophageal reflux disease)   Medications -Continue Gabapentin to 300 mg TID for ETOH use d/o & GAD -Increase Cymbalta from 60 mg to 90 mg daily for depressive symptoms on 5/24. -Give additional 30 mg of Cymbalta today, 04/10/23 -Start Senna-Colace BID for constipation -Continue Librium taper for alcohol use disorder -Continue trazodone 100 mg nightly for sleep, and continue trazodone 50 mg as needed nightly for sleep -Continue hydroxyzine 50 mg 3 times daily as needed for anxiety -Continue Protonix 40 mg daily for GERD   Patient  has been educated on rationales, benefits, and possible side effects of all medications as listed above.  She is agreeable to taking medications.   Other PRNS -Continue Tylenol 650 mg every 6 hours PRN for mild pain -Continue Maalox 30 mg every 4 hrs PRN for indigestion -Continue Imodium 2-4 mg as needed for diarrhea -Continue Milk of Magnesia as needed every  6 hrs for constipation -Continue Zofran disintegrating tabs every 8 hrs for nausea (scheduled)   Discharge Planning: Social work and case management to assist with discharge planning and identification of hospital follow-up needs prior to discharge Estimated LOS: 5-7 days Discharge Concerns: Need to establish a safety plan; Medication compliance and effectiveness Discharge Goals: Return home with outpatient referrals for mental health follow-up including medication management/psychotherapy   I certify that inpatient services furnished can reasonably be expected to improve the patient's condition.      Starleen Blue, NP 04/10/2023, 3:02 PMPatient ID: Tracy Phillips, female   DOB: February 08, 1966, 57 y.o.   MRN: [id number] Patient ID: Tracy Phillips, female   DOB: [birth year], 57 y.o.   MRN: [id number]

## 2023-04-10 NOTE — Group Note (Signed)
Date:  04/10/2023 Time:  12:34 PM  Group Topic/Focus:  Emotional Education:   The focus of this group is to discuss what feelings/emotions are, and how they are experienced. Self Care:   The focus of this group is to help patients understand the importance of self-care in order to improve or restore emotional, physical, spiritual, interpersonal, and financial health.    Participation Level:  Active  Participation Quality:  Appropriate  Affect:  Appropriate  Cognitive:  Appropriate  Insight: Appropriate  Engagement in Group:  Engaged and Supportive  Modes of Intervention:  Discussion, Exploration, Socialization, and Support  Additional Comments:    Memory Dance Kache Mcclurg 04/10/2023, 12:34 PM

## 2023-04-10 NOTE — Progress Notes (Signed)
   04/09/23 2300  Psych Admission Type (Psych Patients Only)  Admission Status Voluntary  Psychosocial Assessment  Patient Complaints Anxiety;Depression  Eye Contact Fair  Facial Expression Blank  Affect Appropriate to circumstance  Speech Logical/coherent  Interaction Needy  Motor Activity Slow  Appearance/Hygiene Unremarkable  Behavior Characteristics Cooperative  Mood Anxious  Aggressive Behavior  Effect No apparent injury  Thought Process  Coherency Circumstantial  Content WDL  Delusions None reported or observed  Perception WDL  Hallucination None reported or observed  Judgment Poor  Confusion None  Danger to Self  Current suicidal ideation? Denies  Self-Injurious Behavior No self-injurious ideation or behavior indicators observed or expressed   Agreement Not to Harm Self Yes  Description of Agreement Verbal contract  Danger to Others  Danger to Others None reported or observed

## 2023-04-10 NOTE — [facility] Group Notes (Signed)
The focus of this group is to help patients establish daily goals to achieve during treatment and discuss how the patient can incorporate goal setting into their daily lives to aide in recovery.    1-10 2 out of 10    Goal: Work on having a better attitude

## 2023-04-10 NOTE — Progress Notes (Signed)
Adult Psychoeducational Group Note  Date:  04/10/2023 Time:  9:28 PM  Group Topic/Focus:  Wrap-Up Group:   The focus of this group is to help patients review their daily goal of treatment and discuss progress on daily workbooks.  Participation Level:  Active  Participation Quality:  Appropriate  Affect:  Appropriate  Cognitive:  Appropriate  Insight: Appropriate  Engagement in Group:  Engaged  Modes of Intervention:  Discussion  Additional Comments:  Pt stated her day was  okay.  Pt stated her goal was to attend group activities.  Pt met goal.  Lucilla Lame 04/10/2023, 9:28 PM

## 2023-04-10 NOTE — Group Note (Signed)
Occupational Therapy Group Note  Group Topic: Sleep Hygiene  Group Date: 04/10/2023 Start Time: 1435 End Time: 1505 Facilitators: Mariyanna Mucha G, OT   Group Description: Group encouraged increased participation and engagement through topic focused on sleep hygiene. Patients reflected on the quality of sleep they typically receive and identified areas that need improvement. Group was given background information on sleep and sleep hygiene, including common sleep disorders. Group members also received information on how to improve one's sleep and introduced a sleep diary as a tool that can be utilized to track sleep quality over a length of time. Group session ended with patients identifying one or more strategies they could utilize or implement into their sleep routine in order to improve overall sleep quality.        Therapeutic Goal(s):  Identify one or more strategies to improve overall sleep hygiene  Identify one or more areas of sleep that are negatively impacted (sleep too much, too little, etc)     Participation Level: Engaged   Participation Quality: Independent   Behavior: Appropriate   Speech/Thought Process: Relevant   Affect/Mood: Appropriate   Insight: Fair   Judgement: Fair      Modes of Intervention: Education  Patient Response to Interventions:  Attentive   Plan: Continue to engage patient in OT groups 2 - 3x/week.  04/10/2023  Makaria Poarch G Serra Younan, OT   Tasmin Exantus, OT  

## 2023-04-11 ENCOUNTER — Encounter (HOSPITAL_COMMUNITY): Payer: Self-pay

## 2023-04-11 DIAGNOSIS — F332 Major depressive disorder, recurrent severe without psychotic features: Secondary | ICD-10-CM

## 2023-04-11 MED ORDER — ONDANSETRON HCL 4 MG PO TABS
4.0000 mg | ORAL_TABLET | Freq: Two times a day (BID) | ORAL | Status: DC
  Administered 2023-04-11 – 2023-04-13 (×4): 4 mg via ORAL
  Filled 2023-04-11 (×8): qty 1

## 2023-04-11 NOTE — Group Note (Signed)
Date:  04/11/2023 Time:  11:09 AM  Group Topic/Focus:  Orientation:   The focus of this group is to educate the patient on the purpose and policies of crisis stabilization and provide a format to answer questions about their admission.  The group details unit policies and expectations of patients while admitted.    Participation Level:  Active  Participation Quality:  Appropriate  Affect:  Appropriate  Cognitive:  Appropriate  Insight: Appropriate  Engagement in Group:  Engaged  Modes of Intervention:  Discussion  Additional Comments:     Reymundo Poll 04/11/2023, 11:09 AM

## 2023-04-11 NOTE — Group Note (Signed)
Date:  04/11/2023 Time:  10:47 PM  Group Topic/Focus:  AA    Participation Level: Did Not Attend   Scot Dock 04/11/2023, 10:47 PM

## 2023-04-11 NOTE — Progress Notes (Signed)
Tracy Phillips had requested to meet with chaplain and made an appointment time for 1:45.  At the time though, Tracy Phillips stated that she was not up to talking.  Chaplain will plan to follow up on Monday.

## 2023-04-11 NOTE — BH IP Treatment Plan (Signed)
Interdisciplinary Treatment and Diagnostic Plan Update  04/11/2023 Time of Session: 0830 Tracy Phillips MRN: [id number]  Principal Diagnosis: MDD (major depressive disorder), recurrent severe, without psychosis (HCC)  Secondary Diagnoses: Principal Problem:   MDD (major depressive disorder), recurrent severe, without psychosis (HCC) Active Problems:   Alcohol use disorder   GAD (generalized anxiety disorder)   GERD (gastroesophageal reflux disease)   Current Medications:  Current Facility-Administered Medications  Medication Dose Route Frequency Provider Last Rate Last Admin   acetaminophen (TYLENOL) tablet 650 mg  650 mg Oral Q6H PRN Bennett, Christal H, NP   650 mg at 04/08/23 0756   alum & mag hydroxide-simeth (MAALOX/MYLANTA) 200-200-20 MG/5ML suspension 30 mL  30 mL Oral Q4H PRN Bennett, Christal H, NP       diphenhydrAMINE (BENADRYL) capsule 50 mg  50 mg Oral TID PRN Willeen Cass, Christal H, NP   50 mg at 04/07/23 0454   Or   diphenhydrAMINE (BENADRYL) injection 50 mg  50 mg Intramuscular TID PRN Bennett, Christal H, NP       DULoxetine (CYMBALTA) DR capsule 90 mg  90 mg Oral Daily Starleen Blue, NP   90 mg at 04/11/23 0841   folic acid (FOLVITE) tablet 1 mg  1 mg Oral Daily Bennett, Christal H, NP   1 mg at 04/11/23 4098   gabapentin (NEURONTIN) capsule 300 mg  300 mg Oral TID Starleen Blue, NP   300 mg at 04/11/23 1500   haloperidol (HALDOL) tablet 5 mg  5 mg Oral TID PRN Thurston Hole H, NP   5 mg at 04/07/23 0455   Or   haloperidol lactate (HALDOL) injection 5 mg  5 mg Intramuscular TID PRN Bennett, Christal H, NP       hydrOXYzine (ATARAX) tablet 50 mg  50 mg Oral TID PRN Starleen Blue, NP   50 mg at 04/11/23 1503   magnesium hydroxide (MILK OF MAGNESIA) suspension 30 mL  30 mL Oral Daily PRN Bennett, Christal H, NP       multivitamin with minerals tablet 1 tablet  1 tablet Oral Daily Bennett, Christal H, NP   1 tablet at 04/11/23 0842   ondansetron (ZOFRAN) tablet 4  mg  4 mg Oral Q12H Leevy-Johnson, Brooke A, NP       pantoprazole (PROTONIX) EC tablet 40 mg  40 mg Oral Daily Starleen Blue, NP   40 mg at 04/11/23 0841   senna-docusate (Senokot-S) tablet 1 tablet  1 tablet Oral BID Starleen Blue, NP   1 tablet at 04/11/23 1191   thiamine (Vitamin B-1) tablet 100 mg  100 mg Oral Daily Bennett, Christal H, NP   100 mg at 04/11/23 4782   traZODone (DESYREL) tablet 100 mg  100 mg Oral QHS Starleen Blue, NP   100 mg at 04/10/23 2133   traZODone (DESYREL) tablet 50 mg  50 mg Oral QHS PRN Willeen Cass, Christal H, NP   50 mg at 04/06/23 2146   PTA Medications: Medications Prior to Admission  Medication Sig Dispense Refill Last Dose   acyclovir (ZOVIRAX) 400 MG tablet TAKE 1 TABLET BY MOUTH TWICE A DAY 180 tablet 1    [EXPIRED] chlordiazePOXIDE (LIBRIUM) 25 MG capsule Take 1 capsule (25 mg total) by mouth 3 (three) times daily for 3 days. 9 capsule 0    folic acid (FOLVITE) 1 MG tablet Take 1 tablet (1 mg total) by mouth daily.      LORazepam (ATIVAN) 1 MG tablet Take 1-4 tablets (1-4 mg total) by  mouth every hour as needed (withdrawal symptoms:  anxiety, agitation, insomnia, diaphoresis, nausea, vomiting, tremors, tachycardia, or hypertension.). 20 tablet 0    thiamine (VITAMIN B-1) 100 MG tablet Take 1 tablet (100 mg total) by mouth daily.      traZODone (DESYREL) 50 MG tablet TAKE 1-2 TABLETS EVERY NIGHT 180 tablet 1    venlafaxine XR (EFFEXOR-XR) 75 MG 24 hr capsule TAKE 1 CAPSULE BY MOUTH DAILY WITH BREAKFAST. 90 capsule 1     Patient Stressors: Loss of father   Substance abuse   Traumatic event    Patient Strengths: Average or above average intelligence  Capable of independent living  Communication skills  Supportive family/friends   Treatment Modalities: Medication Management, Group therapy, Case management,  1 to 1 session with clinician, Psychoeducation, Recreational therapy.   Physician Treatment Plan for Primary Diagnosis: MDD (major depressive  disorder), recurrent severe, without psychosis (HCC) Long Term Goal(s): Improvement in symptoms so as ready for discharge   Short Term Goals: Ability to identify changes in lifestyle to reduce recurrence of condition will improve Ability to verbalize feelings will improve Ability to disclose and discuss suicidal ideas Ability to demonstrate self-control will improve Ability to identify and develop effective coping behaviors will improve Ability to maintain clinical measurements within normal limits will improve Compliance with prescribed medications will improve Ability to identify triggers associated with substance abuse/mental health issues will improve  Medication Management: Evaluate patient's response, side effects, and tolerance of medication regimen.  Therapeutic Interventions: 1 to 1 sessions, Unit Group sessions and Medication administration.  Evaluation of Outcomes: Progressing  Physician Treatment Plan for Secondary Diagnosis: Principal Problem:   MDD (major depressive disorder), recurrent severe, without psychosis (HCC) Active Problems:   Alcohol use disorder   GAD (generalized anxiety disorder)   GERD (gastroesophageal reflux disease)  Long Term Goal(s): Improvement in symptoms so as ready for discharge   Short Term Goals: Ability to identify changes in lifestyle to reduce recurrence of condition will improve Ability to verbalize feelings will improve Ability to disclose and discuss suicidal ideas Ability to demonstrate self-control will improve Ability to identify and develop effective coping behaviors will improve Ability to maintain clinical measurements within normal limits will improve Compliance with prescribed medications will improve Ability to identify triggers associated with substance abuse/mental health issues will improve     Medication Management: Evaluate patient's response, side effects, and tolerance of medication regimen.  Therapeutic  Interventions: 1 to 1 sessions, Unit Group sessions and Medication administration.  Evaluation of Outcomes: Progressing   RN Treatment Plan for Primary Diagnosis: MDD (major depressive disorder), recurrent severe, without psychosis (HCC) Long Term Goal(s): Knowledge of disease and therapeutic regimen to maintain health will improve  Short Term Goals: Ability to remain free from injury will improve, Ability to verbalize frustration and anger appropriately will improve, Ability to demonstrate self-control, Ability to participate in decision making will improve, Ability to verbalize feelings will improve, Ability to disclose and discuss suicidal ideas, Ability to identify and develop effective coping behaviors will improve, and Compliance with prescribed medications will improve  Medication Management: RN will administer medications as ordered by provider, will assess and evaluate patient's response and provide education to patient for prescribed medication. RN will report any adverse and/or side effects to prescribing provider.  Therapeutic Interventions: 1 on 1 counseling sessions, Psychoeducation, Medication administration, Evaluate responses to treatment, Monitor vital signs and CBGs as ordered, Perform/monitor CIWA, COWS, AIMS and Fall Risk screenings as ordered, Perform wound care treatments as  ordered.  Evaluation of Outcomes: Progressing   LCSW Treatment Plan for Primary Diagnosis: MDD (major depressive disorder), recurrent severe, without psychosis (HCC) Long Term Goal(s): Safe transition to appropriate next level of care at discharge, Engage patient in therapeutic group addressing interpersonal concerns.  Short Term Goals: Engage patient in aftercare planning with referrals and resources, Increase social support, Increase ability to appropriately verbalize feelings, Increase emotional regulation, Facilitate acceptance of mental health diagnosis and concerns, Facilitate patient progression  through stages of change regarding substance use diagnoses and concerns, Identify triggers associated with mental health/substance abuse issues, and Increase skills for wellness and recovery  Therapeutic Interventions: Assess for all discharge needs, 1 to 1 time with Social worker, Explore available resources and support systems, Assess for adequacy in community support network, Educate family and significant other(s) on suicide prevention, Complete Psychosocial Assessment, Interpersonal group therapy.  Evaluation of Outcomes: Progressing   Progress in Treatment: Attending groups: Yes. Participating in groups: Yes. Taking medication as prescribed: Yes. Toleration medication: Yes. Family/Significant other contact made: Yes, individual(s) contacted:  Tressa Busman (mom) [phone] Patient understands diagnosis: Yes. Discussing patient identified problems/goals with staff: Yes. Medical problems stabilized or resolved: Yes. Denies suicidal/homicidal ideation: No. Issues/concerns per patient self-inventory: Yes. Other: none  New problem(s) identified: No, Describe:  none  New Short Term/Long Term Goal(s): Patient to work towards detox,  medication management for mood stabilization; elimination of SI thoughts; development of comprehensive mental wellness/sobriety plan.   Patient Goals:  No additional goals identified at this time. Patient to continue to work towards original goals identified in initial treatment team meeting. CSW will remain available to patient should they voice additional treatment goals.   Discharge Plan or Barriers: No psychosocial barriers identified at this time, patient to return to place of residence when appropriate for discharge.   Reason for Continuation of Hospitalization: Depression Medication stabilization Suicidal ideation  Estimated Length of Stay: 1-7 days   Last 3 [place] Suicide Severity Risk Score: Flowsheet Row Ad[facility] (Current) [facility]Ad[facility] (Discharged) from [facility]03/2023 [facility]  C-SSRS RISK CATEGORY Moderate Risk Moderate Risk       Last PHQ 2/9 Scores:    12/24/2019    3:38 PM 11/05/2019    1:36 PM 04/26/2019    9:31 AM  Depression screen PHQ 2/9  Decreased Interest 2  2  Down, Depressed, Hopeless 2  2  PHQ - 2 Score 4  4  Altered sleeping 2  2  Tired, decreased energy 2  1  Change in appetite 2  0  Feeling bad or failure about yourself  2  1  Trouble concentrating 0  0  Moving slowly or fidgety/restless 0 0 0  Suicidal thoughts 0 0 0  PHQ-9 Score 12  8  Difficult doing work/chores Somewhat difficult  Not difficult at all    Scribe for Treatment Team: Almedia Balls 04/11/2023 3:09 PM

## 2023-04-11 NOTE — Progress Notes (Signed)
   04/10/23 2030  Psych Admission Type (Psych Patients Only)  Admission Status Voluntary  Psychosocial Assessment  Patient Complaints Anxiety;Depression  Eye Contact Fair  Facial Expression Animated  Affect Appropriate to circumstance  Speech Logical/coherent  Interaction Needy  Motor Activity Other (Comment)  Appearance/Hygiene Unremarkable  Behavior Characteristics Cooperative  Mood Anxious  Thought Process  Coherency Circumstantial  Content WDL  Delusions None reported or observed  Perception WDL  Hallucination None reported or observed  Judgment Poor  Confusion None  Danger to Self  Current suicidal ideation? Denies  Self-Injurious Behavior No self-injurious ideation or behavior indicators observed or expressed   Agreement Not to Harm Self Yes  Description of Agreement Verbal contract

## 2023-04-11 NOTE — Group Note (Deleted)
LCSW Group Therapy Note    Group Date: 04/11/2023 Start Time: 1100 End Time: 1200   Type of Therapy and Topic: Group Therapy: Body Image  Participation Level:  {[facility] PARTICIPATION LEVEL:22264}  Description of Group:  Patients were educated about body image and asked to think about whether they have a healthy or unhealthy body image. Patients were led in a discussion about factors that contribute to body image, both internal and external. Patients were asked to discuss strengths of the human body outside of appearance, such as being able to fight off diseases and provide stress relief. Lastly, patients were asked to identify one way in which they appreciate their own body outside of appearance.   Therapeutic Goals:   1. Patient will differentiate between a healthy and unhealthy body image. 2. Patient will identify what contributes to body image 3. Patient will discuss the strengths of the human body. 4. Patient will identify a positive attribute of their body outside of physical appearance.  Summary of Patient Progress:  *** actively engaged in processing and exploring how they are affected by body image. Patient proved open to input from peers and feedback from CSW. Patient demonstrated *** insight into the subject matter, was respectful and supportive of peers, and participated throughout the entire session.  Therapeutic Modalities: Cognitive Behavioral Therapy; Solution-Focused Therapy  Schwanda Zima B Sravya Grissom, LCSWA 04/11/2023  3:19 PM   

## 2023-04-11 NOTE — Progress Notes (Signed)
[facility] MD Progress Note  04/11/2023 1:12 PM Tracy Phillips  MRN:  [id number] Principal Problem: MDD (major depressive disorder), recurrent severe, without psychosis (HCC) Diagnosis: Principal Problem:   MDD (major depressive disorder), recurrent severe, without psychosis (HCC) Active Problems:   Alcohol use disorder   GAD (generalized anxiety disorder)   GERD (gastroesophageal reflux disease)  Reason for admission: Tracy Phillips is a a 57 yo CF with prior mental health diagnoses of alcohol use d/o, MDD & GAD who presented to the [facility] hospital ER on 5/17 with complaints of worsening depressive symptoms and +SI in the context of alcohol intoxication and psychosocial stressors.  Patient was medically cleared, and transferred voluntarily to this behavioral health Hospital for treatment and stabilization of her mental status.   24 Hr chart Review: Staff report patient patient intermittently visible in milieu attending unit groups and activities. Patient observed in dayroom participating in [place]. Slept 5.25 overnight. Medication compliant; PRN Hydroxyzine 0843. Vital signs WNL. CIWA score 5. Per chart review [facility] and charged with DWI.   Assessment note: On assessment today, patient presents casual appearance. Patient mood now appears depressed mood, pleasant incongruent affect. Patient was observed in milieu throughout the day smiling and interacting with peers and staff. She now reports multiple somatic complaints including 'ongoing' headaches, nausea and vomiting, 'increased' tremor and depression that she attributes to incident between other peer on unit earlier. However patient was observed singing karaoke with peers and playing games. She reports poor appetite and sleep; states poor appetite is related to ongoing nausea/vomiting that she reports is related to 'withdrawals' and is inquiring about restarting Librium taper. When asked about plans post  discharge she reports 'I don't know' then mentions calling her mother who she says is refusing to bring her clothes and feels she is 'over it'. Denies any recent issues and reports close relationship with mother and kids; has one son in [place] currently stationed in [place]. Mentions possibility of mom's pastor talking to her about 'enabling'; says she's had 13 years sober and 2 weeks ago relapsed. Appears to be tearful stating she feels like 'such a failure for reverting back'. She currently rates depression as 9/10 and anxiety 10/10 on a scale of 0-10 with 10 being worse. She becomes tearful during assessment. She has voiced multiple somatic complaints during assessment; mentions 'tingling and numbness' in left hand. Patient endorses suicidal ideations earlier today after verbal altercation between peers on unit, denies any active suicidal/homicidal ideations, auditory/visual hallucinations at the time of assessment. No evidence paranoia, and there is no evidence of delusional thinking. Patient appears hyper-focused on somatic symptoms with incongruent presentation and medications asking for increased Zofran, Hydroxyzine, and restarting Librium. Staff deny any patient reports or witnessing any of her reported symptoms today.   Around 1400, MHT approached provider stating they walked into patient's room and observed her in on what they believed to be inducing vomit by putting her hands in her mouth. Due to concern for possible secondary gain, Ondansetron order changed to 4 mg BID PRN; pt does have history of eating disorder per chart review. No medication changes noted at this time as she is scheduled to remain on Gabapentin 300 mg TID increased yesterday for alcohol use d/o & GAD, Cymbalta 90 mg daily 1st dose today for management of depressive symptoms and anxiety.  Librium taper has been completed. Discharge date noted for this patient is Monday May 27; LCSW continues to follow.   Total  Time spent with  patient: 45 minutes  Past Psychiatric History: See H & P  Past Medical History:  Past Medical History:  Diagnosis Date   Depression    H/O dizziness    H/O eating disorder    H/O: alcohol abuse    Herpes    H/O   History of chicken pox    History of colon polyps    Hyperlipidemia    Hypertension    Kidney stones    H/O stones    Past Surgical History:  Procedure Laterality Date   ABDOMINAL HYSTERECTOMY  1998   APPENDECTOMY  1997   DILATION AND CURETTAGE OF UTERUS     TONSILLECTOMY  1997   Family History:  Family History  Problem Relation Age of Onset   Hyperlipidemia Mother    Hypertension Mother    Breast cancer Mother 32   Arthritis Father    Hyperlipidemia Father    Hypertension Father    Diabetes Father    Hypertension Brother    Arthritis Maternal Aunt    Hyperlipidemia Maternal Aunt    Hypertension Maternal Aunt    Alcohol abuse Maternal Uncle    Arthritis Maternal Uncle    Hyperlipidemia Maternal Uncle    Hypertension Maternal Uncle    Arthritis Paternal Aunt    Hyperlipidemia Paternal Aunt    Hypertension Paternal Aunt    Arthritis Paternal Uncle    Hyperlipidemia Paternal Uncle    Hypertension Paternal Uncle    Arthritis Maternal Grandmother    Hyperlipidemia Maternal Grandmother    Hypertension Maternal Grandmother    Arthritis Maternal Grandfather    Lung cancer Maternal Grandfather    Prostate cancer Maternal Grandfather    Hyperlipidemia Maternal Grandfather    Stroke Maternal Grandfather    Hypertension Maternal Grandfather    Arthritis Paternal Grandmother    Hyperlipidemia Paternal Grandmother    Hypertension Paternal Grandmother    Alcohol abuse Paternal Grandfather    Arthritis Paternal Grandfather    Hyperlipidemia Paternal Grandfather    Hypertension Paternal Grandfather    Sudden death Paternal Grandfather    Family Psychiatric  History: See H & P Social History:  Social History   Substance and Sexual Activity  Alcohol Use  No   Comment: sober since 10/27/2010     Social History   Substance and Sexual Activity  Drug Use No    Social History   Socioeconomic History   Marital status: Single    Spouse name: Not on file   Number of children: Not on file   Years of education: Not on file   Highest education level: Not on file  Occupational History   Not on file  Tobacco Use   Smoking status: Never   Smokeless tobacco: Never  Vaping Use   Vaping Use: Never used  Substance and Sexual Activity   Alcohol use: No    Comment: sober since 10/27/2010   Drug use: No   Sexual activity: Not on file  Other Topics Concern   Not on file  Social History Narrative   Not on file   Social Determinants of Health   Financial Resource Strain: Not on file  Food Insecurity: No Food Insecurity (04/06/2023)   Hunger Vital Sign    Worried About Running Out of Food in the Last Year: Never true    Ran Out of Food in the Last Year: Never true  Transportation Needs: No Transportation Needs (04/06/2023)   PRAPARE - Transportation    Lack  of Transportation (Medical): No    Lack of Transportation (Non-Medical): No  Physical Activity: Not on file  Stress: Not on file  Social Connections: Not on file   Sleep: Poor  Appetite:  Poor  Current Medications: Current Facility-Administered Medications  Medication Dose Route Frequency Provider Last Rate Last Admin   acetaminophen (TYLENOL) tablet 650 mg  650 mg Oral Q6H PRN Bennett, Christal H, NP   650 mg at 04/08/23 0756   alum & mag hydroxide-simeth (MAALOX/MYLANTA) 200-200-20 MG/5ML suspension 30 mL  30 mL Oral Q4H PRN Bennett, Christal H, NP       diphenhydrAMINE (BENADRYL) capsule 50 mg  50 mg Oral TID PRN Bennett, Christal H, NP   50 mg at 04/07/23 0454   Or   diphenhydrAMINE (BENADRYL) injection 50 mg  50 mg Intramuscular TID PRN Bennett, Christal H, NP       DULoxetine (CYMBALTA) DR capsule 90 mg  90 mg Oral Daily Nkwenti, Doris, NP   90 mg at 04/11/23 0841   folic  acid (FOLVITE) tablet 1 mg  1 mg Oral Daily Bennett, Christal H, NP   1 mg at 04/11/23 7829   gabapentin (NEURONTIN) capsule 300 mg  300 mg Oral TID Starleen Blue, NP   300 mg at 04/11/23 5621   haloperidol (HALDOL) tablet 5 mg  5 mg Oral TID PRN Thurston Hole H, NP   5 mg at 04/07/23 0455   Or   haloperidol lactate (HALDOL) injection 5 mg  5 mg Intramuscular TID PRN Bennett, Christal H, NP       hydrOXYzine (ATARAX) tablet 50 mg  50 mg Oral TID PRN Starleen Blue, NP   50 mg at 04/11/23 0843   magnesium hydroxide (MILK OF MAGNESIA) suspension 30 mL  30 mL Oral Daily PRN Bennett, Christal H, NP       multivitamin with minerals tablet 1 tablet  1 tablet Oral Daily Bennett, Christal H, NP   1 tablet at 04/11/23 0842   ondansetron (ZOFRAN) tablet 4 mg  4 mg Oral QID Starleen Blue, NP   4 mg at 04/11/23 1152   pantoprazole (PROTONIX) EC tablet 40 mg  40 mg Oral Daily Nkwenti, Tyler Aas, NP   40 mg at 04/11/23 0841   senna-docusate (Senokot-S) tablet 1 tablet  1 tablet Oral BID Starleen Blue, NP   1 tablet at 04/11/23 3086   thiamine (Vitamin B-1) tablet 100 mg  100 mg Oral Daily Bennett, Christal H, NP   100 mg at 04/11/23 0842   traZODone (DESYREL) tablet 100 mg  100 mg Oral QHS Nkwenti, Doris, NP   100 mg at 04/10/23 2133   traZODone (DESYREL) tablet 50 mg  50 mg Oral QHS PRN Willeen Cass, Christal H, NP   50 mg at 04/06/23 2146    Lab Results:  No results found for this or any previous visit (from the past 48 hour(s)).   Blood Alcohol level:  Lab Results  Component Value Date   ETH 307 [facility]) 04/03/2023    Metabolic Disorder Labs: Lab Results  Component Value Date   HGBA1C 5.7 (H) 04/08/2023   MPG 116.89 04/08/2023   No results found for: "PROLACTIN" Lab Results  Component Value Date   CHOL 226 (H) 04/08/2023   TRIG 149 04/08/2023   HDL 59 04/08/2023   CHOLHDL 3.8 04/08/2023   VLDL 30 04/08/2023   LDLCALC 137 (H) 04/08/2023    Physical Findings: AIMS:  , ,  ,  ,  CIWA:   CIWA-Ar Total: 5 COWS:     Musculoskeletal: Strength & Muscle Tone: within normal limits Gait & Station: normal Patient leans: N/A  Psychiatric Specialty Exam:  Presentation  General Appearance:  Casual  Eye Contact: Fair  Speech: Clear and Coherent  Speech Volume: Normal  Handedness: Right   Mood and Affect  Mood: Dysphoric  Affect: Congruent   Thought Process  Thought Processes: Coherent  Descriptions of Associations:Intact  Orientation:Full (Time, Place and Person)  Thought Content:Logical  History of Schizophrenia/Schizoaffective disorder:No  Duration of Psychotic Symptoms:No data recorded Hallucinations:Hallucinations: None  Ideas of Reference:None  Suicidal Thoughts:Suicidal Thoughts: No SI Active Intent and/or Plan: Without Intent; Without Plan  Homicidal Thoughts:Homicidal Thoughts: No   Sensorium  Memory: Immediate Fair; Recent Fair  Judgment: Fair  Insight: Fair   Chartered certified accountant: Fair  Attention Span: Fair  Recall: [employer] of Knowledge: Fair  Language: Fair   Psychomotor Activity  Psychomotor Activity: Psychomotor Activity: Normal   Assets  Assets: Manufacturing systems engineer; Resilience   Sleep  Sleep: Sleep: Good    Physical Exam: Physical Exam Constitutional:      Appearance: Normal appearance.  HENT:     Head: Normocephalic.  Eyes:     Pupils: Pupils are equal, round, and reactive to light.  Musculoskeletal:        General: Normal range of motion.     Cervical back: Normal range of motion.  Neurological:     Mental Status: She is alert and oriented to person, place, and time.  Psychiatric:        Attention and Perception: She does not perceive auditory or visual hallucinations.        Mood and Affect: Affect is inappropriate.        Behavior: Behavior is cooperative.        Thought Content: Thought content is not paranoid or delusional. Thought content does not include  homicidal or suicidal ideation. Thought content does not include homicidal or suicidal plan.        Cognition and Memory: Cognition and memory normal.        Judgment: Judgment is impulsive.    Review of Systems  Constitutional:  Negative for fever.  HENT:  Negative for hearing loss.   Eyes:  Negative for blurred vision.  Respiratory:  Negative for cough.   Cardiovascular:  Negative for chest pain.  Gastrointestinal:  Negative for heartburn.  Genitourinary:  Negative for dysuria.  Musculoskeletal:  Negative for myalgias.  Neurological:  Negative for dizziness.  Psychiatric/Behavioral:  Positive for depression and substance abuse. Negative for hallucinations, memory loss and suicidal ideas. The patient is nervous/anxious and has insomnia.    Blood pressure 138/78, pulse 69, temperature 98.7 F (37.1 C), temperature source Oral, resp. rate 18, height 5\' 4"  (1.626 m), weight 65.8 kg, SpO2 99 %. Body mass index is 24.89 kg/m.   Treatment Plan Summary: PLAN Safety and Monitoring: Voluntary admission to inpatient psychiatric unit for safety, stabilization and treatment Daily contact with patient to assess and evaluate symptoms and progress in treatment Patient's case to be discussed in multi-disciplinary team meeting Observation Level : q15 minute checks Vital signs: q12 hours Precautions: Safety   Long Term Goal(s): Improvement in symptoms so as ready for discharge   Short Term Goals: Ability to identify changes in lifestyle to reduce recurrence of condition will improve, Ability to verbalize feelings will improve, Ability to disclose and discuss suicidal ideas, Ability to demonstrate self-control will improve, Ability to  identify and develop effective coping behaviors will improve, Ability to maintain clinical measurements within normal limits will improve, Compliance with prescribed medications will improve, and Ability to identify triggers associated with substance abuse/mental health  issues will improve   Diagnoses:  Principal Problem:   MDD (major depressive disorder), recurrent severe, without psychosis (HCC) Active Problems:   Alcohol use disorder   GAD (generalized anxiety disorder)   GERD (gastroesophageal reflux disease)  Recent Medication Trials:  -Librium taper for alcohol use disorder (Librium 25 mg daily) last drink 04/03/23 -Cymbalta 60 mg; increased to 90 mg   Medications - Gabapentin to 300 mg TID for ETOH use d/o & GAD -Cymbalta from 60 mg daily for depressive symptoms on 5/24. -Give additional 30 mg of Cymbalta today, 04/10/23 -Senna-Colace BID for constipation -Trazodone 100 mg nightly for sleep, and continue trazodone 50 mg as needed nightly for sleep -Protonix 40 mg daily for GERD   Patient has been educated on rationales, benefits, and possible side effects of all medications as listed above.  She is agreeable to taking medications.   Other PRNS - Tylenol 650 mg every 6 hours PRN for mild pain - Maalox 30 mg every 4 hrs PRN for indigestion - Imodium 2-4 mg as needed for diarrhea - Milk of Magnesia as needed every 6 hrs for constipation - Zofran 4 mg disintegrating tabs every 12 hrs for nausea (scheduled); adjusted  - Hydroxyzine 50 mg 3 times daily as needed for anxiety   Discharge Planning: Social work and case management to assist with discharge planning and identification of hospital follow-up needs prior to discharge Estimated LOS: 5-7 days Discharge Concerns: Need to establish a safety plan; Medication compliance and effectiveness Discharge Goals: Return home with outpatient referrals for mental health follow-up including medication management/psychotherapy   I certify that inpatient services furnished can reasonably be expected to improve the patient's condition.      Loletta Parish, NP 04/11/2023, 1:12 PM  Patient ID: Tracy Phillips, female   DOB: 13-Mar-1966, 57 y.o.   MRN: [id number]

## 2023-04-11 NOTE — Progress Notes (Signed)
   04/11/23 0615  15 Minute Checks  Location Dayroom  Visual Appearance Calm  Behavior Composed  Sleep (Behavioral Health Patients Only)  Calculate sleep? (Click Yes once per 24 hr at 0600 safety check) Yes  Documented sleep last 24 hours 5.25

## 2023-04-11 NOTE — Progress Notes (Signed)
Pt endorses increased anxiety. Pt reports vomiting after breakfast and lunch. MHT reported seeing pt bent over toilet with hands near face vomiting. Pt denies self induced vomiting. History of eating disorder noted. Pt reports withdrawal symptoms including anxiety, nausea, and sensitivity to lights and sounds. Hydroxyzine administered prn. Q 15 minute checks ongoing.

## 2023-04-11 NOTE — Plan of Care (Signed)
  Problem: Education: Goal: Knowledge of General Education information will improve Description: Including pain rating scale, medication(s)/side effects and non-pharmacologic comfort measures Outcome: Progressing   Problem: Coping: Goal: Level of anxiety will decrease Outcome: Progressing   Problem: Education: Goal: Knowledge of the prescribed therapeutic regimen will improve Outcome: Progressing

## 2023-04-12 MED ORDER — NAPHAZOLINE-GLYCERIN 0.012-0.25 % OP SOLN
1.0000 [drp] | Freq: Two times a day (BID) | OPHTHALMIC | Status: DC | PRN
  Administered 2023-04-12 – 2023-04-13 (×5): 2 [drp] via OPHTHALMIC
  Filled 2023-04-12: qty 15

## 2023-04-12 NOTE — Group Note (Signed)
Date:  04/12/2023 Time:  12:46 PM  Group Topic/Focus:  Goals Group:   The focus of this group is to help patients establish daily goals to achieve during treatment and discuss how the patient can incorporate goal setting into their daily lives to aide in recovery. Orientation:   The focus of this group is to educate the patient on the purpose and policies of crisis stabilization and provide a format to answer questions about their admission.  The group details unit policies and expectations of patients while admitted.    Participation Level:  Active  Participation Quality:  Appropriate  Affect:  Appropriate  Cognitive:  Appropriate  Insight: Appropriate  Engagement in Group:  Engaged  Modes of Intervention:  Activity, Discussion, and Orientation  Additional Comments:   Pt attended and participated in the Orientation/Goals group.  [name] Fama Muenchow 04/12/2023, 12:46 PM

## 2023-04-12 NOTE — Progress Notes (Signed)
[facility] MD Progress Note  04/12/2023 1:14 PM GRETHEL WILTS  MRN:  [id number] Principal Problem: MDD (major depressive disorder), recurrent severe, without psychosis (HCC) Diagnosis: Principal Problem:   MDD (major depressive disorder), recurrent severe, without psychosis (HCC) Active Problems:   Alcohol use disorder   GAD (generalized anxiety disorder)   GERD (gastroesophageal reflux disease)  Reason for admission: [name] is a a 57 yo CF with prior mental health diagnoses of alcohol use d/o, MDD & GAD who presented to the [facility] hospital ER on 5/17 with complaints of worsening depressive symptoms and +SI in the context of alcohol intoxication and psychosocial stressors.  Patient was medically cleared, and transferred voluntarily to this behavioral health Hospital for treatment and stabilization of her mental status.   24 Hr chart Review: Sleep Hours last night: Not documented, but pt reports that her sleep quality last night was good. Nursing Concerns: Patient observed yesterday as per nursing staff to be self inducing vomiting. Interacting appropriately in the day room with her, peers, laughing and making jokes as per nursing staff. Medication Compliance: Compliant Vital Signs in the past 24 hrs: WNL PRN Medications in the past 24 hrs: Hydroxyzine.  Assessment note: Pt presents today with a significantly less depressed mood as compared to two days ago when Clinical research associate last saw her. Her attention to personal hygiene and grooming is fair, eye contact is good, speech is clear & coherent. Thought contents are organized and logical, and pt currently denies SI/HI/AVH or paranoia. There is no evidence of delusional thoughts.    Patient reports that her sleep quality last night was good, she reports that her appetite is fair and improving, and reports that her nausea is resolving.  Writer asked her about a history of an eating disorder, and she denies it.  Writer also confronted patient about  the self-induced vomiting that happened yesterday, and patient again denies it.  Writer educated patient that we will keep her Zofran at twice daily as needed, and she verbalized understanding.  Patient asking for eyedrops for allergies, and writer agreed to order this medication for her.  Patient denies medication related side effects, reports that she has had many small bowel movements, but feels like she has not completely evacuated her bowels yet.  Writer educated patient that they senna Colace combo will be changed to Colace alone for constipation, and she verbalizes understanding.  She reports a mild headache, but states that Tylenol is helpful.    Patient however continues to complain of her anxiety being a 7, 10 being worst.  She reports that gabapentin is helping with anxiety, but continues to report an anxiety of 9, 10 being worst.  Patient is presenting with multiple somatic complaints, and there are concerns that she might be over reporting her symptoms in an effort to get more medications.  We will keep medications the same as yesterday, as listed below.  Patient has been educated that our plan is to discharge her either Monday or Tuesday as long as there are outpatient follow-up appointments for continuity of care outside of this hospital.  She verbalizes understanding and is agreeable.  Total Time spent with patient: 45 minutes  Past Psychiatric History: See H & P  Past Medical History:  Past Medical History:  Diagnosis Date   Depression    H/O dizziness    H/O eating disorder    H/O: alcohol abuse    Herpes    H/O   History of chicken pox  History of colon polyps    Hyperlipidemia    Hypertension    Kidney stones    H/O stones    Past Surgical History:  Procedure Laterality Date   ABDOMINAL HYSTERECTOMY  1998   APPENDECTOMY  1997   DILATION AND CURETTAGE OF UTERUS     TONSILLECTOMY  1997   Family History:  Family History  Problem Relation Age of Onset    Hyperlipidemia Mother    Hypertension Mother    Breast cancer Mother 21   Arthritis Father    Hyperlipidemia Father    Hypertension Father    Diabetes Father    Hypertension Brother    Arthritis Maternal Aunt    Hyperlipidemia Maternal Aunt    Hypertension Maternal Aunt    Alcohol abuse Maternal Uncle    Arthritis Maternal Uncle    Hyperlipidemia Maternal Uncle    Hypertension Maternal Uncle    Arthritis Paternal Aunt    Hyperlipidemia Paternal Aunt    Hypertension Paternal Aunt    Arthritis Paternal Uncle    Hyperlipidemia Paternal Uncle    Hypertension Paternal Uncle    Arthritis Maternal Grandmother    Hyperlipidemia Maternal Grandmother    Hypertension Maternal Grandmother    Arthritis Maternal Grandfather    Lung cancer Maternal Grandfather    Prostate cancer Maternal Grandfather    Hyperlipidemia Maternal Grandfather    Stroke Maternal Grandfather    Hypertension Maternal Grandfather    Arthritis Paternal Grandmother    Hyperlipidemia Paternal Grandmother    Hypertension Paternal Grandmother    Alcohol abuse Paternal Grandfather    Arthritis Paternal Grandfather    Hyperlipidemia Paternal Grandfather    Hypertension Paternal Grandfather    Sudden death Paternal Grandfather    Family Psychiatric  History: See H & P Social History:  Social History   Substance and Sexual Activity  Alcohol Use No   Comment: sober since 10/27/2010     Social History   Substance and Sexual Activity  Drug Use No    Social History   Socioeconomic History   Marital status: Single    Spouse name: Not on file   Number of children: Not on file   Years of education: Not on file   Highest education level: Not on file  Occupational History   Not on file  Tobacco Use   Smoking status: Never   Smokeless tobacco: Never  Vaping Use   Vaping Use: Never used  Substance and Sexual Activity   Alcohol use: No    Comment: sober since 10/27/2010   Drug use: No   Sexual activity: Not  on file  Other Topics Concern   Not on file  Social History Narrative   Not on file   Social Determinants of Health   Financial Resource Strain: Not on file  Food Insecurity: No Food Insecurity (04/06/2023)   Hunger Vital Sign    Worried About Running Out of Food in the Last Year: Never true    Ran Out of Food in the Last Year: Never true  Transportation Needs: No Transportation Needs (04/06/2023)   PRAPARE - Administrator, Civil Service (Medical): No    Lack of Transportation (Non-Medical): No  Physical Activity: Not on file  Stress: Not on file  Social Connections: Not on file   Sleep: Poor  Appetite:  Poor  Current Medications: Current Facility-Administered Medications  Medication Dose Route Frequency Provider Last Rate Last Admin   acetaminophen (TYLENOL) tablet 650 mg  650  mg Oral Q6H PRN Willeen Cass, Christal H, NP   650 mg at 04/08/23 0756   alum & mag hydroxide-simeth (MAALOX/MYLANTA) 200-200-20 MG/5ML suspension 30 mL  30 mL Oral Q4H PRN Bennett, Christal H, NP       diphenhydrAMINE (BENADRYL) capsule 50 mg  50 mg Oral TID PRN Willeen Cass, Christal H, NP   50 mg at 04/07/23 0454   Or   diphenhydrAMINE (BENADRYL) injection 50 mg  50 mg Intramuscular TID PRN Bennett, Christal H, NP       DULoxetine (CYMBALTA) DR capsule 90 mg  90 mg Oral Daily Starleen Blue, NP   90 mg at 04/12/23 0757   folic acid (FOLVITE) tablet 1 mg  1 mg Oral Daily Bennett, Christal H, NP   1 mg at 04/12/23 0758   gabapentin (NEURONTIN) capsule 300 mg  300 mg Oral TID Starleen Blue, NP   300 mg at 04/12/23 2956   haloperidol (HALDOL) tablet 5 mg  5 mg Oral TID PRN Thurston Hole H, NP   5 mg at 04/07/23 0455   Or   haloperidol lactate (HALDOL) injection 5 mg  5 mg Intramuscular TID PRN Bennett, Christal H, NP       hydrOXYzine (ATARAX) tablet 50 mg  50 mg Oral TID PRN Starleen Blue, NP   50 mg at 04/11/23 1503   magnesium hydroxide (MILK OF MAGNESIA) suspension 30 mL  30 mL Oral Daily PRN  Bennett, Christal H, NP       multivitamin with minerals tablet 1 tablet  1 tablet Oral Daily Bennett, Christal H, NP   1 tablet at 04/12/23 0758   naphazoline-glycerin (CLEAR EYES REDNESS) ophth solution 1-2 drop  1-2 drop Both Eyes BID PRN Starleen Blue, NP       ondansetron (ZOFRAN) tablet 4 mg  4 mg Oral Q12H Leevy-Johnson, Brooke A, NP   4 mg at 04/12/23 0757   pantoprazole (PROTONIX) EC tablet 40 mg  40 mg Oral Daily Bitania Shankland, Tyler Aas, NP   40 mg at 04/12/23 2130   senna-docusate (Senokot-S) tablet 1 tablet  1 tablet Oral BID Starleen Blue, NP   1 tablet at 04/12/23 0758   thiamine (Vitamin B-1) tablet 100 mg  100 mg Oral Daily Bennett, Christal H, NP   100 mg at 04/12/23 0758   traZODone (DESYREL) tablet 100 mg  100 mg Oral QHS Hermie Reagor, NP   100 mg at 04/11/23 2059   traZODone (DESYREL) tablet 50 mg  50 mg Oral QHS PRN Willeen Cass, Christal H, NP   50 mg at 04/06/23 2146    Lab Results:  No results found for this or any previous visit (from the past 48 hour(s)).   Blood Alcohol level:  Lab Results  Component Value Date   ETH 307 [facility]) 04/03/2023    Metabolic Disorder Labs: Lab Results  Component Value Date   HGBA1C 5.7 (H) 04/08/2023   MPG 116.89 04/08/2023   No results found for: "PROLACTIN" Lab Results  Component Value Date   CHOL 226 (H) 04/08/2023   TRIG 149 04/08/2023   HDL 59 04/08/2023   CHOLHDL 3.8 04/08/2023   VLDL 30 04/08/2023   LDLCALC 137 (H) 04/08/2023    Physical Findings: AIMS:  , ,  ,  ,    CIWA:  CIWA-Ar Total: 1 COWS:     Musculoskeletal: Strength & Muscle Tone: within normal limits Gait & Station: normal Patient leans: N/A  Psychiatric Specialty Exam:  Presentation  General Appearance:  Appropriate for  Environment; Fairly Groomed  Eye Contact: Good  Speech: Clear and Coherent  Speech Volume: Normal  Handedness: Right   Mood and Affect  Mood: Anxious; Depressed  Affect: Congruent   Thought Process  Thought  Processes: Coherent  Descriptions of Associations:Intact  Orientation:Full (Time, Place and Person)  Thought Content:Logical  History of Schizophrenia/Schizoaffective disorder:No  Duration of Psychotic Symptoms:No data recorded Hallucinations:Hallucinations: None  Ideas of Reference:None  Suicidal Thoughts:Suicidal Thoughts: No  Homicidal Thoughts:Homicidal Thoughts: No   Sensorium  Memory: Immediate Good  Judgment: Fair  Insight: Fair   Art therapist  Concentration: Good  Attention Span: Good  Recall: Good  Fund of Knowledge: Fair  Language: Fair   Psychomotor Activity  Psychomotor Activity: Psychomotor Activity: Normal   Assets  Assets: Communication Skills   Sleep  Sleep: Sleep: Good    Physical Exam: Physical Exam Constitutional:      Appearance: Normal appearance.  HENT:     Head: Normocephalic.  Eyes:     Pupils: Pupils are equal, round, and reactive to light.  Musculoskeletal:        General: Normal range of motion.     Cervical back: Normal range of motion.  Neurological:     Mental Status: She is alert and oriented to person, place, and time.    Review of Systems  Constitutional:  Negative for fever.  HENT:  Negative for hearing loss.   Eyes:  Negative for blurred vision.  Respiratory:  Negative for cough.   Cardiovascular:  Negative for chest pain.  Gastrointestinal:  Negative for heartburn.  Genitourinary:  Negative for dysuria.  Musculoskeletal:  Negative for myalgias.  Neurological:  Negative for dizziness.  Psychiatric/Behavioral:  Positive for depression and substance abuse. Negative for hallucinations, memory loss and suicidal ideas. The patient is nervous/anxious and has insomnia.    Blood pressure 133/80, pulse 81, temperature 98.8 F (37.1 C), temperature source Oral, resp. rate 18, height 5\' 4"  (1.626 m), weight 65.8 kg, SpO2 98 %. Body mass index is 24.89 kg/m.   Treatment Plan  Summary: PLAN Safety and Monitoring: Voluntary admission to inpatient psychiatric unit for safety, stabilization and treatment Daily contact with patient to assess and evaluate symptoms and progress in treatment Patient's case to be discussed in multi-disciplinary team meeting Observation Level : q15 minute checks Vital signs: q12 hours Precautions: Safety   Long Term Goal(s): Improvement in symptoms so as ready for discharge   Short Term Goals: Ability to identify changes in lifestyle to reduce recurrence of condition will improve, Ability to verbalize feelings will improve, Ability to disclose and discuss suicidal ideas, Ability to demonstrate self-control will improve, Ability to identify and develop effective coping behaviors will improve, Ability to maintain clinical measurements within normal limits will improve, Compliance with prescribed medications will improve, and Ability to identify triggers associated with substance abuse/mental health issues will improve   Diagnoses:  Principal Problem:   MDD (major depressive disorder), recurrent severe, without psychosis (HCC) Active Problems:   Alcohol use disorder   GAD (generalized anxiety disorder)   GERD (gastroesophageal reflux disease)   Medications -Continue Gabapentin to 300 mg TID for ETOH use d/o & GAD -Continue Cymbalta 90 mg daily for depressive symptoms  -Start Colace daily for constipation and discontinue Colace-Senna -Start Eye drops BID PRN for eye irritation -Continue Librium taper for alcohol use disorder -Continue trazodone 100 mg nightly for sleep, and continue trazodone 50 mg as needed nightly for sleep -Continue hydroxyzine 50 mg 3 times daily as needed for  anxiety -Continue Protonix 40 mg daily for GERD   Patient has been educated on rationales, benefits, and possible side effects of all medications as listed above.  She is agreeable to taking medications.   Other PRNS -Continue Tylenol 650 mg every 6 hours  PRN for mild pain -Continue Maalox 30 mg every 4 hrs PRN for indigestion -Continue Imodium 2-4 mg as needed for diarrhea -Continue Milk of Magnesia as needed every 6 hrs for constipation -Continue Zofran disintegrating tabs BID PRN.   Discharge Planning: Social work and case management to assist with discharge planning and identification of hospital follow-up needs prior to discharge Estimated LOS: 5-7 days Discharge Concerns: Need to establish a safety plan; Medication compliance and effectiveness Discharge Goals: Return home with outpatient referrals for mental health follow-up including medication management/psychotherapy   I certify that inpatient services furnished can reasonably be expected to improve the patient's condition.      Starleen Blue, NP 04/12/2023, 1:14 PMPatient ID: [name], female   DOB: October 28, 1966, 57 y.o.   MRN: [id number] Patient ID: LUCITA REHAGEN, female   DOB: 25-Oct-1966, 57 y.o.   MRN: [id number] Patient ID: ULANI ROEDERER, female   DOB: 1966/07/27, 57 y.o.   MRN: [id number]

## 2023-04-12 NOTE — Progress Notes (Signed)
Pt on unit programming and medication compliant. Pt denies withdrawal symptoms today and denies n/v. Pt denies SI/HI/AVH. Pts affect brighter and pts anxiety has been tolerable today. Pt endorses dry eyes, new order noted for eye drops. Q 15 minute checks ongoing.

## 2023-04-12 NOTE — Progress Notes (Signed)
   04/11/23 2200  Psych Admission Type (Psych Patients Only)  Admission Status Voluntary  Psychosocial Assessment  Patient Complaints Anxiety  Eye Contact Fair  Facial Expression Animated  Affect Appropriate to circumstance;Anxious  Speech Logical/coherent  Interaction Assertive  Motor Activity Slow  Appearance/Hygiene Unremarkable  Behavior Characteristics Cooperative;Appropriate to situation  Mood Anxious  Thought Process  Coherency WDL  Content WDL  Delusions None reported or observed  Perception WDL  Hallucination None reported or observed  Judgment Poor  Confusion None  Danger to Self  Current suicidal ideation? Denies  Self-Injurious Behavior No self-injurious ideation or behavior indicators observed or expressed   Agreement Not to Harm Self Yes  Description of Agreement verbal  Danger to Others  Danger to Others None reported or observed

## 2023-04-13 MED ORDER — DULOXETINE HCL 30 MG PO CPEP: 90.0000 mg | ORAL_CAPSULE | Freq: Every day | ORAL | 0 refills | Status: DC

## 2023-04-13 MED ORDER — TRAZODONE HCL 100 MG PO TABS: 100.0000 mg | ORAL_TABLET | Freq: Every day | ORAL | 0 refills | Status: DC

## 2023-04-13 MED ORDER — PANTOPRAZOLE SODIUM 40 MG PO TBEC: 40.0000 mg | DELAYED_RELEASE_TABLET | Freq: Every day | ORAL | 0 refills | Status: DC

## 2023-04-13 MED ORDER — GABAPENTIN 300 MG PO CAPS: 300.0000 mg | ORAL_CAPSULE | Freq: Three times a day (TID) | ORAL | 0 refills | Status: DC

## 2023-04-13 MED ORDER — HYDROXYZINE HCL 50 MG PO TABS: 50.0000 mg | ORAL_TABLET | Freq: Three times a day (TID) | ORAL | 0 refills | Status: DC | PRN

## 2023-04-13 NOTE — Progress Notes (Signed)
Pt medication compliant. Pt endorses anxiety this morning, denies suicidal and homicidal thoughts. Pt denies AVH. Pt became upset at news of discharge today and stated she wanted to leave tomorrow or Tuesday. Pt called nurse practitioner a bitch. Pt stated her family could not pick her up. Taxi voucher or bus pass offered however at this time pt declines both. Pt yelling on unit. Pt escorted to lobby. Pt called family and now states someone is on the way to pick her up.

## 2023-04-13 NOTE — Group Note (Signed)
Date:  04/13/2023 Time:  1:30 PM  Group Topic/Focus:  Goals Group:   The focus of this group is to help patients establish daily goals to achieve during treatment and discuss how the patient can incorporate goal setting into their daily lives to aide in recovery.    Participation Level:  Active  Participation Quality:  Appropriate  Affect:  Appropriate  Cognitive:  Appropriate  Insight: Appropriate  Engagement in Group:  Engaged  Modes of Intervention:  Discussion  Additional Comments:     Reymundo Poll 04/13/2023, 1:30 PM

## 2023-04-13 NOTE — Progress Notes (Addendum)
   04/12/23 2050  Psych Admission Type (Psych Patients Only)  Admission Status Voluntary  Psychosocial Assessment  Patient Complaints Anxiety  Eye Contact Fair  Facial Expression Animated  Affect Appropriate to circumstance  Speech Logical/coherent  Interaction Assertive  Motor Activity Other (Comment) (WNL)  Appearance/Hygiene Unremarkable  Behavior Characteristics Cooperative  Mood Anxious;Pleasant  Thought Process  Coherency WDL  Content WDL  Delusions None reported or observed  Perception WDL  Hallucination None reported or observed  Judgment Impaired  Confusion None  Danger to Self  Current suicidal ideation? Denies  Self-Injurious Behavior No self-injurious ideation or behavior indicators observed or expressed   Agreement Not to Harm Self Yes  Description of Agreement Verbal contract for safety  Danger to Others  Danger to Others None reported or observed   Pt reports 9/10 anxiety and 6/10 depression. Pt is pleasant and cooperative. Pt was offered support and encouragement. Pt was given scheduled medications and PRN Hydroxyzine per MAR. Q 15 minute checks were done for safety. Pt attends groups and interacts well with peers and staff. Pt has no complaints. Pt receptive to treatment and safety maintained on unit.

## 2023-04-13 NOTE — Progress Notes (Signed)
   04/13/23 0554  15 Minute Checks  Location Hallway  Visual Appearance Calm  Behavior Sleeping  Sleep (Behavioral Health Patients Only)  Calculate sleep? (Click Yes once per 24 hr at 0600 safety check) Yes  Documented sleep last 24 hours 7.75

## 2023-04-13 NOTE — Progress Notes (Signed)
  [facility] Adult Case Management Discharge Plan :  Will you be returning to the same living situation after discharge:  Yes,  to home At discharge, do you have transportation home?: No. Pt declines taxi or bus pass. Will find own way home. Do you have the ability to pay for your medications: Yes,  has insurance  Release of information consent forms completed and in the chart;  Patient's signature needed at discharge.  Patient to Follow up at:  Follow-up Information     [facility] Go on 04/15/2023.   Why: appointment 1pm.  Please arrive 15 minutes early with your identification, discharge summary and insurance card. Contact information: [address] [place] [address] [phone]                 Next level of care provider has access to [facility] Link:no  Safety Planning and Suicide Prevention discussed: Yes,  with mother     Has patient been referred to the Quitline?: Patient does not use tobacco/nicotine products  Patient has been referred for addiction treatment: Yes, the patient will follow up with an outpatient provider for substance use disorder. Psychiatrist/APP: appointment made  Otelia Santee, LCSW 04/13/2023, 1:45 PM

## 2023-04-13 NOTE — Progress Notes (Addendum)
Patient denies SI, denied HI, denies AVH and denied paranoia earlier today morning when asked prior to decision being made to discharge her. She denies any plan or intent to harm herself or any one else outside of the [facility] if discharged. Pt was therefore informed earlier today morning that since there would be no changes in medications prior to tomorrow, we will discharge her today since her mood is stable to management on the outpatient. Pt was also informed that her discharge date was supposed to be last Friday, but additional time (2 days) were added since she requested to have additional time at the hospital. Pt was adequately educated on outpatient resources including calling the crises hotline number (988) or 911 should suicidal thoughts return. Pt was educated that we are a crises stabilization unit, and she is no longer in a crises situation, and current symptoms can be managed by her outpatient provider.   Pt lashed out verbally at writer and became extremely verbally aggressive when told that she would be discharged, called writer "Bitch" repeatedly, presented to the day room and talked to other patients at which point most patients in the day room walked out of day room to protest patient's discharge, and render the unit difficult to manage. Other patients redirected back to the day room with a show of support from other patients, security called, and pt escorted off unit to lobby where she states she will wait for her brother to pick her up. Pt has repeatedly told staff that she has an apartment, resides by herself, and there is a "p.o box" listed as her address. There are concerns that behaviors exhibited are a means to remain hospitalized so as to have secondary gains of shelter and food. Pt has not been forthcoming regarding her stressors since hospitalization, and there are suspicions of homelessness even though she has consistently reported that she has a home. She continued to state that she  has a home as she was escorted off the unit. Pt has refused through entire hospitalization to provide consent for her family to be contacted for collateral information.

## 2023-04-13 NOTE — BHH Suicide Risk Assessment (Signed)
Suicide Risk Assessment  Discharge Assessment    [facility] Discharge Suicide Risk Assessment   Principal Problem: MDD (major depressive disorder), recurrent severe, without psychosis (HCC) Discharge Diagnoses: Principal Problem:   MDD (major depressive disorder), recurrent severe, without psychosis (HCC) Active Problems:   Alcohol use disorder   GAD (generalized anxiety disorder)   GERD (gastroesophageal reflux disease)  Reason for admission: Tracy Phillips is a a 57 yo CF with prior mental health diagnoses of alcohol use d/o, MDD & GAD who presented to the [facility] hospital ER on 5/17 with complaints of worsening depressive symptoms and +SI in the context of alcohol intoxication and psychosocial stressors.  Patient was medically cleared, and transferred voluntarily to this behavioral health Hospital for treatment and stabilization of her mental status.   Hospital Course: During the patient's hospitalization, patient had extensive initial psychiatric evaluation, and follow-up psychiatric evaluations every day. Psychiatric diagnoses provided upon initial assessment are as listed above. Patient's psychiatric medications were adjusted on admission as follows: -Started Cymbalta 60 mg daily for depressive symptoms -Started Librium taper for alcohol use disorder -Started trazodone 50 mg nightly for sleep, and start trazodone 50 mg as needed nightly for sleep -Started hydroxyzine 50 mg 3 times daily as needed for anxiety -Started Protonix 40 mg daily for GERD  During the hospitalization, other adjustments were made to the patient's psychiatric medication regimen. Medications at discharge are as follows:  -Continue Gabapentin to 300 mg TID for ETOH use d/o & GAD -Continue trazodone 100 mg nightly for sleep,  -Continue hydroxyzine 50 mg 3 times daily as needed for anxiety -Continue Protonix 40 mg daily for GERD    Patient's care was discussed during the interdisciplinary team meeting every day  during the hospitalization. The patient denies having side effects to prescribed psychiatric medication. Gradually, patient started adjusting to milieu. The patient was evaluated each day by a clinical provider to ascertain response to treatment. Improvement was noted by the patient's report of decreasing symptoms, improved sleep and appetite, affect, medication tolerance, behavior, and participation in unit programming.  Patient was asked each day to complete a self inventory noting mood, mental status, pain, new symptoms, anxiety and concerns.    Symptoms were reported as significantly decreased or resolved completely by discharge. On day of discharge, the patient reports that their mood is stable. The patient denied having suicidal thoughts for more than 48 hours prior to discharge.  Patient denies having homicidal thoughts.  Patient denies having auditory hallucinations.  Patient denies any visual hallucinations or other symptoms of psychosis. The patient was motivated to continue taking medication with a goal of continued improvement in mental health.   The patient reports their target psychiatric symptoms of depression, anxiety and insomnia responded well to the psychiatric medications, and the patient reports overall benefit from this psychiatric hospitalization. Supportive psychotherapy was provided to the patient. The patient also participated in regular group therapy while hospitalized. Coping skills, problem solving as well as relaxation therapies were also part of the unit programming.  Labs were reviewed with the patient, and abnormal results were discussed with the patient.  The patient is able to verbalize their individual safety plan to this provider.  # It is recommended to the patient to continue psychiatric medications as prescribed, after discharge from the hospital.    # It is recommended to the patient to follow up with your outpatient psychiatric provider and PCP.  # It was  discussed with the patient, the impact of alcohol, drugs, tobacco  have been there overall psychiatric and medical wellbeing, and total abstinence from substance use was recommended the patient.ed.  # Prescriptions provided or sent directly to preferred pharmacy at discharge. Patient agreeable to plan. Given opportunity to ask questions. Appears to feel comfortable with discharge.    # In the event of worsening symptoms, the patient is instructed to call the crisis hotline (988), 911 and or go to the nearest ED for appropriate evaluation and treatment of symptoms. To follow-up with primary care provider for other medical issues, concerns and or health care needs  # Patient was discharged home with a plan to follow up as noted below.   Total Time spent with patient: 45 minutes  Musculoskeletal: Strength & Muscle Tone: within normal limits Gait & Station: normal Patient leans: N/A  Psychiatric Specialty Exam  Presentation  General Appearance:  Appropriate for Environment; Fairly Groomed  Eye Contact: Fair  Speech: Clear and Coherent  Speech Volume: Normal  Handedness: Right   Mood and Affect  Mood: Euthymic  Duration of Depression Symptoms: Greater than two weeks  Affect: Appropriate; Congruent   Thought Process  Thought Processes: Coherent  Descriptions of Associations:Intact  Orientation:Full (Time, Place and Person)  Thought Content:Logical  History of Schizophrenia/Schizoaffective disorder:No  Duration of Psychotic Symptoms:No data recorded Hallucinations:Hallucinations: None  Ideas of Reference:None  Suicidal Thoughts:Suicidal Thoughts: No  Homicidal Thoughts:Homicidal Thoughts: No   Sensorium  Memory: Immediate Good  Judgment: Fair  Insight: Fair   Art therapist  Concentration: Good  Attention Span: Good  Recall: Fair  Fund of Knowledge: Fair  Language: Fair   Psychomotor Activity  Psychomotor  Activity: Psychomotor Activity: Normal   Assets  Assets: Communication Skills   Sleep  Sleep: Sleep: Good   Physical Exam: Physical Exam Constitutional:      Appearance: Normal appearance.  HENT:     Head: Normocephalic.     Nose: Nose normal.  Musculoskeletal:        General: Normal range of motion.     Cervical back: Normal range of motion.  Neurological:     Mental Status: She is alert and oriented to person, place, and time.    Review of Systems  Constitutional:  Negative for fever.  HENT:  Negative for hearing loss.   Eyes:  Negative for blurred vision.  Respiratory:  Negative for cough.   Cardiovascular:  Negative for chest pain.  Gastrointestinal:  Negative for heartburn.  Genitourinary:  Negative for dysuria.  Musculoskeletal:  Negative for myalgias.  Skin:  Negative for rash.  Psychiatric/Behavioral:  Positive for depression (Denies SI/HI/AVH, verbally contracts for safety outside of [facility]) and substance abuse (Educated on the negative impact of substance use on her mental health and educated on the need for cessation). Negative for hallucinations, memory loss and suicidal ideas. The patient is nervous/anxious (Resolving with current medications) and has insomnia (Resolving on current medications).    Blood pressure 111/67, pulse 78, temperature 98.6 F (37 C), temperature source Oral, resp. rate 16, height 5\' 4"  (1.626 m), weight 65.8 kg, SpO2 100 %. Body mass index is 24.89 kg/m.  Mental Status Per Nursing Assessment::   On Admission:  Suicidal ideation indicated by patient, Suicidal ideation indicated by others  Demographic Factors:  Low socioeconomic status, Living alone, and Unemployed  Loss Factors: Financial problems/change in socioeconomic status  Historical Factors: Family history of mental illness or substance abuse  Risk Reduction Factors:   Sense of responsibility to family and Positive therapeutic relationship  Continued Clinical  Symptoms:  Patient reports that his depressive symptoms are resolving. He denies SI, denies HI, denies AVH, denies paranoia and verbally contracts for safety outside of [facility]. Pt denies any intent or plan to harm herself or any one else.  Cognitive Features That Contribute To Risk:  None    Suicide Risk:  Mild:  There are no identifiable suicide plans, no associated intent, mild dysphoria and related symptoms, good self-control (both objective and subjective assessment), few other risk factors, and identifiable protective factors, including available and accessible social support.    Follow-up Information     [facility] Go on 04/15/2023.   Why: appointment 1pm.  Please arrive 15 minutes early with your identification, discharge summary and insurance card. Contact information: [address] [place] [address] [phone]                Starleen Blue, NP 04/13/2023, 9:52 AM

## 2023-04-13 NOTE — BHH Group Notes (Signed)
[facility] Group Notes:  (Nursing/MHT/Case Management/Adjunct)  Date:  04/13/2023  Time:  4:43 AM  Type of Therapy:   Wrap-up group  Participation Level:  Active  Participation Quality:  Appropriate  Affect:  Appropriate  Cognitive:  Appropriate  Insight:  Appropriate  Engagement in Group:  Engaged  Modes of Intervention:  Education  Summary of Progress/Problems: Goal drink more water, day 8/10.  Tracy Phillips 04/13/2023, 4:43 AM

## 2023-04-15 ENCOUNTER — Telehealth: Payer: Self-pay | Admitting: Internal Medicine

## 2023-04-15 DIAGNOSIS — F1029 Alcohol dependence with unspecified alcohol-induced disorder: Secondary | ICD-10-CM

## 2023-04-15 DIAGNOSIS — F411 Generalized anxiety disorder: Secondary | ICD-10-CM

## 2023-04-15 DIAGNOSIS — F332 Major depressive disorder, recurrent severe without psychotic features: Secondary | ICD-10-CM

## 2023-04-15 NOTE — Telephone Encounter (Signed)
Ok to place psych referral to someone in [place]?

## 2023-04-15 NOTE — Telephone Encounter (Signed)
Please call and confirm doing ok.  I am ok with placing a referral to psych, confirm if has a preference.  Also needs a hospital follow up with me.

## 2023-04-15 NOTE — Telephone Encounter (Signed)
Pt called stating she just got out of behavioral health and her discharge papers stated they wanted her to go to [place] for a psychiatric referral and the pt want a closer referral in [place]. Pt will like to be called  [phone]

## 2023-04-15 NOTE — Telephone Encounter (Signed)
Pt scheduled for HFU 6/3. Does not have preference for psych but says needs appt asap. Has enough meds to last until 6/5. The hospital had her set up for outpatient psych today at 1:00 pm but she did not go because it was in [employer].

## 2023-04-16 NOTE — Addendum Note (Signed)
Addended by: Charm Barges on: 04/16/2023 02:21 PM   Modules accepted: Orders

## 2023-04-16 NOTE — Telephone Encounter (Signed)
Notify - I placed urgent referral to psychiatry.

## 2023-04-16 NOTE — Telephone Encounter (Signed)
Patient states she is not better but not worse today.  Patient states she is staying with her mom which is why she did not answer her phone to just stay peaceful.  I relayed Dr. Westley Hummer Scott's message and patient states she is glad she has made the referral urgent.  Patient states she would like to know who the referral is with and I let her know it's with Caryn Section, MD.

## 2023-04-16 NOTE — Telephone Encounter (Signed)
LMTCB

## 2023-04-16 NOTE — Telephone Encounter (Signed)
Noted  

## 2023-04-21 ENCOUNTER — Ambulatory Visit (INDEPENDENT_AMBULATORY_CARE_PROVIDER_SITE_OTHER): Payer: BC Managed Care – PPO | Admitting: Internal Medicine

## 2023-04-21 ENCOUNTER — Encounter: Payer: Self-pay | Admitting: Internal Medicine

## 2023-04-21 VITALS — BP 130/78 | HR 73 | Temp 97.9°F | Resp 16 | Ht 64.0 in | Wt 151.0 lb

## 2023-04-21 DIAGNOSIS — K219 Gastro-esophageal reflux disease without esophagitis: Secondary | ICD-10-CM | POA: Diagnosis not present

## 2023-04-21 DIAGNOSIS — M25552 Pain in left hip: Secondary | ICD-10-CM

## 2023-04-21 DIAGNOSIS — F1029 Alcohol dependence with unspecified alcohol-induced disorder: Secondary | ICD-10-CM | POA: Diagnosis not present

## 2023-04-21 DIAGNOSIS — F419 Anxiety disorder, unspecified: Secondary | ICD-10-CM

## 2023-04-21 DIAGNOSIS — F332 Major depressive disorder, recurrent severe without psychotic features: Secondary | ICD-10-CM

## 2023-04-21 DIAGNOSIS — E7800 Pure hypercholesterolemia, unspecified: Secondary | ICD-10-CM

## 2023-04-21 MED ORDER — BISACODYL 10 MG RE SUPP: RECTAL | 0 refills | Status: DC

## 2023-04-21 MED ORDER — BUSPIRONE HCL 10 MG PO TABS
10.0000 mg | ORAL_TABLET | Freq: Two times a day (BID) | ORAL | 0 refills | Status: DC
Start: 2023-04-21 — End: 2023-06-20

## 2023-04-21 NOTE — Patient Instructions (Addendum)
Decrease gabapentin to twice a day.   Take buspar 10mg  twice a day.    Hydroxyzine - as needed.    Dulcolax suppository as directed.   Remain off trazodone

## 2023-04-21 NOTE — Telephone Encounter (Signed)
Dr Maryruth Bun office called stating they did not receive the clinical notes for the pt

## 2023-04-21 NOTE — Progress Notes (Signed)
Subjective:    Patient ID: Tracy Phillips, female    DOB: 02/26/1966, 57 y.o.   MRN: [id number]  Patient here for  Chief Complaint  Patient presents with   Hospitalization Follow-up    HPI Here for hospital follow up.  Admitted 04/03/23 - 04/06/23- after presenting with alcohol intoxication.  Developed evidence of alcohol withdrawal and was placed on CiWA protocol.  Also expressed SI and psychiatry was consulted.  Treated with librium and CIWA protocol.  Duing hospitalization, symptoms improved.  Appetite improved.  Blood pressure improved and was felt to be secondary to alcohol withdrawal.  Was transferred to psych unit.  Admitted to psych unit with MDD, alcohol use disorder and GAD. During hospitalization, she became more depressed.  Anxiety worsened and began having panic attacks. Was started on cymbalta and trazodone.  Librium taper for alcohol use disorder.  Also given hydroxyzine to have prn. Discharged on cymbalta, trazodone (not taking), gabapentin and hydroxyzine.  Increased anxiety persists.  Taking hydroxyzine scheduled.  Increased appetite.  Weight is up.  She is sleeping approximately 4-5 hours per night.  Does not feel needs trazodone.  Has not had any alcohol or cocaine since her discharge.  Has not desire.  Main concern is the increased anxiety.  No SI.  Has good supportive family.  Staying with her mother right now. Agreeable to psychiatry follow up.    Past Medical History:  Diagnosis Date   Depression    H/O dizziness    H/O eating disorder    H/O: alcohol abuse    Herpes    H/O   History of chicken pox    History of colon polyps    Hyperlipidemia    Hypertension    Kidney stones    H/O stones   Past Surgical History:  Procedure Laterality Date   ABDOMINAL HYSTERECTOMY  1998   APPENDECTOMY  1997   DILATION AND CURETTAGE OF UTERUS     TONSILLECTOMY  1997   Family History  Problem Relation Age of Onset   Hyperlipidemia Mother    Hypertension Mother    Breast  cancer Mother 44   Arthritis Father    Hyperlipidemia Father    Hypertension Father    Diabetes Father    Hypertension Brother    Arthritis Maternal Aunt    Hyperlipidemia Maternal Aunt    Hypertension Maternal Aunt    Alcohol abuse Maternal Uncle    Arthritis Maternal Uncle    Hyperlipidemia Maternal Uncle    Hypertension Maternal Uncle    Arthritis Paternal Aunt    Hyperlipidemia Paternal Aunt    Hypertension Paternal Aunt    Arthritis Paternal Uncle    Hyperlipidemia Paternal Uncle    Hypertension Paternal Uncle    Arthritis Maternal Grandmother    Hyperlipidemia Maternal Grandmother    Hypertension Maternal Grandmother    Arthritis Maternal Grandfather    Lung cancer Maternal Grandfather    Prostate cancer Maternal Grandfather    Hyperlipidemia Maternal Grandfather    Stroke Maternal Grandfather    Hypertension Maternal Grandfather    Arthritis Paternal Grandmother    Hyperlipidemia Paternal Grandmother    Hypertension Paternal Grandmother    Alcohol abuse Paternal Grandfather    Arthritis Paternal Grandfather    Hyperlipidemia Paternal Grandfather    Hypertension Paternal Grandfather    Sudden death Paternal Grandfather    Social History   Socioeconomic History   Marital status: Single    Spouse name: Not on file   Number  of children: Not on file   Years of education: Not on file   Highest education level: Not on file  Occupational History   Not on file  Tobacco Use   Smoking status: Never   Smokeless tobacco: Never  Vaping Use   Vaping Use: Never used  Substance and Sexual Activity   Alcohol use: No    Comment: sober since 10/27/2010   Drug use: No   Sexual activity: Not on file  Other Topics Concern   Not on file  Social History Narrative   Not on file   Social Determinants of Health   Financial Resource Strain: Not on file  Food Insecurity: No Food Insecurity (04/06/2023)   Hunger Vital Sign    Worried About Running Out of Food in the Last  Year: Never true    Ran Out of Food in the Last Year: Never true  Transportation Needs: No Transportation Needs (04/06/2023)   PRAPARE - Administrator, Civil Service (Medical): No    Lack of Transportation (Non-Medical): No  Physical Activity: Not on file  Stress: Not on file  Social Connections: Not on file     Review of Systems  Constitutional:        Increased appetite.  Weight gain reported.   HENT:  Negative for congestion and sinus pressure.   Respiratory:  Negative for cough, chest tightness and shortness of breath.   Cardiovascular:  Negative for chest pain, palpitations and leg swelling.  Gastrointestinal:  Negative for abdominal pain, diarrhea, nausea and vomiting.  Genitourinary:  Negative for difficulty urinating and dysuria.  Musculoskeletal:  Negative for joint swelling and myalgias.  Skin:  Negative for color change and rash.  Neurological:  Negative for dizziness and headaches.  Psychiatric/Behavioral:         Increased stress and anxiety as outlined.        Objective:     BP 130/78   Pulse 73   Temp 97.9 F (36.6 C)   Resp 16   Ht 5\' 4"  (1.626 m)   Wt 151 lb (68.5 kg)   SpO2 98%   BMI 25.92 kg/m  Wt Readings from Last 3 Encounters:  04/21/23 151 lb (68.5 kg)  04/04/23 145 lb (65.8 kg)  02/23/20 135 lb (61.2 kg)    Physical Exam Vitals reviewed.  Constitutional:      General: She is not in acute distress.    Appearance: Normal appearance.  HENT:     Head: Normocephalic and atraumatic.     Right Ear: External ear normal.     Left Ear: External ear normal.  Eyes:     General: No scleral icterus.       Right eye: No discharge.        Left eye: No discharge.     Conjunctiva/sclera: Conjunctivae normal.  Neck:     Thyroid: No thyromegaly.  Cardiovascular:     Rate and Rhythm: Normal rate and regular rhythm.  Pulmonary:     Effort: No respiratory distress.     Breath sounds: Normal breath sounds. No wheezing.  Abdominal:      General: Bowel sounds are normal.     Palpations: Abdomen is soft.     Tenderness: There is no abdominal tenderness.  Musculoskeletal:        General: No swelling or tenderness.     Cervical back: Neck supple. No tenderness.  Lymphadenopathy:     Cervical: No cervical adenopathy.  Skin:  Findings: No erythema or rash.  Neurological:     Mental Status: She is alert.  Psychiatric:        Mood and Affect: Mood normal.        Behavior: Behavior normal.      Outpatient Encounter Medications as of 04/21/2023  Medication Sig   bisacodyl (DULCOLAX) 10 MG suppository Use one per rectum bid prn   busPIRone (BUSPAR) 10 MG tablet Take 1 tablet (10 mg total) by mouth 2 (two) times daily.   DULoxetine (CYMBALTA) 30 MG capsule Take 3 capsules (90 mg total) by mouth daily.   gabapentin (NEURONTIN) 300 MG capsule Take 1 capsule (300 mg total) by mouth 3 (three) times daily.   hydrOXYzine (ATARAX) 50 MG tablet Take 1 tablet (50 mg total) by mouth 3 (three) times daily as needed for anxiety.   hydrOXYzine (ATARAX) 50 MG tablet Take 1 tablet (50 mg total) by mouth 3 (three) times daily as needed for anxiety.   pantoprazole (PROTONIX) 40 MG tablet Take 1 tablet (40 mg total) by mouth daily.   [DISCONTINUED] traZODone (DESYREL) 100 MG tablet Take 1 tablet (100 mg total) by mouth at bedtime.   No facility-administered encounter medications on file as of 04/21/2023.    Medications reviewed and reconciled with pt.    Lab Results  Component Value Date   WBC 6.4 04/04/2023   HGB 13.9 04/04/2023   HCT 42.0 04/04/2023   PLT 189 04/04/2023   GLUCOSE 120 (H) 04/06/2023   CHOL 226 (H) 04/08/2023   TRIG 149 04/08/2023   HDL 59 04/08/2023   LDLDIRECT 132.0 05/13/2019   LDLCALC 137 (H) 04/08/2023   ALT 27 04/04/2023   AST 48 (H) 04/04/2023   NA 139 04/06/2023   K 3.8 04/06/2023   CL 106 04/06/2023   CREATININE 0.64 04/06/2023   BUN 14 04/06/2023   CO2 26 04/06/2023   TSH 2.288 04/08/2023   HGBA1C  5.7 (H) 04/08/2023    No results found.     Assessment & Plan:  Left hip pain -     DG HIP UNILAT W OR W/O PELVIS 2-3 VIEWS LEFT; Future  Alcohol dependence with unspecified alcohol-induced disorder [facility]) Assessment & Plan: Recent hospitalization as outlined.  Had bene sober for 13 years.  No alcohol since discharge. Has not desire to restart.  Discussed psychiatry referral.  Agreeable.  Has supportive family.    Anxiety Assessment & Plan: Increased stress and anxiety as outlined.  Recent hospitalization as outlined.  Currently on cymbalta 90mg  q day.  Add buspar as directed.  Have hydroxyzine to take prn.  Contacted psychiatry.  Work in Nutritional therapist.  Pt agreeable for f/u.    Gastroesophageal reflux disease, unspecified whether esophagitis present Assessment & Plan: On protonix.    Hypercholesterolemia Assessment & Plan: Low cholesterol diet and exercise.  Follow lipid panel.    Severe episode of recurrent major depressive disorder, without psychotic features [facility]) Assessment & Plan: Recent hospitalization as outlined.  Currently on cymbalta.  Not taking trazodone.  Does not feel needs.  Add buspar to take scheduled bid.  Has hydroxyzine to take prn.  No SI. Discussed with her.  States she does not want to harm herself.  Agreeable to psychiatry f/u.  Called psychiatry. Appt made for tomorrow.    Other orders -     busPIRone HCl; Take 1 tablet (10 mg total) by mouth 2 (two) times daily.  Dispense: 60 tablet; Refill: 0 -  Bisacodyl; Use one per rectum bid prn  Dispense: 10 suppository; Refill: 0     Dale Nuremberg, MD

## 2023-04-21 NOTE — Telephone Encounter (Signed)
Dr Maryruth Bun is seeing her tomorrow.

## 2023-04-22 DIAGNOSIS — F1011 Alcohol abuse, in remission: Secondary | ICD-10-CM | POA: Diagnosis not present

## 2023-04-22 DIAGNOSIS — F411 Generalized anxiety disorder: Secondary | ICD-10-CM | POA: Diagnosis not present

## 2023-04-22 DIAGNOSIS — F4002 Agoraphobia without panic disorder: Secondary | ICD-10-CM | POA: Diagnosis not present

## 2023-04-22 DIAGNOSIS — F331 Major depressive disorder, recurrent, moderate: Secondary | ICD-10-CM | POA: Diagnosis not present

## 2023-04-25 NOTE — Discharge Summary (Signed)
Physician Discharge Summary Note  Patient:  Tracy Phillips is an 57 y.o., female MRN:  [id number] DOB:  04/20/1966 Patient phone:  [phone] (home)  Patient address:   [address] [place] [address],  Total Time spent with patient: 45 minutes  Date of Admission:  04/06/2023 Date of Discharge: 04/13/2023  Reason for admission: Tracy Phillips is a a 57 yo CF with prior mental health diagnoses of alcohol use d/o, MDD & GAD who presented to the [facility] hospital ER on 5/17 with complaints of worsening depressive symptoms and +SI in the context of alcohol intoxication and psychosocial stressors.  Patient was medically cleared, and transferred voluntarily to this behavioral health Hospital for treatment and stabilization of her mental status.    Hospital Course: During the patient's hospitalization, patient had extensive initial psychiatric evaluation, and follow-up psychiatric evaluations every day. Psychiatric diagnoses provided upon initial assessment are as listed above. Patient's psychiatric medications were adjusted on admission as follows: -Started Cymbalta 60 mg daily for depressive symptoms -Started Librium taper for alcohol use disorder -Started trazodone 50 mg nightly for sleep, and start trazodone 50 mg as needed nightly for sleep -Started hydroxyzine 50 mg 3 times daily as needed for anxiety -Started Protonix 40 mg daily for GERD   During the hospitalization, other adjustments were made to the patient's psychiatric medication regimen. Medications at discharge are as follows:   -Continue Gabapentin to 300 mg TID for ETOH use d/o & GAD -Continue trazodone 100 mg nightly for sleep,  -Continue hydroxyzine 50 mg 3 times daily as needed for anxiety -Continue Protonix 40 mg daily for GERD     Patient's care was discussed during the interdisciplinary team meeting every day during the hospitalization. The patient denies having side effects to prescribed psychiatric medication.  Gradually, patient started adjusting to milieu. The patient was evaluated each day by a clinical provider to ascertain response to treatment. Improvement was noted by the patient's report of decreasing symptoms, improved sleep and appetite, affect, medication tolerance, behavior, and participation in unit programming.  Patient was asked each day to complete a self inventory noting mood, mental status, pain, new symptoms, anxiety and concerns.     Symptoms were reported as significantly decreased or resolved completely by discharge. On day of discharge, the patient reports that their mood is stable. The patient denied having suicidal thoughts for more than 48 hours prior to discharge.  Patient denies having homicidal thoughts.  Patient denies having auditory hallucinations.  Patient denies any visual hallucinations or other symptoms of psychosis. The patient was motivated to continue taking medication with a goal of continued improvement in mental health.    The patient reports their target psychiatric symptoms of depression, anxiety and insomnia responded well to the psychiatric medications, and the patient reports overall benefit from this psychiatric hospitalization. Supportive psychotherapy was provided to the patient. The patient also participated in regular group therapy while hospitalized. Coping skills, problem solving as well as relaxation therapies were also part of the unit programming.   Labs were reviewed with the patient, and abnormal results were discussed with the patient.   The patient is able to verbalize their individual safety plan to this provider.   # It is recommended to the patient to continue psychiatric medications as prescribed, after discharge from the hospital.     # It is recommended to the patient to follow up with your outpatient psychiatric provider and PCP.   # It was discussed with the patient, the impact  of alcohol, drugs, tobacco have been there overall psychiatric  and medical wellbeing, and total abstinence from substance use was recommended the patient.ed.   # Prescriptions provided or sent directly to preferred pharmacy at discharge. Patient agreeable to plan. Given opportunity to ask questions. Appears to feel comfortable with discharge.    # In the event of worsening symptoms, the patient is instructed to call the crisis hotline (988), 911 and or go to the nearest ED for appropriate evaluation and treatment of symptoms. To follow-up with primary care provider for other medical issues, concerns and or health care needs   # Patient was discharged home with a plan to follow up as noted below.    Total Time spent with patient: 45 minutes  Principal Problem: MDD (major depressive disorder), recurrent severe, without psychosis (HCC) Discharge Diagnoses: Principal Problem:   MDD (major depressive disorder), recurrent severe, without psychosis (HCC) Active Problems:   Alcohol use disorder   GAD (generalized anxiety disorder)   GERD (gastroesophageal reflux disease)  Past Psychiatric History: See H & P  Past Medical History:  Past Medical History:  Diagnosis Date   Depression    H/O dizziness    H/O eating disorder    H/O: alcohol abuse    Herpes    H/O   History of chicken pox    History of colon polyps    Hyperlipidemia    Hypertension    Kidney stones    H/O stones    Past Surgical History:  Procedure Laterality Date   ABDOMINAL HYSTERECTOMY  1998   APPENDECTOMY  1997   DILATION AND CURETTAGE OF UTERUS     TONSILLECTOMY  1997   Family History:  Family History  Problem Relation Age of Onset   Hyperlipidemia Mother    Hypertension Mother    Breast cancer Mother 17   Arthritis Father    Hyperlipidemia Father    Hypertension Father    Diabetes Father    Hypertension Brother    Arthritis Maternal Aunt    Hyperlipidemia Maternal Aunt    Hypertension Maternal Aunt    Alcohol abuse Maternal Uncle    Arthritis Maternal Uncle     Hyperlipidemia Maternal Uncle    Hypertension Maternal Uncle    Arthritis Paternal Aunt    Hyperlipidemia Paternal Aunt    Hypertension Paternal Aunt    Arthritis Paternal Uncle    Hyperlipidemia Paternal Uncle    Hypertension Paternal Uncle    Arthritis Maternal Grandmother    Hyperlipidemia Maternal Grandmother    Hypertension Maternal Grandmother    Arthritis Maternal Grandfather    Lung cancer Maternal Grandfather    Prostate cancer Maternal Grandfather    Hyperlipidemia Maternal Grandfather    Stroke Maternal Grandfather    Hypertension Maternal Grandfather    Arthritis Paternal Grandmother    Hyperlipidemia Paternal Grandmother    Hypertension Paternal Grandmother    Alcohol abuse Paternal Grandfather    Arthritis Paternal Grandfather    Hyperlipidemia Paternal Grandfather    Hypertension Paternal Grandfather    Sudden death Paternal Grandfather    Family Psychiatric  History: See H & P Social History:  Social History   Substance and Sexual Activity  Alcohol Use No   Comment: sober since 10/27/2010     Social History   Substance and Sexual Activity  Drug Use No    Social History   Socioeconomic History   Marital status: Single    Spouse name: Not on file   Number of  children: Not on file   Years of education: Not on file   Highest education level: Not on file  Occupational History   Not on file  Tobacco Use   Smoking status: Never   Smokeless tobacco: Never  Vaping Use   Vaping Use: Never used  Substance and Sexual Activity   Alcohol use: No    Comment: sober since 10/27/2010   Drug use: No   Sexual activity: Not on file  Other Topics Concern   Not on file  Social History Narrative   Not on file   Social Determinants of Health   Financial Resource Strain: Not on file  Food Insecurity: No Food Insecurity (04/06/2023)   Hunger Vital Sign    Worried About Running Out of Food in the Last Year: Never true    Ran Out of Food in the Last Year: Never  true  Transportation Needs: No Transportation Needs (04/06/2023)   PRAPARE - Administrator, Civil Service (Medical): No    Lack of Transportation (Non-Medical): No  Physical Activity: Not on file  Stress: Not on file  Social Connections: Not on file   Physical Findings: AIMS:  , ,  ,  ,    CIWA:  CIWA-Ar Total: 1 COWS:     Musculoskeletal: Strength & Muscle Tone: within normal limits Gait & Station: normal Patient leans: N/A   Psychiatric Specialty Exam:  Presentation  General Appearance:  Appropriate for Environment; Fairly Groomed  Eye Contact: Fair  Speech: Clear and Coherent  Speech Volume: Normal  Handedness: Right   Mood and Affect  Mood: Euthymic  Affect: Appropriate; Congruent   Thought Process  Thought Processes: Coherent  Descriptions of Associations:Intact  Orientation:Full (Time, Place and Person)  Thought Content:Logical  History of Schizophrenia/Schizoaffective disorder:No  Duration of Psychotic Symptoms:No data recorded Hallucinations:No data recorded Ideas of Reference:None  Suicidal Thoughts:No data recorded Homicidal Thoughts:No data recorded  Sensorium  Memory: Immediate Good  Judgment: Fair  Insight: Fair   Art therapist  Concentration: Good  Attention Span: Good  Recall: Fair  Fund of Knowledge: Fair  Language: Fair   Psychomotor Activity  Psychomotor Activity:No data recorded  Assets  Assets: Communication Skills   Sleep  Sleep:No data recorded   Physical Exam: Physical Exam Review of Systems  Psychiatric/Behavioral:  Positive for depression (Denies SI/HI/AVH, contracts for safety outside of [place]) and substance abuse. Negative for hallucinations, memory loss and suicidal ideas. The patient is nervous/anxious (resolving) and has insomnia (resolving).    Blood pressure 111/67, pulse 78, temperature 98.6 F (37 C), temperature source Oral, resp. rate 16, height 5'  4" (1.626 m), weight 65.8 kg, SpO2 100 %. Body mass index is 24.89 kg/m.   Social History   Tobacco Use  Smoking Status Never  Smokeless Tobacco Never   Tobacco Cessation:  N/A, patient does not currently use tobacco products   Blood Alcohol level:  Lab Results  Component Value Date   ETH 307 (HH) 04/03/2023    Metabolic Disorder Labs:  Lab Results  Component Value Date   HGBA1C 5.7 (H) 04/08/2023   MPG 116.89 04/08/2023   No results found for: "PROLACTIN" Lab Results  Component Value Date   CHOL 226 (H) 04/08/2023   TRIG 149 04/08/2023   HDL 59 04/08/2023   CHOLHDL 3.8 04/08/2023   VLDL 30 04/08/2023   LDLCALC 137 (H) 04/08/2023    See Psychiatric Specialty Exam and Suicide Risk Assessment completed by Attending Physician prior to discharge.  Discharge destination:  Home  Is patient on multiple antipsychotic therapies at discharge:  No   Has Patient had three or more failed trials of antipsychotic monotherapy by history:  No  Recommended Plan for Multiple Antipsychotic Therapies: NA   Allergies as of 04/13/2023       Reactions   Aleve [naproxen Sodium] Hives        Medication List     STOP taking these medications    acyclovir 400 MG tablet Commonly known as: ZOVIRAX   chlordiazePOXIDE 25 MG capsule Commonly known as: LIBRIUM   folic acid 1 MG tablet Commonly known as: FOLVITE   LORazepam 1 MG tablet Commonly known as: ATIVAN   thiamine 100 MG tablet Commonly known as: Vitamin B-1   traZODone 50 MG tablet Commonly known as: DESYREL   venlafaxine XR 75 MG 24 hr capsule Commonly known as: EFFEXOR-XR       TAKE these medications      Indication  DULoxetine 30 MG capsule Commonly known as: CYMBALTA Take 3 capsules (90 mg total) by mouth daily.  Indication: Generalized Anxiety Disorder, Major Depressive Disorder   gabapentin 300 MG capsule Commonly known as: NEURONTIN Take 1 capsule (300 mg total) by mouth 3 (three) times  daily.  Indication: Abuse or Misuse of Alcohol   hydrOXYzine 50 MG tablet Commonly known as: ATARAX Take 1 tablet (50 mg total) by mouth 3 (three) times daily as needed for anxiety.  Indication: Feeling Anxious   hydrOXYzine 50 MG tablet Commonly known as: ATARAX Take 1 tablet (50 mg total) by mouth 3 (three) times daily as needed for anxiety.  Indication: Feeling Anxious   pantoprazole 40 MG tablet Commonly known as: PROTONIX Take 1 tablet (40 mg total) by mouth daily.  Indication: Excess Stomach Secretions        Follow-up Information     [facility] Go on 04/15/2023.   Why: appointment 1pm.  Please arrive 15 minutes early with your identification, discharge summary and insurance card. Contact information: [address] [place] [address] [phone]                Signed: Starleen Blue, NP 04/25/2023, 5:18 PM

## 2023-04-27 ENCOUNTER — Encounter: Payer: Self-pay | Admitting: Internal Medicine

## 2023-04-27 NOTE — Assessment & Plan Note (Signed)
On protonix

## 2023-04-27 NOTE — Assessment & Plan Note (Signed)
Recent hospitalization as outlined.  Currently on cymbalta.  Not taking trazodone.  Does not feel needs.  Add buspar to take scheduled bid.  Has hydroxyzine to take prn.  No SI. Discussed with her.  States she does not want to harm herself.  Agreeable to psychiatry f/u.  Called psychiatry. Appt made for tomorrow.

## 2023-04-27 NOTE — Assessment & Plan Note (Signed)
Recent hospitalization as outlined.  Had bene sober for 13 years.  No alcohol since discharge. Has not desire to restart.  Discussed psychiatry referral.  Agreeable.  Has supportive family.

## 2023-04-27 NOTE — Assessment & Plan Note (Signed)
Increased stress and anxiety as outlined.  Recent hospitalization as outlined.  Currently on cymbalta 90mg  q day.  Add buspar as directed.  Have hydroxyzine to take prn.  Contacted psychiatry.  Work in Nutritional therapist.  Pt agreeable for f/u.

## 2023-04-27 NOTE — Assessment & Plan Note (Signed)
Low cholesterol diet and exercise.  Follow lipid panel.   

## 2023-04-29 ENCOUNTER — Ambulatory Visit
Admission: RE | Admit: 2023-04-29 | Discharge: 2023-04-29 | Disposition: A | Discharge status: Home / Self Care | Payer: BC Managed Care – PPO | Source: Ambulatory Visit | Attending: Internal Medicine | Admitting: Internal Medicine

## 2023-04-29 ENCOUNTER — Ambulatory Visit
Admission: RE | Admit: 2023-04-29 | Discharge: 2023-04-29 | Disposition: A | Discharge status: Home / Self Care | Payer: BC Managed Care – PPO | Attending: Internal Medicine | Admitting: Internal Medicine

## 2023-04-29 DIAGNOSIS — M25552 Pain in left hip: Secondary | ICD-10-CM | POA: Diagnosis not present

## 2023-04-29 DIAGNOSIS — M1612 Unilateral primary osteoarthritis, left hip: Secondary | ICD-10-CM | POA: Diagnosis not present

## 2023-05-06 ENCOUNTER — Telehealth: Payer: Self-pay | Admitting: *Deleted

## 2023-05-06 NOTE — Telephone Encounter (Signed)
Spoke with patient. She is agreeable to PT. Will discuss with Dr Lorin Picket about placing referral. While on the phone patient is wanting to know what she can do for the pain for now. She has tried tylenol extra strength and tylenol arthritis with no relief. She was asking for something to be called in. Advised that Dr Lorin Picket is out of the office and typically we do not prescribe pain medication without evaluation. Any other recommendations of things that she could use/try? Per patient, pain is worse when she has a busy day and her uncle just passed away so she has been busier than normal trying to help her family get arrangements taken care of.

## 2023-05-06 NOTE — Telephone Encounter (Signed)
-----   Message from Dale [place], MD sent at 05/06/2023  7:36 AM EDT ----- Please call and notify - hip xray reveals changes in the hip that are consistent with arthritis.  No fracture or dislocation.  Given persistent pain and limited rom and xray findings, I would like to refer her to PT for further evaluation and treatment.

## 2023-05-06 NOTE — Telephone Encounter (Signed)
Left message to call office

## 2023-05-06 NOTE — Telephone Encounter (Signed)
Pt returned [employer]. Note below was read to pt. Pt aware and understood.  As per pt, what can she do now for the pain? Can we sent in some meds for the mean time to her pharmacy??   Pt also stated that she's very active, and she's not sure if PT will help since everytime she works out or move around the pain get worst.

## 2023-05-06 NOTE — Telephone Encounter (Signed)
Spoke with patient. She was told not to take ibuprofen with her other medications. She is going to try alternating heat and ice and SalonPas patches to see if this helps. Advised to continue tylenol as well.

## 2023-05-07 ENCOUNTER — Other Ambulatory Visit: Payer: Self-pay | Admitting: Internal Medicine

## 2023-05-07 DIAGNOSIS — M25552 Pain in left hip: Secondary | ICD-10-CM

## 2023-05-07 NOTE — Telephone Encounter (Signed)
Agree with acute care evaluation given increased pain despite OTC suggestions.

## 2023-05-07 NOTE — Progress Notes (Signed)
Order placed for PT referral.  

## 2023-05-07 NOTE — Telephone Encounter (Signed)
Patient called and wanted to speak to [place]. Bethann Berkshire was working with another provider but is aware of the issues patient is having. Bethann Berkshire advised patient to go to Urgent care for the pain.

## 2023-05-08 DIAGNOSIS — F419 Anxiety disorder, unspecified: Secondary | ICD-10-CM | POA: Diagnosis not present

## 2023-05-08 DIAGNOSIS — F4002 Agoraphobia without panic disorder: Secondary | ICD-10-CM | POA: Diagnosis not present

## 2023-05-08 DIAGNOSIS — F331 Major depressive disorder, recurrent, moderate: Secondary | ICD-10-CM | POA: Diagnosis not present

## 2023-05-08 DIAGNOSIS — F1310 Sedative, hypnotic or anxiolytic abuse, uncomplicated: Secondary | ICD-10-CM | POA: Diagnosis not present

## 2023-05-08 DIAGNOSIS — F411 Generalized anxiety disorder: Secondary | ICD-10-CM | POA: Diagnosis not present

## 2023-05-13 ENCOUNTER — Other Ambulatory Visit: Payer: Self-pay | Admitting: Internal Medicine

## 2023-05-14 NOTE — Telephone Encounter (Signed)
Please call her.  She is seeing psychiatry now and needs to get all of these meds through psychiatry.

## 2023-06-09 ENCOUNTER — Ambulatory Visit: Payer: BC Managed Care – PPO | Admitting: Internal Medicine

## 2023-06-13 ENCOUNTER — Ambulatory Visit: Payer: BC Managed Care – PPO | Admitting: Internal Medicine

## 2023-06-17 DIAGNOSIS — F411 Generalized anxiety disorder: Secondary | ICD-10-CM | POA: Diagnosis not present

## 2023-06-17 DIAGNOSIS — F4002 Agoraphobia without panic disorder: Secondary | ICD-10-CM | POA: Diagnosis not present

## 2023-06-17 DIAGNOSIS — F331 Major depressive disorder, recurrent, moderate: Secondary | ICD-10-CM | POA: Diagnosis not present

## 2023-06-17 DIAGNOSIS — F1310 Sedative, hypnotic or anxiolytic abuse, uncomplicated: Secondary | ICD-10-CM | POA: Diagnosis not present

## 2023-06-20 ENCOUNTER — Ambulatory Visit (INDEPENDENT_AMBULATORY_CARE_PROVIDER_SITE_OTHER): Payer: BC Managed Care – PPO | Admitting: Internal Medicine

## 2023-06-20 VITALS — BP 130/86 | HR 90 | Resp 16 | Ht 64.0 in | Wt 155.0 lb

## 2023-06-20 DIAGNOSIS — F332 Major depressive disorder, recurrent severe without psychotic features: Secondary | ICD-10-CM

## 2023-06-20 DIAGNOSIS — F1029 Alcohol dependence with unspecified alcohol-induced disorder: Secondary | ICD-10-CM

## 2023-06-20 DIAGNOSIS — E7800 Pure hypercholesterolemia, unspecified: Secondary | ICD-10-CM

## 2023-06-20 DIAGNOSIS — F419 Anxiety disorder, unspecified: Secondary | ICD-10-CM

## 2023-06-20 DIAGNOSIS — R110 Nausea: Secondary | ICD-10-CM

## 2023-06-20 DIAGNOSIS — Z86010 Personal history of colonic polyps: Secondary | ICD-10-CM

## 2023-06-20 DIAGNOSIS — M791 Myalgia, unspecified site: Secondary | ICD-10-CM | POA: Diagnosis not present

## 2023-06-20 DIAGNOSIS — Z136 Encounter for screening for cardiovascular disorders: Secondary | ICD-10-CM

## 2023-06-20 DIAGNOSIS — R739 Hyperglycemia, unspecified: Secondary | ICD-10-CM | POA: Diagnosis not present

## 2023-06-20 DIAGNOSIS — K219 Gastro-esophageal reflux disease without esophagitis: Secondary | ICD-10-CM

## 2023-06-20 DIAGNOSIS — E538 Deficiency of other specified B group vitamins: Secondary | ICD-10-CM

## 2023-06-20 DIAGNOSIS — Z1211 Encounter for screening for malignant neoplasm of colon: Secondary | ICD-10-CM

## 2023-06-20 LAB — BASIC METABOLIC PANEL
BUN: 11 mg/dL (ref 6–23)
CO2: 27 mEq/L (ref 19–32)
Calcium: 9.6 mg/dL (ref 8.4–10.5)
Chloride: 105 mEq/L (ref 96–112)
Creatinine, Ser: 0.76 mg/dL (ref 0.40–1.20)
GFR: 88.63 mL/min (ref >60.00)
Glucose, Bld: 95 mg/dL (ref 70–99)
Potassium: 4.2 mEq/L (ref 3.5–5.1)
Sodium: 141 mEq/L (ref 135–145)

## 2023-06-20 LAB — HEPATIC FUNCTION PANEL
ALT: 11 U/L (ref 0–35)
AST: 14 U/L (ref 0–37)
Albumin: 4.4 g/dL (ref 3.5–5.2)
Alkaline Phosphatase: 56 U/L (ref 39–117)
Bilirubin, Direct: 0.1 mg/dL (ref 0.0–0.3)
Total Bilirubin: 0.3 mg/dL (ref 0.2–1.2)
Total Protein: 7 g/dL (ref 6.0–8.3)

## 2023-06-20 LAB — VITAMIN B12: Vitamin B-12: 316 pg/mL (ref 211–911)

## 2023-06-20 LAB — CBC WITH DIFFERENTIAL/PLATELET
Basophils Absolute: 0.1 10*3/uL (ref 0.0–0.1)
Basophils Relative: 1 % (ref 0.0–3.0)
Eosinophils Absolute: 0.1 10*3/uL (ref 0.0–0.7)
Eosinophils Relative: 1.7 % (ref 0.0–5.0)
HCT: 43 % (ref 36.0–46.0)
Hemoglobin: 14 g/dL (ref 12.0–15.0)
Lymphocytes Relative: 39.9 % (ref 12.0–46.0)
Lymphs Abs: 2.5 10*3/uL (ref 0.7–4.0)
MCHC: 32.5 g/dL (ref 30.0–36.0)
MCV: 89.8 fl (ref 78.0–100.0)
Monocytes Absolute: 0.5 10*3/uL (ref 0.1–1.0)
Monocytes Relative: 7.7 % (ref 3.0–12.0)
Neutro Abs: 3.1 10*3/uL (ref 1.4–7.7)
Neutrophils Relative %: 49.7 % (ref 43.0–77.0)
Platelets: 305 10*3/uL (ref 150.0–400.0)
RBC: 4.79 Mil/uL (ref 3.87–5.11)
RDW: 14.9 % (ref 11.5–15.5)
WBC: 6.2 10*3/uL (ref 4.0–10.5)

## 2023-06-20 LAB — CK: Total CK: 29 U/L (ref 7–177)

## 2023-06-20 LAB — SEDIMENTATION RATE: Sed Rate: 13 mm/hr (ref 0–30)

## 2023-06-20 LAB — C-REACTIVE PROTEIN: CRP: <1 mg/dL (ref 0.5–20.0)

## 2023-06-20 NOTE — Progress Notes (Unsigned)
Subjective:    Patient ID: Tracy Phillips, female    DOB: Jun 21, 1966, 57 y.o.   MRN: [id number]  Patient here for  Chief Complaint  Patient presents with   Medical Management of Chronic Issues    HPI Here for a scheduled follow up.  Here to follow up regarding GERD and hypercholesterolemia. Recently admitted - alcohol intoxication.  Also has ben having problems with increased anxiety.  Seeing psychiatry. Now on cymbalta, celexa and seroquel.  Still trying to adjust medication.  Still with increased anxiety.  Also reports muscle aches.  Reports aching all over - specifically back muscles and muscles in her legs.  Reports that if she sweats at night, when she wakes up - muscles are better.  She may feel better for a couple of hours and then muscle aches return.  She also reports nausea.  No change with food.  Some decreased appetite.  Intermittent diarrhea.  Some bloating and gas. Some acid reflux.  Breathing stable.    Past Medical History:  Diagnosis Date   Depression    H/O dizziness    H/O eating disorder    H/O: alcohol abuse    Herpes    H/O   History of chicken pox    History of colon polyps    Hyperlipidemia    Hypertension    Kidney stones    H/O stones   Past Surgical History:  Procedure Laterality Date   ABDOMINAL HYSTERECTOMY  1998   APPENDECTOMY  1997   DILATION AND CURETTAGE OF UTERUS     TONSILLECTOMY  1997   Family History  Problem Relation Age of Onset   Hyperlipidemia Mother    Hypertension Mother    Breast cancer Mother 14   Arthritis Father    Hyperlipidemia Father    Hypertension Father    Diabetes Father    Hypertension Brother    Arthritis Maternal Aunt    Hyperlipidemia Maternal Aunt    Hypertension Maternal Aunt    Alcohol abuse Maternal Uncle    Arthritis Maternal Uncle    Hyperlipidemia Maternal Uncle    Hypertension Maternal Uncle    Arthritis Paternal Aunt    Hyperlipidemia Paternal Aunt    Hypertension Paternal Aunt    Arthritis  Paternal Uncle    Hyperlipidemia Paternal Uncle    Hypertension Paternal Uncle    Arthritis Maternal Grandmother    Hyperlipidemia Maternal Grandmother    Hypertension Maternal Grandmother    Arthritis Maternal Grandfather    Lung cancer Maternal Grandfather    Prostate cancer Maternal Grandfather    Hyperlipidemia Maternal Grandfather    Stroke Maternal Grandfather    Hypertension Maternal Grandfather    Arthritis Paternal Grandmother    Hyperlipidemia Paternal Grandmother    Hypertension Paternal Grandmother    Alcohol abuse Paternal Grandfather    Arthritis Paternal Grandfather    Hyperlipidemia Paternal Grandfather    Hypertension Paternal Grandfather    Sudden death Paternal Grandfather    Social History   Socioeconomic History   Marital status: Single    Spouse name: Not on file   Number of children: Not on file   Years of education: Not on file   Highest education level: Not on file  Occupational History   Not on file  Tobacco Use   Smoking status: Never   Smokeless tobacco: Never  Vaping Use   Vaping status: Never Used  Substance and Sexual Activity   Alcohol use: No    Comment: sober  since 10/27/2010   Drug use: No   Sexual activity: Not on file  Other Topics Concern   Not on file  Social History Narrative   Not on file   Social Determinants of Health   Financial Resource Strain: Not on file  Food Insecurity: No Food Insecurity (04/06/2023)   Hunger Vital Sign    Worried About Running Out of Food in the Last Year: Never true    Ran Out of Food in the Last Year: Never true  Transportation Needs: No Transportation Needs (04/06/2023)   PRAPARE - Administrator, Civil Service (Medical): No    Lack of Transportation (Non-Medical): No  Physical Activity: Not on file  Stress: Not on file  Social Connections: Not on file     Review of Systems  Constitutional:  Negative for appetite change and unexpected weight change.  HENT:  Negative for  congestion and sinus pressure.   Respiratory:  Negative for cough, chest tightness and shortness of breath.   Cardiovascular:  Negative for chest pain, palpitations and leg swelling.  Gastrointestinal:  Positive for nausea. Negative for vomiting.       Abdominal bloating.   Genitourinary:  Negative for difficulty urinating and dysuria.  Musculoskeletal:  Negative for joint swelling.       Muscle aches as outlined.   Skin:  Negative for color change and rash.  Neurological:  Negative for dizziness and headaches.  Psychiatric/Behavioral:         Increased anxiety as outlined.        Objective:     BP 130/86   Pulse 90   Resp 16   Ht 5\' 4"  (1.626 m)   Wt 155 lb (70.3 kg)   SpO2 98%   BMI 26.61 kg/m  Wt Readings from Last 3 Encounters:  06/20/23 155 lb (70.3 kg)  04/21/23 151 lb (68.5 kg)  04/04/23 145 lb (65.8 kg)    Physical Exam Vitals reviewed.  Constitutional:      General: She is not in acute distress.    Appearance: Normal appearance.  HENT:     Head: Normocephalic and atraumatic.     Right Ear: External ear normal.     Left Ear: External ear normal.  Eyes:     General: No scleral icterus.       Right eye: No discharge.        Left eye: No discharge.     Conjunctiva/sclera: Conjunctivae normal.  Neck:     Thyroid: No thyromegaly.  Cardiovascular:     Rate and Rhythm: Normal rate and regular rhythm.  Pulmonary:     Effort: No respiratory distress.     Breath sounds: Normal breath sounds. No wheezing.  Abdominal:     General: Bowel sounds are normal.     Palpations: Abdomen is soft.     Tenderness: There is no abdominal tenderness.  Musculoskeletal:        General: No swelling or tenderness.     Cervical back: Neck supple. No tenderness.  Lymphadenopathy:     Cervical: No cervical adenopathy.  Skin:    Findings: No erythema or rash.  Neurological:     Mental Status: She is alert.  Psychiatric:        Mood and Affect: Mood normal.        Behavior:  Behavior normal.      Outpatient Encounter Medications as of 06/20/2023  Medication Sig   citalopram (CELEXA) 20 MG tablet Take 20 mg  by mouth daily.   QUEtiapine (SEROQUEL) 100 MG tablet Take 100 mg by mouth at bedtime.   bisacodyl (DULCOLAX) 10 MG suppository Use one per rectum bid prn   DULoxetine (CYMBALTA) 30 MG capsule Take 3 capsules (90 mg total) by mouth daily.   pantoprazole (PROTONIX) 40 MG tablet Take 1 tablet (40 mg total) by mouth daily.   [DISCONTINUED] busPIRone (BUSPAR) 10 MG tablet Take 1 tablet (10 mg total) by mouth 2 (two) times daily.   [DISCONTINUED] gabapentin (NEURONTIN) 300 MG capsule Take 1 capsule (300 mg total) by mouth 3 (three) times daily.   [DISCONTINUED] hydrOXYzine (ATARAX) 50 MG tablet Take 1 tablet (50 mg total) by mouth 3 (three) times daily as needed for anxiety.   [DISCONTINUED] hydrOXYzine (ATARAX) 50 MG tablet Take 1 tablet (50 mg total) by mouth 3 (three) times daily as needed for anxiety.   No facility-administered encounter medications on file as of 06/20/2023.     Lab Results  Component Value Date   WBC 6.2 06/20/2023   HGB 14.0 06/20/2023   HCT 43.0 06/20/2023   PLT 305.0 06/20/2023   GLUCOSE 95 06/20/2023   CHOL 226 (H) 04/08/2023   TRIG 149 04/08/2023   HDL 59 04/08/2023   LDLDIRECT 132.0 05/13/2019   LDLCALC 137 (H) 04/08/2023   ALT 11 06/20/2023   AST 14 06/20/2023   NA 141 06/20/2023   K 4.2 06/20/2023   CL 105 06/20/2023   CREATININE 0.76 06/20/2023   BUN 11 06/20/2023   CO2 27 06/20/2023   TSH 2.288 04/08/2023   HGBA1C 5.7 (H) 04/08/2023    DG Hip Unilat W OR W/O Pelvis 2-3 Views Left  Result Date: 05/05/2023 CLINICAL DATA:  Chronic left hip pain for 3 years with limited range of motion. No known trauma EXAM: DG HIP (WITH OR WITHOUT PELVIS) 2-3V LEFT COMPARISON:  None available FINDINGS: No fracture or dislocation.  Soft tissues are normal. Moderate spurring, joint space loss, subchondral sclerosis and cystic changes of  the left hip. IMPRESSION: Moderate left hip osteoarthrosis. Electronically Signed   By: Acquanetta Belling M.D.   On: 05/05/2023 14:47       Assessment & Plan:  Hypercholesterolemia Assessment & Plan: Low cholesterol diet and exercise.  Follow lipid panel.   Orders: -     CBC with Differential/Platelet -     Basic metabolic panel -     Hepatic function panel  Hyperglycemia Assessment & Plan: Low carb diet and exercise.  Follow met b and a1c.    Muscle ache Assessment & Plan: With increased muscle aches as outlined.  Unclear etiology.  No focal motor deficits noted.  Check routine labs and also check CK and ESR/CRP.  Stretches/exercise.  Follow.   Orders: -     Sedimentation rate -     CK -     C-reactive protein -     ANA -     Rheumatoid factor  B12 deficiency Assessment & Plan: Recheck b12 level today.  History of deficiency.   Orders: -     Vitamin B12  Colon cancer screening -     Ambulatory referral to Gastroenterology  Gastroesophageal reflux disease, unspecified whether esophagitis present Assessment & Plan: Acid reflux. Protonix.   Orders: -     Ambulatory referral to Gastroenterology  Encounter for screening for coronary artery disease Assessment & Plan: Strong family history. Discussed screening CT - calcium score.  Agreeable.   Orders: -     CT CARDIAC  SCORING (SELF PAY ONLY); Future  Alcohol dependence with unspecified alcohol-induced disorder [facility]) Assessment & Plan: Recent hospitalization as outlined.  No alcohol since discharge. Seeing psychiatry.  Has supportive family.    Anxiety Assessment & Plan: Seeing psychiatry.  Currently on cymbalta, seroquel and celexa.  Still adjusting medication.  Continues f/u with psychiatry.    Severe episode of recurrent major depressive disorder, without psychotic features [facility]) Assessment & Plan: Seeing psychiatry.  Currently on cymbalta, citalopram and seroquel.  Continuing to adjust medication.  Overall  appears to be doing better.  Follow.    Personal history of colonic polyps Assessment & Plan: Has a history of polyps.  Has been >10 years since last colonoscopy (per pt).  Refer to GI.     Nausea Assessment & Plan: Reported nausea.  Check labs as outlined.  Protonix.  Monitor for triggers.  Treat anxiety.  GI referral as outlined.       Dale , MD

## 2023-06-22 ENCOUNTER — Encounter: Payer: Self-pay | Admitting: Internal Medicine

## 2023-06-22 DIAGNOSIS — R112 Nausea with vomiting, unspecified: Secondary | ICD-10-CM | POA: Insufficient documentation

## 2023-06-22 DIAGNOSIS — Z136 Encounter for screening for cardiovascular disorders: Secondary | ICD-10-CM | POA: Insufficient documentation

## 2023-06-22 DIAGNOSIS — R110 Nausea: Secondary | ICD-10-CM | POA: Insufficient documentation

## 2023-06-22 NOTE — Assessment & Plan Note (Signed)
Acid reflux. Protonix.

## 2023-06-22 NOTE — Assessment & Plan Note (Signed)
Recent hospitalization as outlined.  No alcohol since discharge. Seeing psychiatry.  Has supportive family.

## 2023-06-22 NOTE — Assessment & Plan Note (Signed)
Strong family history. Discussed screening CT - calcium score.  Agreeable.

## 2023-06-22 NOTE — Assessment & Plan Note (Signed)
Seeing psychiatry.  Currently on cymbalta, seroquel and celexa.  Still adjusting medication.  Continues f/u with psychiatry.

## 2023-06-22 NOTE — Assessment & Plan Note (Signed)
Low cholesterol diet and exercise.  Follow lipid panel.   

## 2023-06-22 NOTE — Assessment & Plan Note (Signed)
Low carb diet and exercise.  Follow met b and a1c.  

## 2023-06-22 NOTE — Assessment & Plan Note (Signed)
With increased muscle aches as outlined.  Unclear etiology.  No focal motor deficits noted.  Check routine labs and also check CK and ESR/CRP.  Stretches/exercise.  Follow.

## 2023-06-22 NOTE — Assessment & Plan Note (Signed)
Reported nausea.  Check labs as outlined.  Protonix.  Monitor for triggers.  Treat anxiety.  GI referral as outlined.

## 2023-06-22 NOTE — Assessment & Plan Note (Signed)
Has a history of polyps.  Has been >10 years since last colonoscopy (per pt).  Refer to GI.

## 2023-06-22 NOTE — Assessment & Plan Note (Signed)
Recheck b12 level today.  History of deficiency.

## 2023-06-22 NOTE — Assessment & Plan Note (Signed)
Seeing psychiatry.  Currently on cymbalta, citalopram and seroquel.  Continuing to adjust medication.  Overall appears to be doing better.  Follow.

## 2023-06-23 ENCOUNTER — Telehealth: Payer: Self-pay

## 2023-06-23 NOTE — Telephone Encounter (Signed)
Lvm for pt to call back, ok to give results

## 2023-06-23 NOTE — Telephone Encounter (Signed)
-----   Message from [place] sent at 06/22/2023  9:02 AM EDT ----- Please call and notify - rheumatoid factor, inflammation markers, muscle test - are all wnl. White blood cell count, hgb, kidney function tests and liver function tests are all wnl. B12 level is wnl, but at lower end of normal.  I would like for her to start oral B12 q day.  If persistent muscle/joint aches - can consider rheumatology referral.  Can let me know.

## 2023-06-24 ENCOUNTER — Telehealth: Payer: Self-pay

## 2023-06-24 NOTE — Telephone Encounter (Signed)
-----   Message from [place] sent at 06/23/2023 11:35 PM EDT ----- See other result note (for other lab results) and see me before calling. ANA is positive.  This is non specific.  Given multiple joint pains, I would like to refer her to rheumatology for further evaluation and treatment.

## 2023-06-27 ENCOUNTER — Ambulatory Visit: Payer: BC Managed Care – PPO | Attending: Internal Medicine

## 2023-07-01 ENCOUNTER — Telehealth: Payer: Self-pay

## 2023-07-01 DIAGNOSIS — M2550 Pain in unspecified joint: Secondary | ICD-10-CM

## 2023-07-01 NOTE — Telephone Encounter (Signed)
-----   Message from [place] sent at 06/22/2023  9:02 AM EDT ----- Please call and notify - rheumatoid factor, inflammation markers, muscle test - are all wnl. White blood cell count, hgb, kidney function tests and liver function tests are all wnl. B12 level is wnl, but at lower end of normal.  I would like for her to start oral B12 q day.  If persistent muscle/joint aches - can consider rheumatology referral.  Can let me know.

## 2023-07-03 NOTE — Addendum Note (Signed)
Addended by: Charm Barges on: 07/03/2023 08:41 PM   Modules accepted: Orders

## 2023-07-03 NOTE — Telephone Encounter (Signed)
Order placed for rheumatology referral.  

## 2023-07-03 NOTE — Telephone Encounter (Signed)
Patient agreeable to see rheumatology. Send referral to [facility] rheumatology?

## 2023-07-03 NOTE — Telephone Encounter (Signed)
Patient called and note was read. Patient would like a rheumatology referral.

## 2023-07-16 ENCOUNTER — Telehealth: Payer: Self-pay | Admitting: Internal Medicine

## 2023-07-16 NOTE — Telephone Encounter (Signed)
Pt called in stating that she's been nauseas for the past 2 weeks, after she was in the hospital. Pt stated she would like to speak to a nurse concerning this. Pt was transferred to access nurse.

## 2023-07-17 MED ORDER — ONDANSETRON HCL 4 MG PO TABS: 4.0000 mg | ORAL_TABLET | Freq: Two times a day (BID) | ORAL | 0 refills | Status: DC | PRN

## 2023-07-17 NOTE — Telephone Encounter (Signed)
I can send in rx for zofran 4mg  q 12 hours prn, #14 with no refills, but if she is having persistent intermittent nausea, she needs to be seen.  Also, needs to keep appt with psych

## 2023-07-17 NOTE — Telephone Encounter (Signed)
Patient says she has had nausea for the last couple of weeks. No other acute symptoms. No abdominal pain, diarrhea, vomiting, fever etc. It does effect her appetite a little but still able to eat. Still working with psych to get her medications adjusted to get a regimen that is good for her- seeing them again next week. She is wondering if she could have something to use prn for nausea to see if it will help her.

## 2023-07-17 NOTE — Telephone Encounter (Signed)
Zofran sent to pharmacy. Pt will let me know if sx persist so I can set her up for appt.

## 2023-07-17 NOTE — Telephone Encounter (Signed)
LM to follow up with patient.

## 2023-07-17 NOTE — Telephone Encounter (Signed)
Patient is aware of below. 

## 2023-09-10 ENCOUNTER — Inpatient Hospital Stay: Admit: 2023-09-10 | Payer: BC Managed Care – PPO

## 2023-09-10 ENCOUNTER — Inpatient Hospital Stay: Payer: BC Managed Care – PPO

## 2023-09-10 ENCOUNTER — Inpatient Hospital Stay
Admission: EM | Admit: 2023-09-10 | Discharge: 2023-09-13 | DRG: 918 | Disposition: A | Discharge status: Home / Self Care | Payer: BC Managed Care – PPO | Attending: Hospitalist | Admitting: Hospitalist

## 2023-09-10 ENCOUNTER — Other Ambulatory Visit: Payer: Self-pay

## 2023-09-10 DIAGNOSIS — F1021 Alcohol dependence, in remission: Secondary | ICD-10-CM | POA: Diagnosis present

## 2023-09-10 DIAGNOSIS — Z8619 Personal history of other infectious and parasitic diseases: Secondary | ICD-10-CM | POA: Diagnosis not present

## 2023-09-10 DIAGNOSIS — R110 Nausea: Secondary | ICD-10-CM | POA: Diagnosis not present

## 2023-09-10 DIAGNOSIS — I214 Non-ST elevation (NSTEMI) myocardial infarction: Secondary | ICD-10-CM | POA: Diagnosis not present

## 2023-09-10 DIAGNOSIS — Z87442 Personal history of urinary calculi: Secondary | ICD-10-CM | POA: Diagnosis not present

## 2023-09-10 DIAGNOSIS — M6282 Rhabdomyolysis: Secondary | ICD-10-CM | POA: Diagnosis present

## 2023-09-10 DIAGNOSIS — E8721 Acute metabolic acidosis: Secondary | ICD-10-CM | POA: Diagnosis present

## 2023-09-10 DIAGNOSIS — I2489 Other forms of acute ischemic heart disease: Secondary | ICD-10-CM

## 2023-09-10 DIAGNOSIS — F32A Depression, unspecified: Secondary | ICD-10-CM | POA: Diagnosis present

## 2023-09-10 DIAGNOSIS — F419 Anxiety disorder, unspecified: Secondary | ICD-10-CM | POA: Diagnosis not present

## 2023-09-10 DIAGNOSIS — F1090 Alcohol use, unspecified, uncomplicated: Secondary | ICD-10-CM | POA: Diagnosis present

## 2023-09-10 DIAGNOSIS — R7989 Other specified abnormal findings of blood chemistry: Secondary | ICD-10-CM | POA: Diagnosis not present

## 2023-09-10 DIAGNOSIS — Z90710 Acquired absence of both cervix and uterus: Secondary | ICD-10-CM

## 2023-09-10 DIAGNOSIS — R0689 Other abnormalities of breathing: Secondary | ICD-10-CM | POA: Diagnosis not present

## 2023-09-10 DIAGNOSIS — R457 State of emotional shock and stress, unspecified: Secondary | ICD-10-CM | POA: Diagnosis not present

## 2023-09-10 DIAGNOSIS — Z8249 Family history of ischemic heart disease and other diseases of the circulatory system: Secondary | ICD-10-CM | POA: Diagnosis not present

## 2023-09-10 DIAGNOSIS — F1020 Alcohol dependence, uncomplicated: Secondary | ICD-10-CM | POA: Diagnosis present

## 2023-09-10 DIAGNOSIS — Z8659 Personal history of other mental and behavioral disorders: Secondary | ICD-10-CM

## 2023-09-10 DIAGNOSIS — Z83438 Family history of other disorder of lipoprotein metabolism and other lipidemia: Secondary | ICD-10-CM

## 2023-09-10 DIAGNOSIS — R000 Tachycardia, unspecified: Secondary | ICD-10-CM | POA: Diagnosis present

## 2023-09-10 DIAGNOSIS — N179 Acute kidney failure, unspecified: Secondary | ICD-10-CM | POA: Insufficient documentation

## 2023-09-10 DIAGNOSIS — Z79899 Other long term (current) drug therapy: Secondary | ICD-10-CM | POA: Diagnosis not present

## 2023-09-10 DIAGNOSIS — I10 Essential (primary) hypertension: Secondary | ICD-10-CM | POA: Diagnosis not present

## 2023-09-10 DIAGNOSIS — R079 Chest pain, unspecified: Secondary | ICD-10-CM | POA: Diagnosis not present

## 2023-09-10 DIAGNOSIS — E785 Hyperlipidemia, unspecified: Secondary | ICD-10-CM | POA: Diagnosis not present

## 2023-09-10 DIAGNOSIS — F1410 Cocaine abuse, uncomplicated: Secondary | ICD-10-CM | POA: Diagnosis not present

## 2023-09-10 DIAGNOSIS — F1490 Cocaine use, unspecified, uncomplicated: Secondary | ICD-10-CM | POA: Diagnosis not present

## 2023-09-10 DIAGNOSIS — T405X1A Poisoning by cocaine, accidental (unintentional), initial encounter: Principal | ICD-10-CM | POA: Diagnosis present

## 2023-09-10 DIAGNOSIS — R0902 Hypoxemia: Secondary | ICD-10-CM | POA: Diagnosis not present

## 2023-09-10 DIAGNOSIS — Z886 Allergy status to analgesic agent status: Secondary | ICD-10-CM

## 2023-09-10 DIAGNOSIS — F1010 Alcohol abuse, uncomplicated: Secondary | ICD-10-CM | POA: Diagnosis present

## 2023-09-10 DIAGNOSIS — R112 Nausea with vomiting, unspecified: Secondary | ICD-10-CM | POA: Diagnosis present

## 2023-09-10 DIAGNOSIS — Z860100 Personal history of colon polyps, unspecified: Secondary | ICD-10-CM

## 2023-09-10 LAB — BASIC METABOLIC PANEL WITH GFR
Anion gap: 17 — ABNORMAL HIGH (ref 5–15)
BUN: 30 mg/dL — ABNORMAL HIGH (ref 6–20)
CO2: 18 mmol/L — ABNORMAL LOW (ref 22–32)
Calcium: 9.6 mg/dL (ref 8.9–10.3)
Chloride: 102 mmol/L (ref 98–111)
Creatinine, Ser: 1.34 mg/dL — ABNORMAL HIGH (ref 0.44–1.00)
GFR, Estimated: 47 mL/min — ABNORMAL LOW
Glucose, Bld: 142 mg/dL — ABNORMAL HIGH (ref 70–99)
Potassium: 4.5 mmol/L (ref 3.5–5.1)
Sodium: 137 mmol/L (ref 135–145)

## 2023-09-10 LAB — CBC
HCT: 44.1 % (ref 36.0–46.0)
Hemoglobin: 14.6 g/dL (ref 12.0–15.0)
MCH: 29.2 pg (ref 26.0–34.0)
MCHC: 33.1 g/dL (ref 30.0–36.0)
MCV: 88.2 fL (ref 80.0–100.0)
Platelets: 268 10*3/uL (ref 150–400)
RBC: 5 MIL/uL (ref 3.87–5.11)
RDW: 14.6 % (ref 11.5–15.5)
WBC: 17.2 10*3/uL — ABNORMAL HIGH (ref 4.0–10.5)
nRBC: 0 % (ref 0.0–0.2)

## 2023-09-10 LAB — TROPONIN I (HIGH SENSITIVITY)
Troponin I (High Sensitivity): 107 ng/L
Troponin I (High Sensitivity): 106 ng/L

## 2023-09-10 MED ORDER — LORAZEPAM 2 MG/ML IJ SOLN: 2.0000 mg | INTRAMUSCULAR | Status: DC | PRN

## 2023-09-10 MED ORDER — ORAL CARE MOUTH RINSE: 15.0000 mL | OROMUCOSAL | Status: DC | PRN

## 2023-09-10 MED ORDER — HEPARIN (PORCINE) 25000 UT/250ML-% IV SOLN
800.0000 [IU]/h | INTRAVENOUS | Status: DC
  Administered 2023-09-10: 800 [IU]/h via INTRAVENOUS
  Filled 2023-09-10: qty 250

## 2023-09-10 MED ORDER — DULOXETINE HCL 30 MG PO CPEP: 90.0000 mg | ORAL_CAPSULE | Freq: Every day | ORAL | Status: DC

## 2023-09-10 MED ORDER — LORAZEPAM 2 MG/ML IJ SOLN
0.0000 mg | Freq: Two times a day (BID) | INTRAMUSCULAR | Status: DC
  Filled 2023-09-10 (×2): qty 1

## 2023-09-10 MED ORDER — NITROGLYCERIN 0.4 MG SL SUBL
0.4000 mg | SUBLINGUAL_TABLET | SUBLINGUAL | Status: DC | PRN
  Administered 2023-09-10: 0.4 mg via SUBLINGUAL
  Filled 2023-09-10: qty 1

## 2023-09-10 MED ORDER — FOLIC ACID 1 MG PO TABS
1.0000 mg | ORAL_TABLET | Freq: Every day | ORAL | Status: DC
  Administered 2023-09-10 – 2023-09-13 (×4): 1 mg via ORAL
  Filled 2023-09-10 (×4): qty 1

## 2023-09-10 MED ORDER — LORAZEPAM 2 MG/ML IJ SOLN
2.0000 mg | Freq: Once | INTRAMUSCULAR | Status: DC
  Filled 2023-09-10: qty 1

## 2023-09-10 MED ORDER — DULOXETINE HCL 60 MG PO CPEP: 90.0000 mg | ORAL_CAPSULE | Freq: Every day | ORAL | Status: DC

## 2023-09-10 MED ORDER — THIAMINE MONONITRATE 100 MG PO TABS
100.0000 mg | ORAL_TABLET | Freq: Every day | ORAL | Status: DC
  Administered 2023-09-10 – 2023-09-13 (×4): 100 mg via ORAL
  Filled 2023-09-10 (×4): qty 1

## 2023-09-10 MED ORDER — ASPIRIN 81 MG PO CHEW
324.0000 mg | CHEWABLE_TABLET | Freq: Once | ORAL | Status: AC
  Administered 2023-09-10: 324 mg via ORAL
  Filled 2023-09-10: qty 4

## 2023-09-10 MED ORDER — ONDANSETRON HCL 4 MG/2ML IJ SOLN
4.0000 mg | Freq: Four times a day (QID) | INTRAMUSCULAR | Status: DC | PRN
Start: 2023-09-10 — End: 2023-09-13
  Administered 2023-09-10 – 2023-09-12 (×5): 4 mg via INTRAVENOUS
  Filled 2023-09-10 (×5): qty 2

## 2023-09-10 MED ORDER — NITROGLYCERIN 0.4 MG SL SUBL: 0.4000 mg | SUBLINGUAL_TABLET | SUBLINGUAL | Status: DC | PRN

## 2023-09-10 MED ORDER — PANTOPRAZOLE SODIUM 40 MG PO TBEC
40.0000 mg | DELAYED_RELEASE_TABLET | Freq: Every day | ORAL | Status: DC
Start: 2023-09-10 — End: 2023-09-13
  Administered 2023-09-10 – 2023-09-13 (×4): 40 mg via ORAL
  Filled 2023-09-10 (×4): qty 1

## 2023-09-10 MED ORDER — LORAZEPAM 2 MG/ML IJ SOLN
2.0000 mg | Freq: Once | INTRAMUSCULAR | Status: AC
  Administered 2023-09-10: 2 mg via INTRAVENOUS

## 2023-09-10 MED ORDER — LORAZEPAM 2 MG PO TABS
2.0000 mg | ORAL_TABLET | Freq: Once | ORAL | Status: AC
  Administered 2023-09-10: 2 mg via ORAL
  Filled 2023-09-10: qty 1

## 2023-09-10 MED ORDER — ADULT MULTIVITAMIN W/MINERALS CH
1.0000 | ORAL_TABLET | Freq: Every day | ORAL | Status: DC
  Administered 2023-09-10 – 2023-09-13 (×4): 1 via ORAL
  Filled 2023-09-10 (×4): qty 1

## 2023-09-10 MED ORDER — ONDANSETRON 4 MG PO TBDP
4.0000 mg | ORAL_TABLET | Freq: Once | ORAL | Status: AC
  Administered 2023-09-10: 4 mg via ORAL
  Filled 2023-09-10: qty 1

## 2023-09-10 MED ORDER — ASPIRIN 81 MG PO TBEC
81.0000 mg | DELAYED_RELEASE_TABLET | Freq: Every day | ORAL | Status: DC
  Administered 2023-09-11 – 2023-09-13 (×3): 81 mg via ORAL
  Filled 2023-09-10 (×3): qty 1

## 2023-09-10 MED ORDER — HEPARIN BOLUS VIA INFUSION
4000.0000 [IU] | Freq: Once | INTRAVENOUS | Status: AC
  Administered 2023-09-10: 4000 [IU] via INTRAVENOUS
  Filled 2023-09-10: qty 4000

## 2023-09-10 MED ORDER — THIAMINE HCL 100 MG/ML IJ SOLN: 100.0000 mg | Freq: Every day | INTRAMUSCULAR | Status: DC

## 2023-09-10 MED ORDER — ACETAMINOPHEN 325 MG PO TABS
650.0000 mg | ORAL_TABLET | ORAL | Status: DC | PRN
  Administered 2023-09-12 (×2): 650 mg via ORAL
  Filled 2023-09-10 (×3): qty 2

## 2023-09-10 MED ORDER — LORAZEPAM 2 MG/ML IJ SOLN
0.0000 mg | Freq: Four times a day (QID) | INTRAMUSCULAR | Status: AC
  Administered 2023-09-11 – 2023-09-12 (×2): 1 mg via INTRAVENOUS
  Administered 2023-09-12: 2 mg via INTRAVENOUS
  Filled 2023-09-10: qty 1
  Filled 2023-09-10: qty 2
  Filled 2023-09-10: qty 1

## 2023-09-10 MED ORDER — CITALOPRAM HYDROBROMIDE 20 MG PO TABS: 20.0000 mg | ORAL_TABLET | Freq: Every day | ORAL | Status: DC

## 2023-09-10 MED ORDER — LORAZEPAM 2 MG/ML IJ SOLN
1.0000 mg | INTRAMUSCULAR | Status: DC | PRN
  Administered 2023-09-12: 2 mg via INTRAVENOUS

## 2023-09-10 MED ORDER — LORAZEPAM 1 MG PO TABS
1.0000 mg | ORAL_TABLET | ORAL | Status: DC | PRN
  Administered 2023-09-10 – 2023-09-11 (×2): 1 mg via ORAL
  Administered 2023-09-11 – 2023-09-12 (×2): 2 mg via ORAL
  Administered 2023-09-12: 1 mg via ORAL
  Administered 2023-09-12: 3 mg via ORAL
  Filled 2023-09-10 (×2): qty 1
  Filled 2023-09-10: qty 3
  Filled 2023-09-10: qty 1
  Filled 2023-09-10 (×2): qty 2

## 2023-09-10 MED ORDER — HEPARIN SODIUM (PORCINE) 1000 UNIT/ML IJ SOLN
4000.0000 [IU] | Freq: Once | INTRAMUSCULAR | Status: DC
  Filled 2023-09-10: qty 4

## 2023-09-10 MED ORDER — CITALOPRAM HYDROBROMIDE 20 MG PO TABS
20.0000 mg | ORAL_TABLET | Freq: Every day | ORAL | Status: DC
  Administered 2023-09-11 – 2023-09-13 (×3): 20 mg via ORAL
  Filled 2023-09-10 (×3): qty 1

## 2023-09-10 MED ORDER — DULOXETINE HCL 30 MG PO CPEP
90.0000 mg | ORAL_CAPSULE | Freq: Every day | ORAL | Status: DC
  Administered 2023-09-11 – 2023-09-13 (×3): 90 mg via ORAL
  Filled 2023-09-10 (×3): qty 3

## 2023-09-10 MED ORDER — QUETIAPINE FUMARATE 100 MG PO TABS
100.0000 mg | ORAL_TABLET | Freq: Every day | ORAL | Status: DC
  Administered 2023-09-10 – 2023-09-12 (×3): 100 mg via ORAL
  Filled 2023-09-10 (×3): qty 1
  Filled 2023-09-10: qty 4

## 2023-09-10 NOTE — Assessment & Plan Note (Signed)
Uncertain etiology, cardiac or GI Zofran as needed, start Protonix if vomiting

## 2023-09-10 NOTE — H&P (Signed)
History and Physical    Patient: Tracy Phillips UUV:[id number] DOB: 1966/10/01 DOA: 09/10/2023 DOS: the patient was seen and examined on 09/10/2023 PCP: Dale Pike, MD  Patient coming from: Home  Chief Complaint:  Chief Complaint  Patient presents with   Drug Problem    HPI: Tracy Phillips is a 57 y.o. female with medical history significant for Alcohol use disorder with prior related hospitalizations most recently 03/2023, anxiety and depression, and cocaine use who presents to the ED with chest pain and nausea that started after smoking crack for about 2 hours.  She is not a regular crack user.  On arrival of EMS she was very anxious and hyperventilating with a pulse of 121. ED course and data review: On arrival temp 99 with pulse 113 and otherwise normal vitals Labs: WBC 17,000 Troponin 107-106 BMP with creatinine 1.34 up from 0.6 for a few months prior Anion gap 17 and bicarb 18 UDS pending EKG, personally viewed and interpreted showing sinus tachycardia at 109 with no acute ST-T wave changes. Chest x-ray not done Patient treated with Zofran, lorazepam and given chewable aspirin sublingual nitroglycerin and started on a heparin infusion Hospitalist consulted for admission for chest pain rule out   Review of Systems: As mentioned in the history of present illness. All other systems reviewed and are negative.  Past Medical History:  Diagnosis Date   Depression    H/O dizziness    H/O eating disorder    H/O: alcohol abuse    Herpes    H/O   History of chicken pox    History of colon polyps    Hyperlipidemia    Hypertension    Kidney stones    H/O stones   Past Surgical History:  Procedure Laterality Date   ABDOMINAL HYSTERECTOMY  1998   APPENDECTOMY  1997   DILATION AND CURETTAGE OF UTERUS     TONSILLECTOMY  1997   Social History:  reports that she has never smoked. She has never used smokeless tobacco. She reports that she does not drink alcohol and does  not use drugs.  Allergies  Allergen Reactions   Aleve [Naproxen Sodium] Hives    Family History  Problem Relation Age of Onset   Hyperlipidemia Mother    Hypertension Mother    Breast cancer Mother 46   Arthritis Father    Hyperlipidemia Father    Hypertension Father    Diabetes Father    Hypertension Brother    Arthritis Maternal Aunt    Hyperlipidemia Maternal Aunt    Hypertension Maternal Aunt    Alcohol abuse Maternal Uncle    Arthritis Maternal Uncle    Hyperlipidemia Maternal Uncle    Hypertension Maternal Uncle    Arthritis Paternal Aunt    Hyperlipidemia Paternal Aunt    Hypertension Paternal Aunt    Arthritis Paternal Uncle    Hyperlipidemia Paternal Uncle    Hypertension Paternal Uncle    Arthritis Maternal Grandmother    Hyperlipidemia Maternal Grandmother    Hypertension Maternal Grandmother    Arthritis Maternal Grandfather    Lung cancer Maternal Grandfather    Prostate cancer Maternal Grandfather    Hyperlipidemia Maternal Grandfather    Stroke Maternal Grandfather    Hypertension Maternal Grandfather    Arthritis Paternal Grandmother    Hyperlipidemia Paternal Grandmother    Hypertension Paternal Grandmother    Alcohol abuse Paternal Grandfather    Arthritis Paternal Grandfather    Hyperlipidemia Paternal Grandfather    Hypertension Paternal  Grandfather    Sudden death Paternal Grandfather     Prior to Admission medications   Medication Sig Start Date End Date Taking? Authorizing Provider  bisacodyl (DULCOLAX) 10 MG suppository Use one per rectum bid prn 04/21/23   Dale Connerton, MD  citalopram (CELEXA) 20 MG tablet Take 20 mg by mouth daily. 06/17/23   [provider]  DULoxetine (CYMBALTA) 30 MG capsule Take 3 capsules (90 mg total) by mouth daily. 04/14/23   Starleen Blue, NP  ondansetron (ZOFRAN) 4 MG tablet Take 1 tablet (4 mg total) by mouth 2 (two) times daily as needed for nausea or vomiting. 07/17/23   Dale Hermitage, MD   pantoprazole (PROTONIX) 40 MG tablet Take 1 tablet (40 mg total) by mouth daily. 04/14/23   Starleen Blue, NP  QUEtiapine (SEROQUEL) 100 MG tablet Take 100 mg by mouth at bedtime. 06/17/23   [provider]    Physical Exam: Vitals:   09/10/23 1737 09/10/23 1739 09/10/23 1930  BP: (!) 130/57  120/72  Pulse: (!) 113  83  Resp: 18  16  Temp: 99 F (37.2 C)    TempSrc: Oral    SpO2: 94%  96%  Weight:  70.3 kg   Height:  5\' 4"  (1.626 m)    Physical Exam Vitals and nursing note reviewed.  Constitutional:      General: She is not in acute distress. HENT:     Head: Normocephalic and atraumatic.  Cardiovascular:     Rate and Rhythm: Regular rhythm. Tachycardia present.     Heart sounds: Normal heart sounds.  Pulmonary:     Effort: Pulmonary effort is normal.     Breath sounds: Normal breath sounds.  Abdominal:     Palpations: Abdomen is soft.     Tenderness: There is no abdominal tenderness.  Neurological:     Mental Status: Mental status is at baseline.     Labs on Admission: I have personally reviewed following labs and imaging studies  CBC: Recent Labs  Lab 09/10/23 1744  WBC 17.2*  HGB 14.6  HCT 44.1  MCV 88.2  PLT 268   Basic Metabolic Panel: Recent Labs  Lab 09/10/23 1744  NA 137  K 4.5  CL 102  CO2 18*  GLUCOSE 142*  BUN 30*  CREATININE 1.34*  CALCIUM 9.6   GFR: Estimated Creatinine Clearance: 46.1 mL/min (A) (by C-G formula based on SCr of 1.34 mg/dL (H)). Liver Function Tests: No results for input(s): "AST", "ALT", "ALKPHOS", "BILITOT", "PROT", "ALBUMIN" in the last 168 hours. No results for input(s): "LIPASE", "AMYLASE" in the last 168 hours. No results for input(s): "AMMONIA" in the last 168 hours. Coagulation Profile: No results for input(s): "INR", "PROTIME" in the last 168 hours. Cardiac Enzymes: No results for input(s): "CKTOTAL", "CKMB", "CKMBINDEX", "TROPONINI" in the last 168 hours. BNP (last 3 results) No results for  input(s): "PROBNP" in the last 8760 hours. HbA1C: No results for input(s): "HGBA1C" in the last 72 hours. CBG: No results for input(s): "GLUCAP" in the last 168 hours. Lipid Profile: No results for input(s): "CHOL", "HDL", "LDLCALC", "TRIG", "CHOLHDL", "LDLDIRECT" in the last 72 hours. Thyroid Function Tests: No results for input(s): "TSH", "T4TOTAL", "FREET4", "T3FREE", "THYROIDAB" in the last 72 hours. Anemia Panel: No results for input(s): "VITAMINB12", "FOLATE", "FERRITIN", "TIBC", "IRON", "RETICCTPCT" in the last 72 hours. Urine analysis: No results found for: "COLORURINE", "APPEARANCEUR", "LABSPEC", "PHURINE", "GLUCOSEU", "HGBUR", "BILIRUBINUR", "KETONESUR", "PROTEINUR", "UROBILINOGEN", "NITRITE", "LEUKOCYTESUR"  Radiological Exams on Admission: No results found.  Data Reviewed: Relevant notes from primary care and specialist visits, past discharge summaries as available in EHR, including Care Everywhere. Prior diagnostic testing as pertinent to current admission diagnoses Updated medications and problem lists for reconciliation ED course, including vitals, labs, imaging, treatment and response to treatment Triage notes, nursing and pharmacy notes and ED provider's notes Notable results as noted in HPI   Assessment and Plan: * Chest pain Elevated troponin with flat trend 107-106 Suspect related to crack cocaine use Will continue aspirin for now Continue heparin infusion started in the ED Echocardiogram in the a.m. Cardiology consult  Crack cocaine use Follow-up UDS  AKI (acute kidney injury) (HCC) IV hydration and monitor Avoid nephrotoxic drugs  Nausea Uncertain etiology, cardiac or GI Zofran as needed, start Protonix if vomiting  Alcohol use disorder CIWA withdrawal protocol  Anxiety and depression Continue Celexa Cymbalta and Seroquel  Salt   DVT prophylaxis: heparin  Consults: cardiology  Advance Care Planning:   Code Status: Prior   Family  Communication: none  Disposition Plan: Back to previous home environment  Severity of Illness: The appropriate patient status for this patient is INPATIENT. Inpatient status is judged to be reasonable and necessary in order to provide the required intensity of service to ensure the patient's safety. The patient's presenting symptoms, physical exam findings, and initial radiographic and laboratory data in the context of their chronic comorbidities is felt to place them at high risk for further clinical deterioration. Furthermore, it is not anticipated that the patient will be medically stable for discharge from the hospital within 2 midnights of admission.   * I certify that at the point of admission it is my clinical judgment that the patient will require inpatient hospital care spanning beyond 2 midnights from the point of admission due to high intensity of service, high risk for further deterioration and high frequency of surveillance required.*  Author: Andris Baumann, MD 09/10/2023 8:24 PM  For on call review [email or web address].

## 2023-09-10 NOTE — Progress Notes (Signed)
PHARMACY - ANTICOAGULATION CONSULT NOTE  Pharmacy Consult for heparin Indication: chest pain/ACS  Allergies  Allergen Reactions   Aleve [Naproxen Sodium] Hives   Patient Measurements: Height: 5\' 4"  (162.6 cm) Weight: 70.3 kg (155 lb) IBW/kg (Calculated) : 54.7 Heparin Dosing Weight: 69 kg  Vital Signs: Temp: 99 F (37.2 C) (10/23 1737) Temp Source: Oral (10/23 1737) BP: 130/57 (10/23 1737) Pulse Rate: 113 (10/23 1737)  Labs: Recent Labs    09/10/23 1744  HGB 14.6  HCT 44.1  PLT 268  CREATININE 1.34*  TROPONINIHS 107*   Estimated Creatinine Clearance: 46.1 mL/min (A) (by C-G formula based on SCr of 1.34 mg/dL (H)).  Medical History: Past Medical History:  Diagnosis Date   Depression    H/O dizziness    H/O eating disorder    H/O: alcohol abuse    Herpes    H/O   History of chicken pox    History of colon polyps    Hyperlipidemia    Hypertension    Kidney stones    H/O stones   Assessment: 57 y/o female presenting with chest pain and nausea after cocaine use. Troponins elevated in ED. Pharmacy has been consulted to initiate heparin infusion. Patient is not on anticoagulation PTA.  Goal of Therapy:  Heparin level 0.3-0.7 units/ml Monitor platelets by anticoagulation protocol: Yes   Plan:  Give heparin bolus of 4000 units x1 Start heparin infusion at 800 units/hour Check Anti-Xa level in 6 hours Monitor daily Anti-Xa levels while on heparin infusion Monitor daily CBC and signs/symptoms of bleeding  Thank you for involving pharmacy in this patient's care.   Rockwell Alexandria, PharmD Clinical Pharmacist 09/10/2023 6:43 PM

## 2023-09-10 NOTE — Assessment & Plan Note (Signed)
Elevated troponin with flat trend 107-106 Suspect related to crack cocaine use Will continue aspirin for now Continue heparin infusion started in the ED Echocardiogram in the a.m. Cardiology consult

## 2023-09-10 NOTE — ED Provider Notes (Signed)
[facility] Provider Note    Event Date/Time   First MD Initiated Contact with Patient 09/10/23 1758     (approximate)   History   Drug Problem   HPI  Tracy Phillips is a 57 y.o. female   Past medical history of polysubstance use presents to the emergency department with chest pain and nausea after smoking crack cocaine earlier today.  Was in her regular state of health prior.  No other acute medical complaints.      Physical Exam   Triage Vital Signs: ED Triage Vitals  Encounter Vitals Group     BP 09/10/23 1737 (!) 130/57     Systolic BP Percentile --      Diastolic BP Percentile --      Pulse Rate 09/10/23 1737 (!) 113     Resp 09/10/23 1737 18     Temp 09/10/23 1737 99 F (37.2 C)     Temp Source 09/10/23 1737 Oral     SpO2 09/10/23 1737 94 %     Weight 09/10/23 1739 155 lb (70.3 kg)     Height 09/10/23 1739 5\' 4"  (1.626 m)     Head Circumference --      Peak Flow --      Pain Score 09/10/23 1738 0     Pain Loc --      Pain Education --      Exclude from Growth Chart --     Most recent vital signs: Vitals:   09/10/23 2043 09/10/23 2110  BP:  124/71  Pulse:  80  Resp:  16  Temp: 98.9 F (37.2 C) 99.5 F (37.5 C)  SpO2:  100%    General: Awake, no distress.  CV:  Good peripheral perfusion.  Resp:  Normal effort.  Abd:  No distention.  Other:  Anxious appearing, dry heaving.  Normal vital signs, normal heart sounds lung sounds.  Skin appears warm well-perfused.   ED Results / Procedures / Treatments   Labs (all labs ordered are listed, but only abnormal results are displayed) Labs Reviewed  CBC - Abnormal; Notable for the following components:      Result Value   WBC 17.2 (*)    All other components within normal limits  BASIC METABOLIC PANEL - Abnormal; Notable for the following components:   CO2 18 (*)    Glucose, Bld 142 (*)    BUN 30 (*)    Creatinine, Ser 1.34 (*)    GFR, Estimated 47 (*)    Anion gap 17  (*)    All other components within normal limits  TROPONIN I (HIGH SENSITIVITY) - Abnormal; Notable for the following components:   Troponin I (High Sensitivity) 107 (*)    All other components within normal limits  TROPONIN I (HIGH SENSITIVITY) - Abnormal; Notable for the following components:   Troponin I (High Sensitivity) 106 (*)    All other components within normal limits  URINE DRUG SCREEN, QUALITATIVE ([facility] ONLY)  HEPARIN LEVEL (UNFRACTIONATED)  CBC  LIPOPROTEIN A (LPA)     I ordered and reviewed the above labs they are notable for blood cell count of 17.2 and elevated troponin over 100  EKG  ED ECG REPORT I, Pilar Jarvis, the attending physician, personally viewed and interpreted this ECG.   Date: 09/10/2023  EKG Time: 1743  Rate: 109  Rhythm: sinus tachycardia  Axis: nl  Intervals:none  ST&T Change: no stemi    RADIOLOGY I independently reviewed and  interpreted chest x-ray and I see no obvious opacity I also reviewed radiologist's formal read.   PROCEDURES:  Critical Care performed: Yes, see critical care procedure note(s)  .Critical Care  Performed by: Pilar Jarvis, MD Authorized by: Pilar Jarvis, MD   Critical care provider statement:    Critical care time (minutes):  30   Critical care was time spent personally by me on the following activities:  Development of treatment plan with patient or surrogate, discussions with consultants, evaluation of patient's response to treatment, examination of patient, ordering and review of laboratory studies, ordering and review of radiographic studies, ordering and performing treatments and interventions, pulse oximetry, re-evaluation of patient's condition and review of old charts    MEDICATIONS ORDERED IN ED: Medications  heparin ADULT infusion 100 units/mL (25000 units/277mL) (800 Units/hr Intravenous New Bag/Given 09/10/23 1948)  QUEtiapine (SEROQUEL) tablet 100 mg (100 mg Oral Given 09/10/23 2212)  pantoprazole  (PROTONIX) EC tablet 40 mg (40 mg Oral Given 09/10/23 2212)  LORazepam (ATIVAN) tablet 1-4 mg (1 mg Oral Given 09/10/23 2211)    Or  LORazepam (ATIVAN) injection 1-4 mg ( Intravenous See Alternative 09/10/23 2211)  thiamine (VITAMIN B1) tablet 100 mg (100 mg Oral Given 09/10/23 2211)    Or  thiamine (VITAMIN B1) injection 100 mg ( Intravenous See Alternative 09/10/23 2211)  folic acid (FOLVITE) tablet 1 mg (1 mg Oral Given 09/10/23 2212)  multivitamin with minerals tablet 1 tablet (1 tablet Oral Given 09/10/23 2212)  aspirin EC tablet 81 mg (has no administration in time range)  nitroGLYCERIN (NITROSTAT) SL tablet 0.4 mg (has no administration in time range)  acetaminophen (TYLENOL) tablet 650 mg (has no administration in time range)  ondansetron (ZOFRAN) injection 4 mg (4 mg Intravenous Given 09/10/23 2213)  LORazepam (ATIVAN) injection 0-4 mg ( Intravenous Not Given 09/10/23 2058)    Followed by  LORazepam (ATIVAN) injection 0-4 mg (has no administration in time range)  Oral care mouth rinse (has no administration in time range)  DULoxetine (CYMBALTA) DR capsule 90 mg (has no administration in time range)  citalopram (CELEXA) tablet 20 mg (has no administration in time range)  ondansetron (ZOFRAN-ODT) disintegrating tablet 4 mg (4 mg Oral Given 09/10/23 1749)  LORazepam (ATIVAN) tablet 2 mg (2 mg Oral Given 09/10/23 1809)  aspirin chewable tablet 324 mg (324 mg Oral Given 09/10/23 1843)  heparin bolus via infusion 4,000 Units (4,000 Units Intravenous Bolus from Bag 09/10/23 1949)  LORazepam (ATIVAN) injection 2 mg (2 mg Intravenous Given 09/10/23 1946)    External physician / consultants:  I spoke with hospitalist for admission and regarding care plan for this patient.   IMPRESSION / MDM / ASSESSMENT AND PLAN / ED COURSE  I reviewed the triage vital signs and the nursing notes.                                Patient's presentation is most consistent with acute presentation with  potential threat to life or bodily function.  Differential diagnosis includes, but is not limited to, cocaine intoxication, ACS pneumothorax   The patient is on the cardiac monitor to evaluate for evidence of arrhythmia and/or significant heart rate changes.  MDM:    Patient with likely crack cocaine induced NSTEMI, ordered for   aspirin, benzodiazepines, nitroglycerin, heparin and avoid beta-blockers.  Admission.  Denies being a daily drinker, no alcohol today no signs of active withdrawal.  FINAL CLINICAL IMPRESSION(S) / ED DIAGNOSES   Final diagnoses:  Cocaine use  NSTEMI (non-ST elevated myocardial infarction) (HCC)     Rx / DC Orders   ED Discharge Orders     None        Note:  This document was prepared using Dragon voice recognition software and may include unintentional dictation errors.    Pilar Jarvis, MD 09/10/23 [id number]

## 2023-09-10 NOTE — ED Notes (Signed)
First nurse note-pt brought in via ems from home.  Pt smoked crack 1 hour ago.  Pt is paranoid and hyperventilating.  Bp 140/84, p-121, o2 sats-99% rm air per ems.  Pt in wheelchair in lobby.

## 2023-09-10 NOTE — Assessment & Plan Note (Signed)
Follow up UDS.

## 2023-09-10 NOTE — Assessment & Plan Note (Signed)
CIWA withdrawal protocol

## 2023-09-10 NOTE — Assessment & Plan Note (Signed)
IV hydration and monitor Avoid nephrotoxic drugs

## 2023-09-10 NOTE — Assessment & Plan Note (Signed)
Continue Celexa Cymbalta and Seroquel

## 2023-09-10 NOTE — ED Triage Notes (Signed)
Pt presents to ED with c/o of smoking crack 2 hours, pt states she does not use regularly. Pt states she is nauseous.

## 2023-09-10 NOTE — ED Notes (Addendum)
Critical Lab called to RN, informed ED MD, repeat test x60 min

## 2023-09-11 ENCOUNTER — Inpatient Hospital Stay (HOSPITAL_COMMUNITY)
Admit: 2023-09-11 | Discharge: 2023-09-11 | Disposition: A | Discharge status: Home / Self Care | Payer: BC Managed Care – PPO | Attending: Internal Medicine | Admitting: Internal Medicine

## 2023-09-11 DIAGNOSIS — R110 Nausea: Secondary | ICD-10-CM | POA: Diagnosis not present

## 2023-09-11 DIAGNOSIS — F1490 Cocaine use, unspecified, uncomplicated: Secondary | ICD-10-CM

## 2023-09-11 DIAGNOSIS — N179 Acute kidney failure, unspecified: Secondary | ICD-10-CM

## 2023-09-11 DIAGNOSIS — R079 Chest pain, unspecified: Secondary | ICD-10-CM | POA: Diagnosis not present

## 2023-09-11 DIAGNOSIS — F1090 Alcohol use, unspecified, uncomplicated: Secondary | ICD-10-CM

## 2023-09-11 DIAGNOSIS — I214 Non-ST elevation (NSTEMI) myocardial infarction: Secondary | ICD-10-CM | POA: Diagnosis not present

## 2023-09-11 DIAGNOSIS — R7989 Other specified abnormal findings of blood chemistry: Secondary | ICD-10-CM | POA: Diagnosis not present

## 2023-09-11 DIAGNOSIS — F32A Depression, unspecified: Secondary | ICD-10-CM

## 2023-09-11 DIAGNOSIS — F419 Anxiety disorder, unspecified: Secondary | ICD-10-CM

## 2023-09-11 DIAGNOSIS — I2489 Other forms of acute ischemic heart disease: Secondary | ICD-10-CM

## 2023-09-11 LAB — URINE DRUG SCREEN, QUALITATIVE (ARMC ONLY)
Amphetamines, Ur Screen: NOT DETECTED
Barbiturates, Ur Screen: NOT DETECTED
Benzodiazepine, Ur Scrn: POSITIVE — AB
Cannabinoid 50 Ng, Ur ~~LOC~~: NOT DETECTED
Cocaine Metabolite,Ur ~~LOC~~: POSITIVE — AB
MDMA (Ecstasy)Ur Screen: NOT DETECTED
Methadone Scn, Ur: NOT DETECTED
Opiate, Ur Screen: NOT DETECTED
Phencyclidine (PCP) Ur S: NOT DETECTED
Tricyclic, Ur Screen: POSITIVE — AB

## 2023-09-11 LAB — ECHOCARDIOGRAM COMPLETE
AR max vel: 2.31 cm2
AV Area VTI: 2.51 cm2
AV Area mean vel: 2.25 cm2
AV Mean grad: 5 mm[Hg]
AV Peak grad: 11 mm[Hg]
Ao pk vel: 1.66 m/s
Area-P 1/2: 3.13 cm2
Height: 64 in
MV VTI: 2.44 cm2
S' Lateral: 2.5 cm
Weight: 2480 [oz_av]

## 2023-09-11 LAB — HEPARIN LEVEL (UNFRACTIONATED)
Heparin Unfractionated: 0.45 [IU]/mL (ref 0.30–0.70)
Heparin Unfractionated: 0.37 [IU]/mL (ref 0.30–0.70)

## 2023-09-11 LAB — CBC
HCT: 35.8 % — ABNORMAL LOW (ref 36.0–46.0)
Hemoglobin: 12.1 g/dL (ref 12.0–15.0)
MCH: 29.2 pg (ref 26.0–34.0)
MCHC: 33.8 g/dL (ref 30.0–36.0)
MCV: 86.5 fL (ref 80.0–100.0)
Platelets: 206 10*3/uL (ref 150–400)
RBC: 4.14 MIL/uL (ref 3.87–5.11)
RDW: 14.6 % (ref 11.5–15.5)
WBC: 8.9 10*3/uL (ref 4.0–10.5)
nRBC: 0 % (ref 0.0–0.2)

## 2023-09-11 LAB — TROPONIN I (HIGH SENSITIVITY): Troponin I (High Sensitivity): 32 ng/L — ABNORMAL HIGH (ref <18)

## 2023-09-11 LAB — CK: Total CK: 2562 U/L — ABNORMAL HIGH (ref 38–234)

## 2023-09-11 LAB — HEMOGLOBIN A1C
Hgb A1c MFr Bld: 5.5 % (ref 4.8–5.6)
Mean Plasma Glucose: 111.15 mg/dL

## 2023-09-11 MED ORDER — METHOCARBAMOL 500 MG PO TABS
1000.0000 mg | ORAL_TABLET | Freq: Once | ORAL | Status: AC
  Administered 2023-09-11: 1000 mg via ORAL
  Filled 2023-09-11: qty 2

## 2023-09-11 MED ORDER — ENOXAPARIN SODIUM 40 MG/0.4ML IJ SOSY
40.0000 mg | PREFILLED_SYRINGE | INTRAMUSCULAR | Status: DC
  Administered 2023-09-11 – 2023-09-12 (×2): 40 mg via SUBCUTANEOUS
  Filled 2023-09-11 (×2): qty 0.4

## 2023-09-11 MED ORDER — SODIUM CHLORIDE 0.9 % IV SOLN: INTRAVENOUS | Status: AC

## 2023-09-11 MED ORDER — ROSUVASTATIN CALCIUM 10 MG PO TABS
40.0000 mg | ORAL_TABLET | Freq: Every day | ORAL | Status: DC
  Administered 2023-09-11: 40 mg via ORAL
  Filled 2023-09-11: qty 4

## 2023-09-11 NOTE — Consult Note (Signed)
Cardiology Consultation   Patient ID: Tracy Phillips MRN: [id number]; DOB: 22-Nov-1965  Admit date: 09/10/2023 Date of Consult: 09/11/2023  PCP:  Dale W[place], MD   [facility] Providers Cardiologist: New  Patient Profile:   Tracy Phillips is a 57 y.o. female with a hx of alcohol use disorder, anxiety and depression, cocaine use who is being seen 09/11/2023 for the evaluation of chest pain at the request of Dr Fran Lowes.  History of Present Illness:   [name] has no significant cardiac history. Says family history positive for CAD, heart attacks and HTN. She reports intermittent cocaine use. No alcohol or tobacco use.   Patient presented to the ER 09/10/2023 with chest pain and nausea after smoking crack for 2 hours. The patient says she has used crack 12 years ago and most recently a couple months ago. She has not experienced chest pain in the past with drug use, but may have some chest discomfort occasionally. She denies SOB, LLE, fever, chills. She says chest pain was centralized and non-radiating. It was pressure like and very breath.   In the ER WBC 17K, Hgb 14.6. Hs trop 106>107, Scr 1.35, BUN 30, anion gap 17, bicarb 18. EKG showed ST 109bpm. CXR negative. UDS with Tricyclics, cocaine and Benzos. The patient was given Zofran, Lorazepam, ASA and SL NTG, and started on IV heparin.    Past Medical History:  Diagnosis Date   Depression    H/O dizziness    H/O eating disorder    H/O: alcohol abuse    Herpes    H/O   History of chicken pox    History of colon polyps    Hyperlipidemia    Hypertension    Kidney stones    H/O stones    Past Surgical History:  Procedure Laterality Date   ABDOMINAL HYSTERECTOMY  1998   APPENDECTOMY  1997   DILATION AND CURETTAGE OF UTERUS     TONSILLECTOMY  1997     Home Medications:  Prior to Admission medications   Medication Sig Start Date End Date Taking? Authorizing Provider  bisacodyl (DULCOLAX) 10 MG  suppository Use one per rectum bid prn 04/21/23   Dale Savannah, MD  citalopram (CELEXA) 20 MG tablet Take 20 mg by mouth daily. 06/17/23   [provider]  DULoxetine (CYMBALTA) 30 MG capsule Take 3 capsules (90 mg total) by mouth daily. 04/14/23   Starleen Blue, NP  ondansetron (ZOFRAN) 4 MG tablet Take 1 tablet (4 mg total) by mouth 2 (two) times daily as needed for nausea or vomiting. 07/17/23   Dale [place], MD  pantoprazole (PROTONIX) 40 MG tablet Take 1 tablet (40 mg total) by mouth daily. 04/14/23   Starleen Blue, NP  QUEtiapine (SEROQUEL) 100 MG tablet Take 100 mg by mouth at bedtime. 06/17/23   [provider]    Inpatient Medications: Scheduled Meds:  aspirin EC  81 mg Oral Daily   citalopram  20 mg Oral Daily   DULoxetine  90 mg Oral Daily   folic acid  1 mg Oral Daily   LORazepam  0-4 mg Intravenous Q6H   Followed by   Melene Muller ON 09/12/2023] LORazepam  0-4 mg Intravenous Q12H   multivitamin with minerals  1 tablet Oral Daily   pantoprazole  40 mg Oral Daily   QUEtiapine  100 mg Oral QHS   thiamine  100 mg Oral Daily   Or   thiamine  100 mg Intravenous Daily   Continuous  Infusions:  heparin 800 Units/hr (09/11/23 0328)   PRN Meds: acetaminophen, LORazepam **OR** LORazepam, nitroGLYCERIN, ondansetron (ZOFRAN) IV, mouth rinse  Allergies:    Allergies  Allergen Reactions   Aleve [Naproxen Sodium] Hives    Social History:   Social History   Socioeconomic History   Marital status: Single    Spouse name: Not on file   Number of children: Not on file   Years of education: Not on file   Highest education level: Not on file  Occupational History   Not on file  Tobacco Use   Smoking status: Never   Smokeless tobacco: Never  Vaping Use   Vaping status: Never Used  Substance and Sexual Activity   Alcohol use: No    Comment: sober since 10/27/2010   Drug use: No   Sexual activity: Not on file  Other Topics Concern   Not on file  Social  History Narrative   Not on file   Social Determinants of Health   Financial Resource Strain: Not on file  Food Insecurity: No Food Insecurity (09/10/2023)   Hunger Vital Sign    Worried About Running Out of Food in the Last Year: Never true    Ran Out of Food in the Last Year: Never true  Transportation Needs: No Transportation Needs (09/10/2023)   PRAPARE - Administrator, Civil Service (Medical): No    Lack of Transportation (Non-Medical): No  Physical Activity: Not on file  Stress: Not on file  Social Connections: Not on file  Intimate Partner Violence: Not At Risk (09/10/2023)   Humiliation, Afraid, Rape, and Kick questionnaire    Fear of Current or Ex-Partner: No    Emotionally Abused: No    Physically Abused: No    Sexually Abused: No    Family History:    Family History  Problem Relation Age of Onset   Hyperlipidemia Mother    Hypertension Mother    Breast cancer Mother 69   Arthritis Father    Hyperlipidemia Father    Hypertension Father    Diabetes Father    Hypertension Brother    Arthritis Maternal Aunt    Hyperlipidemia Maternal Aunt    Hypertension Maternal Aunt    Alcohol abuse Maternal Uncle    Arthritis Maternal Uncle    Hyperlipidemia Maternal Uncle    Hypertension Maternal Uncle    Arthritis Paternal Aunt    Hyperlipidemia Paternal Aunt    Hypertension Paternal Aunt    Arthritis Paternal Uncle    Hyperlipidemia Paternal Uncle    Hypertension Paternal Uncle    Arthritis Maternal Grandmother    Hyperlipidemia Maternal Grandmother    Hypertension Maternal Grandmother    Arthritis Maternal Grandfather    Lung cancer Maternal Grandfather    Prostate cancer Maternal Grandfather    Hyperlipidemia Maternal Grandfather    Stroke Maternal Grandfather    Hypertension Maternal Grandfather    Arthritis Paternal Grandmother    Hyperlipidemia Paternal Grandmother    Hypertension Paternal Grandmother    Alcohol abuse Paternal Grandfather     Arthritis Paternal Grandfather    Hyperlipidemia Paternal Grandfather    Hypertension Paternal Grandfather    Sudden death Paternal Grandfather      ROS:  Please see the history of present illness.   All other ROS reviewed and negative.     Physical Exam/Data:   Vitals:   09/10/23 2043 09/10/23 2110 09/11/23 0030 09/11/23 0451  BP:  124/71 (!) 96/48 102/65  Pulse:  80  98 80  Resp:  16 18 17   Temp: 98.9 F (37.2 C) 99.5 F (37.5 C)  98.7 F (37.1 C)  TempSrc: Oral Oral  Oral  SpO2:  100% 100% 96%  Weight:      Height:        Intake/Output Summary (Last 24 hours) at 09/11/2023 0803 Last data filed at 09/11/2023 0511 Gross per 24 hour  Intake 100.89 ml  Output 600 ml  Net -499.11 ml      09/10/2023    5:39 PM 06/20/2023    9:29 AM 04/21/2023   11:04 AM  Last 3 Weights  Weight (lbs) 155 lb 155 lb 151 lb  Weight (kg) 70.308 kg 70.308 kg 68.493 kg     Body mass index is 26.61 kg/m.  General:  Well nourished, well developed, in no acute distress HEENT: normal Neck: no JVD Vascular: No carotid bruits; Distal pulses 2+ bilaterally Cardiac:  normal S1, S2; RRR; no murmur  Lungs:  clear to auscultation bilaterally, no wheezing, rhonchi or rales  Abd: soft, nontender, no hepatomegaly  Ext: no edema Musculoskeletal:  No deformities, BUE and BLE strength normal and equal Skin: warm and dry  Neuro:  CNs 2-12 intact, no focal abnormalities noted Psych:  Normal affect   EKG:  The EKG was personally reviewed and demonstrates:  ST 109bpm, q waves inf/lat leads, nonspeicific ST changes Telemetry:  Telemetry was personally reviewed and demonstrates:  Nsr HR 80-90s  Relevant CV Studies:  Echo ordered  Laboratory Data:  High Sensitivity Troponin:   Recent Labs  Lab 09/10/23 1744 09/10/23 1937  TROPONINIHS 107* 106*     Chemistry Recent Labs  Lab 09/10/23 1744  NA 137  K 4.5  CL 102  CO2 18*  GLUCOSE 142*  BUN 30*  CREATININE 1.34*  CALCIUM 9.6  GFRNONAA 47*   ANIONGAP 17*    No results for input(s): "PROT", "ALBUMIN", "AST", "ALT", "ALKPHOS", "BILITOT" in the last 168 hours. Lipids No results for input(s): "CHOL", "TRIG", "HDL", "LABVLDL", "LDLCALC", "CHOLHDL" in the last 168 hours.  Hematology Recent Labs  Lab 09/10/23 1744  WBC 17.2*  RBC 5.00  HGB 14.6  HCT 44.1  MCV 88.2  MCH 29.2  MCHC 33.1  RDW 14.6  PLT 268   Thyroid No results for input(s): "TSH", "FREET4" in the last 168 hours.  BNPNo results for input(s): "BNP", "PROBNP" in the last 168 hours.  DDimer No results for input(s): "DDIMER" in the last 168 hours.   Radiology/Studies:  DG Chest Port 1 View  Result Date: 09/10/2023 CLINICAL DATA:  Chest pain.  Drug use. EXAM: PORTABLE CHEST 1 VIEW COMPARISON:  01/10/2013 FINDINGS: The heart size and mediastinal contours are within normal limits. Both lungs are clear. The visualized skeletal structures are unremarkable. IMPRESSION: No active disease. Electronically Signed   By: Burman Nieves M.D.   On: 09/10/2023 22:20     Assessment and Plan:   NSTEMI - elevated trop and chest pain in the setting of crack cocaine use - HS trop 106>107, continue to trend - continue IV heparin - echo ordered - check A1C and lipids - continue ASA - start statin therapy - caution with BB given cocaine use - BP soft, limiting antianginals - may need cardiac cath  AKI - Scr 1.34, BUN 30 - IVF  Alcohol use - patient denies alcohol use - CIWA per IM  Cocaine use - UDS + cocaine  For questions or updates, please contact [facility] Please  consult [email or web address] for contact info under    Signed, Dagen Beevers David Stall, PA-C  09/11/2023 8:03 AM

## 2023-09-11 NOTE — Progress Notes (Signed)
PHARMACY - ANTICOAGULATION CONSULT NOTE  Pharmacy Consult for heparin Indication: chest pain/ACS  Allergies  Allergen Reactions   Aleve [Naproxen Sodium] Hives   Patient Measurements: Height: 5\' 4"  (162.6 cm) Weight: 70.3 kg (155 lb) IBW/kg (Calculated) : 54.7 Heparin Dosing Weight: 69 kg  Vital Signs: Temp: 99.5 F (37.5 C) (10/23 2110) Temp Source: Oral (10/23 2110) BP: 96/48 (10/24 0030) Pulse Rate: 98 (10/24 0030)  Labs: Recent Labs    09/10/23 1744 09/10/23 1937 09/11/23 0109  HGB 14.6  --   --   HCT 44.1  --   --   PLT 268  --   --   HEPARINUNFRC  --   --  0.45  CREATININE 1.34*  --   --   TROPONINIHS 107* 106*  --    Estimated Creatinine Clearance: 46.1 mL/min (A) (by C-G formula based on SCr of 1.34 mg/dL (H)).  Medical History: Past Medical History:  Diagnosis Date   Depression    H/O dizziness    H/O eating disorder    H/O: alcohol abuse    Herpes    H/O   History of chicken pox    History of colon polyps    Hyperlipidemia    Hypertension    Kidney stones    H/O stones   Assessment: 57 y/o female presenting with chest pain and nausea after cocaine use. Troponins elevated in ED. Pharmacy has been consulted to initiate heparin infusion. Patient is not on anticoagulation PTA.  Goal of Therapy:  Heparin level 0.3-0.7 units/ml Monitor platelets by anticoagulation protocol: Yes   Plan:  10/24:  HL @ 0109 = 0.45, therapeutic X 1 - Will continue pt on current rate and recheck HL on 10/24 @ 0700.  Monitor daily CBC and signs/symptoms of bleeding  Thank you for involving pharmacy in this patient's care.   Brigitte Soderberg D Clinical Pharmacist 09/11/2023 2:00 AM

## 2023-09-11 NOTE — Progress Notes (Signed)
PHARMACY - ANTICOAGULATION CONSULT NOTE  Pharmacy Consult for heparin Indication: chest pain/ACS  Allergies  Allergen Reactions   Aleve [Naproxen Sodium] Hives   Patient Measurements: Height: 5\' 4"  (162.6 cm) Weight: 70.3 kg (155 lb) IBW/kg (Calculated) : 54.7 Heparin Dosing Weight: 69 kg  Vital Signs: Temp: 98.7 F (37.1 C) (10/24 0451) Temp Source: Oral (10/24 0451) BP: 102/65 (10/24 0451) Pulse Rate: 80 (10/24 0451)  Labs: Recent Labs    09/10/23 1744 09/10/23 1937 09/11/23 0109 09/11/23 0744  HGB 14.6  --   --  12.1  HCT 44.1  --   --  35.8*  PLT 268  --   --  206  HEPARINUNFRC  --   --  0.45 0.37  CREATININE 1.34*  --   --   --   TROPONINIHS 107* 106*  --   --    Estimated Creatinine Clearance: 46.1 mL/min (A) (by C-G formula based on SCr of 1.34 mg/dL (H)).  Medical History: Past Medical History:  Diagnosis Date   Depression    H/O dizziness    H/O eating disorder    H/O: alcohol abuse    Herpes    H/O   History of chicken pox    History of colon polyps    Hyperlipidemia    Hypertension    Kidney stones    H/O stones   Assessment: 57 y/o female presenting with chest pain and nausea after cocaine use. Troponins elevated in ED. Pharmacy has been consulted to initiate heparin infusion. Patient is not on anticoagulation PTA.  Goal of Therapy:  Heparin level 0.3-0.7 units/ml Monitor platelets by anticoagulation protocol: Yes   Date Time HL Comments 10/24 0109 0.45 Therapeutic x 1 @ 800 u/hr 10/24 0744 0.37 Therapeutic x 2  Plan:  Will continue pt on current heparin rate of 800 un/hr and recheck HL with AM labs Monitor daily CBC and signs/symptoms of bleeding  Jencarlos Nicolson Rodriguez-Guzman PharmD, BCPS 09/11/2023 9:09 AM

## 2023-09-11 NOTE — Progress Notes (Signed)
  PROGRESS NOTE    Tracy Phillips  [id number]:[id number] DOB: 1966-08-14 DOA: 09/10/2023 PCP: Dale [place], MD  249A/249A-AA  LOS: 1 day   Brief hospital course:   Assessment & Plan: Tracy Phillips is a 57 y.o. female with medical history significant for Alcohol use disorder with prior related hospitalizations most recently 03/2023, anxiety and depression, and cocaine use who presents to the ED with chest pain and nausea that started after smoking crack for about 2 hours.  On arrival of EMS she was very anxious and hyperventilating with a pulse of 121.    * Chest pain, ACS ruled out Elevated troponin with flat trend 107, 106, 32 Suspect related to crack cocaine use Cardiology consulted.  Echo unremarkable Plan: --d/c heparin gtt --outpatient ischemic eval  Rhabdomyolysis --2/2 cocaine use.  CK 2562 with symptoms of muscle soreness. --start MIVF@100   AKI --likely due to rhabomyolysis --start MIVF@100   Cocaine use  Alcohol use disorder CIWA withdrawal protocol  Anxiety and depression Continue Celexa Cymbalta and Seroquel   DVT prophylaxis: Lovenox SQ Code Status: Full code  Family Communication:  Level of care: Progressive Dispo:   The patient is from: home Anticipated d/c is to: home Anticipated d/c date is: tomorrow   Subjective and Interval History:  Pt complained of sore and stiff muscles in legs and arms.   Objective: Vitals:   09/11/23 0451 09/11/23 1021 09/11/23 1224 09/11/23 1607  BP: 102/65 (!) 114/56 104/65 (!) 116/58  Pulse: 80 84 85 79  Resp: 17 20 20 20   Temp: 98.7 F (37.1 C) 98.8 F (37.1 C) 99.2 F (37.3 C) 99.2 F (37.3 C)  TempSrc: Oral Oral Oral Oral  SpO2: 96% 97% 97% 97%  Weight:      Height:        Intake/Output Summary (Last 24 hours) at 09/11/2023 1716 Last data filed at 09/11/2023 1358 Gross per 24 hour  Intake 184.62 ml  Output 600 ml  Net -415.38 ml   Filed Weights   09/10/23 1739  Weight: 70.3 kg    Examination:    Constitutional: NAD, AAOx3 HEENT: conjunctivae and lids normal, EOMI CV: No cyanosis.   RESP: normal respiratory effort, on RA Neuro: II - XII grossly intact.   Psych: Normal mood and affect.  Appropriate judgement and reason   Data Reviewed: I have personally reviewed labs and imaging studies  Time spent: 50 minutes  Darlin Priestly, MD [facility] If 7PM-7AM, please contact night-coverage 09/11/2023, 5:16 PM

## 2023-09-11 NOTE — Progress Notes (Signed)
*  PRELIMINARY RESULTS* Echocardiogram 2D Echocardiogram has been performed.  Tracy Phillips 09/11/2023, 9:45 AM

## 2023-09-12 DIAGNOSIS — R079 Chest pain, unspecified: Secondary | ICD-10-CM | POA: Diagnosis not present

## 2023-09-12 LAB — BASIC METABOLIC PANEL
Anion gap: 6 (ref 5–15)
BUN: 19 mg/dL (ref 6–20)
CO2: 27 mmol/L (ref 22–32)
Calcium: 8.2 mg/dL — ABNORMAL LOW (ref 8.9–10.3)
Chloride: 108 mmol/L (ref 98–111)
Creatinine, Ser: 0.72 mg/dL (ref 0.44–1.00)
GFR, Estimated: >60 mL/min (ref >60)
Glucose, Bld: 85 mg/dL (ref 70–99)
Potassium: 4.2 mmol/L (ref 3.5–5.1)
Sodium: 141 mmol/L (ref 135–145)

## 2023-09-12 LAB — CBC
HCT: 35.6 % — ABNORMAL LOW (ref 36.0–46.0)
Hemoglobin: 11.7 g/dL — ABNORMAL LOW (ref 12.0–15.0)
MCH: 29.3 pg (ref 26.0–34.0)
MCHC: 32.9 g/dL (ref 30.0–36.0)
MCV: 89 fL (ref 80.0–100.0)
Platelets: 178 10*3/uL (ref 150–400)
RBC: 4 MIL/uL (ref 3.87–5.11)
RDW: 14.6 % (ref 11.5–15.5)
WBC: 7.2 10*3/uL (ref 4.0–10.5)
nRBC: 0 % (ref 0.0–0.2)

## 2023-09-12 LAB — LIPID PANEL
Cholesterol: 214 mg/dL — ABNORMAL HIGH (ref 0–200)
HDL: 55 mg/dL (ref >40)
LDL Cholesterol: 128 mg/dL — ABNORMAL HIGH (ref 0–99)
Total CHOL/HDL Ratio: 3.9 {ratio}
Triglycerides: 153 mg/dL — ABNORMAL HIGH (ref <150)
VLDL: 31 mg/dL (ref 0–40)

## 2023-09-12 LAB — MAGNESIUM: Magnesium: 1.8 mg/dL (ref 1.7–2.4)

## 2023-09-12 LAB — CK: Total CK: 1125 U/L — ABNORMAL HIGH (ref 38–234)

## 2023-09-12 MED ORDER — SODIUM CHLORIDE 0.9 % IV SOLN
INTRAVENOUS | Status: DC
Start: 2023-09-12 — End: 2023-09-13

## 2023-09-12 MED ORDER — METHOCARBAMOL 500 MG PO TABS
1000.0000 mg | ORAL_TABLET | Freq: Four times a day (QID) | ORAL | Status: DC | PRN
  Administered 2023-09-12 – 2023-09-13 (×3): 1000 mg via ORAL
  Filled 2023-09-12 (×3): qty 2

## 2023-09-12 NOTE — Progress Notes (Signed)
  PROGRESS NOTE    Tracy Phillips  WUJ:[id number] DOB: 03/25/1966 DOA: 09/10/2023 PCP: Dale [place], MD  249A/249A-AA  LOS: 2 days   Brief hospital course:   Assessment & Plan: Tracy Phillips is a 57 y.o. female with medical history significant for Alcohol use disorder with prior related hospitalizations most recently 03/2023, anxiety and depression, and cocaine use who presents to the ED with chest pain and nausea that started after smoking crack for about 2 hours.  On arrival of EMS she was very anxious and hyperventilating with a pulse of 121.    * Chest pain, ACS ruled out Elevated troponin with flat trend 107, 106, 32 Suspect related to crack cocaine use Cardiology consulted.  Echo unremarkable Plan: --f/u with outpatient cardio PRN  Rhabdomyolysis --2/2 cocaine use.  CK 2562 with symptoms of muscle soreness. --CK trended down with MIVF --cont MIVF --robaxin PRN   AKI --likely due to rhabomyolysis --Cr 1.34 on presentation.  Trended down to 0.72 with MIVF. --cont MIVF  Cocaine use --cessation advised  Alcohol use disorder CIWA withdrawal protocol  Anxiety and depression Continue Celexa Cymbalta and Seroquel   DVT prophylaxis: Lovenox SQ Code Status: Full code  Family Communication:  Level of care: Progressive Dispo:   The patient is from: home Anticipated d/c is to: home Anticipated d/c date is: tomorrow   Subjective and Interval History:  Pt continued to complain of a lot of pain and refused to get out of bed.  Also reported anxiety and asking for Valium.   Objective: Vitals:   09/12/23 0341 09/12/23 0817 09/12/23 1100 09/12/23 1400  BP: 118/75 134/74 116/68 119/74  Pulse: 78 67 70 81  Resp: 17 16    Temp: 98.4 F (36.9 C) 98.4 F (36.9 C) 98.4 F (36.9 C) 97.6 F (36.4 C)  TempSrc:  Oral Oral Oral  SpO2: 99% 100% 97% 98%  Weight:      Height:        Intake/Output Summary (Last 24 hours) at 09/12/2023 1604 Last data filed at  09/12/2023 0600 Gross per 24 hour  Intake 1528.16 ml  Output 650 ml  Net 878.16 ml   Filed Weights   09/10/23 1739  Weight: 70.3 kg    Examination:   Constitutional: NAD, AAOx3 HEENT: conjunctivae and lids normal, EOMI CV: No cyanosis.   RESP: normal respiratory effort, on RA Neuro: II - XII grossly intact.     Data Reviewed: I have personally reviewed labs and imaging studies  Time spent: 35 minutes  Darlin Priestly, MD [facility] If 7PM-7AM, please contact night-coverage 09/12/2023, 4:04 PM

## 2023-09-12 NOTE — Progress Notes (Signed)
Pt still has occasional mild nausea. IV Zofran given. Pt still eating/drinking.

## 2023-09-12 NOTE — TOC Initial Note (Signed)
Transition of Care [facility]) - Initial/Assessment Note    Patient Details  Name: Tracy Phillips MRN: [id number] Date of Birth: [birth year]  Transition of Care [facility]) CM/SW Contact:    Darolyn Rua, LCSW Phone Number: 09/12/2023, 11:23 AM  Clinical Narrative:                  CSW met with patient at bedside, confirmed PCP is Dr.Scott, she reports she has family that can pick her up at time of discharge and [facility] [address].  Declines Substance Abuse resources at this time.   Expected Discharge Plan: Home/Self Care Barriers to Discharge: Continued Medical Work up   Patient Goals and CMS Choice Patient states their goals for this hospitalization and ongoing recovery are:: to go home CMS Medicare.gov Compare Post Acute Care list provided to:: Patient Choice offered to / list presented to : Patient      Expected Discharge Plan and Services       Living arrangements for the past 2 months: Single Family Home                                      Prior Living Arrangements/Services Living arrangements for the past 2 months: Single Family Home Lives with:: Self                   Activities of Daily Living   ADL Screening (condition at time of admission) Independently performs ADLs?: Yes (appropriate for developmental age) Is the patient deaf or have difficulty hearing?: No Does the patient have difficulty seeing, even when wearing glasses/contacts?: No Does the patient have difficulty concentrating, remembering, or making decisions?: No  Permission Sought/Granted                  Emotional Assessment              Admission diagnosis:  NSTEMI (non-ST elevated myocardial infarction) (HCC) [I21.4] Cocaine use [F14.90] Chest pain [R07.9] Patient Active Problem List   Diagnosis Date Noted   Demand ischemia (HCC) 09/11/2023   Chest pain 09/10/2023   AKI (acute kidney injury) (HCC) 09/10/2023   Cocaine use 09/10/2023   Elevated  troponin 09/10/2023   Nausea 06/22/2023   Encounter for screening for coronary artery disease 06/22/2023   Muscle ache 06/20/2023   B12 deficiency 06/20/2023   Alcohol use disorder 04/07/2023   GAD (generalized anxiety disorder) 04/07/2023   GERD (gastroesophageal reflux disease) 04/07/2023   MDD (major depressive disorder), recurrent severe, without psychosis (HCC) 04/06/2023   Alcohol withdrawal (HCC) 04/04/2023   Alcohol withdrawal delirium (HCC) 04/04/2023   Major depressive disorder, recurrent episode, severe (HCC) 04/03/2023   Suicidal ideation 04/03/2023   Alcoholic intoxication with complication (HCC) 04/03/2023   Hyperglycemia 05/02/2019   Healthcare maintenance 09/26/2017   Anxiety and depression 09/07/2017   Hot flashes 09/07/2017   Alcohol abuse 11/07/2013   Headache 11/07/2013   Hypercholesterolemia 11/07/2013   Kidney stones 11/07/2013   History of colonic polyps 11/07/2013   Herpes simplex 11/07/2013   PCP:  Dale [place], MD Pharmacy[id number], [place] - [address] [place] [address] Phone: [phone] Fax: [phone]     Social Determinants of Health (SDOH) Social History: SDOH Screenings   Food Insecurity: No Food Insecurity (09/10/2023)  Housing: Patient Declined (09/10/2023)  Transportation Needs: No Transportation Needs (  09/10/2023)  Utilities: Not At Risk (09/10/2023)  Alcohol Screen: High Risk (04/07/2023)  Depression (PHQ2-9): High Risk (06/20/2023)  Tobacco Use: Low Risk  (09/10/2023)   SDOH Interventions:     Readmission Risk Interventions     No data to display

## 2023-09-12 NOTE — Progress Notes (Signed)
Pt. Has been emotionally down today, feeling sad. Expressed this morning that she has formerly been a member of church and AA groups but has since self secluded. Pt. Denied substance abuse services and has been denying mobility services. I informed pt. That hospital does have a chaplain if she would ever like to talk to them, and how to request that service to their nurse.

## 2023-09-12 NOTE — Plan of Care (Signed)

## 2023-09-13 DIAGNOSIS — R079 Chest pain, unspecified: Secondary | ICD-10-CM | POA: Diagnosis not present

## 2023-09-13 LAB — LIPOPROTEIN A (LPA): Lipoprotein (a): 49 nmol/L — ABNORMAL HIGH (ref <75.0)

## 2023-09-13 MED ORDER — CITALOPRAM HYDROBROMIDE 20 MG PO TABS: 20.0000 mg | ORAL_TABLET | Freq: Every day | ORAL | 2 refills | Status: DC

## 2023-09-13 MED ORDER — FOLIC ACID 1 MG PO TABS: 1.0000 mg | ORAL_TABLET | Freq: Every day | ORAL | Status: DC

## 2023-09-13 MED ORDER — VITAMIN B-1 100 MG PO TABS: 100.0000 mg | ORAL_TABLET | Freq: Every day | ORAL | Status: DC

## 2023-09-13 MED ORDER — DULOXETINE HCL 30 MG PO CPEP: 90.0000 mg | ORAL_CAPSULE | Freq: Every day | ORAL | 2 refills | Status: DC

## 2023-09-13 MED ORDER — QUETIAPINE FUMARATE 100 MG PO TABS: 100.0000 mg | ORAL_TABLET | Freq: Every day | ORAL | 0 refills | Status: DC

## 2023-09-13 MED ORDER — METHOCARBAMOL 750 MG PO TABS: 750.0000 mg | ORAL_TABLET | Freq: Three times a day (TID) | ORAL | 0 refills | Status: DC | PRN

## 2023-09-13 NOTE — Discharge Summary (Signed)
Physician Discharge Summary   Tracy Phillips  female DOB: March 10, 1966  ZOX:[id number]  PCP: Dale [place], MD  Admit date: 09/10/2023 Discharge date: 09/13/2023  Admitted From: home Disposition:  home CODE STATUS: Full code   Hospital Course:  For full details, please see H&P, progress notes, consult notes and ancillary notes.  Briefly,  Tracy Phillips is a 57 y.o. female with medical history significant for Alcohol use disorder with prior related hospitalizations most recently 03/2023, anxiety and depression, and cocaine use who presented to the ED with chest pain and nausea that started after smoking crack for about 2 hours.  On arrival of EMS she was very anxious and hyperventilating with a pulse of 121.    * Chest pain, ACS ruled out Mild trop elevation 2/2 demand ischemia Elevated troponin with flat trend 107, 106, 32 Suspect related to crack cocaine use Cardiology consulted.  Echo unremarkable --f/u with outpatient cardio PRN   Rhabdomyolysis --2/2 cocaine use.  CK 2562 with symptoms of muscle soreness. --CK trended down with MIVF --robaxin PRN    AKI --likely due to rhabomyolysis --Cr 1.34 on presentation.  Trended down to 0.72 with MIVF.   Cocaine use --cessation advised   Alcohol use disorder --monitored on CIWA --start folic acid and thiamine   Anxiety and depression Continue Celexa Cymbalta and Seroquel  Acute metabolic acidosis  --resolved next day    Unless noted above, medications under "STOP" list are ones pt was not taking PTA.  Discharge Diagnoses:  Principal Problem:   Chest pain Active Problems:   Elevated troponin   Cocaine use   Anxiety and depression   Alcohol use disorder   Nausea   AKI (acute kidney injury) (HCC)   Demand ischemia (HCC)   30 Day Unplanned Readmission Risk Score    Flowsheet Row ED to Hosp-Admission (Current) from 09/10/2023 in [facility]  30 Day Unplanned Readmission Risk Score  (%) 23.17 Filed at 09/13/2023 0800       This score is the patient's risk of an unplanned readmission within 30 days of being discharged (0 -100%). The score is based on dignosis, age, lab data, medications, orders, and past utilization.   Low:  0-14.9   Medium: 15-21.9   High: 22-29.9   Extreme: 30 and above         Discharge Instructions:  Allergies as of 09/13/2023       Reactions   Aleve [naproxen Sodium] Hives        Medication List     STOP taking these medications    bisacodyl 10 MG suppository Commonly known as: Dulcolax       TAKE these medications    citalopram 20 MG tablet Commonly known as: CELEXA Take 1 tablet (20 mg total) by mouth daily.   DULoxetine 30 MG capsule Commonly known as: CYMBALTA Take 3 capsules (90 mg total) by mouth daily.   folic acid 1 MG tablet Commonly known as: FOLVITE Take 1 tablet (1 mg total) by mouth daily.   methocarbamol 750 MG tablet Commonly known as: ROBAXIN Take 1 tablet (750 mg total) by mouth every 8 (eight) hours as needed for muscle spasms.   ondansetron 4 MG tablet Commonly known as: Zofran Take 1 tablet (4 mg total) by mouth 2 (two) times daily as needed for nausea or vomiting.   pantoprazole 40 MG tablet Commonly known as: PROTONIX Take 1 tablet (40 mg total) by mouth daily.   QUEtiapine 100 MG  tablet Commonly known as: SEROQUEL Take 1 tablet (100 mg total) by mouth at bedtime.   thiamine 100 MG tablet Commonly known as: Vitamin B-1 Take 1 tablet (100 mg total) by mouth daily.         Follow-up Information     Dale [place], MD Follow up in 1 week(s).   Specialty: Internal Medicine Contact information: [address] [place] [address] [phone]                 Allergies  Allergen Reactions   Aleve [Naproxen Sodium] Hives     The results of significant diagnostics from this hospitalization (including imaging, microbiology, ancillary and  laboratory) are listed below for reference.   Consultations:   Procedures/Studies: ECHOCARDIOGRAM COMPLETE  Result Date: 09/11/2023    ECHOCARDIOGRAM REPORT   Patient Name:   Tracy Phillips Date of Exam: 09/11/2023 Medical Rec #:  [id number]        Height:       64.0 in Accession #:    [id number]       Weight:       155.0 lb Date of Birth:  1966/03/07        BSA:          1.756 m Patient Age:    56 years         BP:           102/65 mmHg Patient Gender: F                HR:           71 bpm. Exam Location:  [facility] Procedure: 2D Echo, Cardiac Doppler, Color Doppler and Strain Analysis Indications:     NSTEMI  History:         Patient has no prior history of Echocardiogram examinations.                  Acute MI, Signs/Symptoms:Chest Pain; Risk Factors:Non-Smoker.  Sonographer:     Mikki Harbor Referring Phys:  [id number] Andris Baumann Diagnosing Phys: Julien Nordmann MD  Sonographer Comments: Image acquisition challenging due to respiratory motion. Global longitudinal strain was attempted. IMPRESSIONS  1. Left ventricular ejection fraction, by estimation, is 60 to 65%. The left ventricle has normal function. The left ventricle has no regional wall motion abnormalities. Left ventricular diastolic parameters were normal. The average left ventricular global longitudinal strain is -15.8 %.  2. Right ventricular systolic function is normal. The right ventricular size is normal. There is normal pulmonary artery systolic pressure.  3. The mitral valve is normal in structure. Mild mitral valve regurgitation. No evidence of mitral stenosis.  4. Tricuspid valve regurgitation is mild to moderate.  5. The aortic valve has an indeterminant number of cusps. Aortic valve regurgitation is not visualized. No aortic stenosis is present.  6. The inferior vena cava is normal in size with greater than 50% respiratory variability, suggesting right atrial pressure of 3 mmHg. FINDINGS  Left Ventricle: Left ventricular ejection  fraction, by estimation, is 60 to 65%. The left ventricle has normal function. The left ventricle has no regional wall motion abnormalities. The average left ventricular global longitudinal strain is -15.8 %. The left ventricular internal cavity size was normal in size. There is no left ventricular hypertrophy. Left ventricular diastolic parameters were normal. Right Ventricle: The right ventricular size is normal. No increase in right ventricular wall thickness. Right ventricular systolic function is normal. There is normal pulmonary artery systolic  pressure. The tricuspid regurgitant velocity is 2.16 m/s, and  with an assumed right atrial pressure of 3 mmHg, the estimated right ventricular systolic pressure is 21.7 mmHg. Left Atrium: Left atrial size was normal in size. Right Atrium: Right atrial size was normal in size. Pericardium: There is no evidence of pericardial effusion. Mitral Valve: The mitral valve is normal in structure. Mild mitral valve regurgitation. No evidence of mitral valve stenosis. MV peak gradient, 4.7 mmHg. The mean mitral valve gradient is 2.0 mmHg. Tricuspid Valve: The tricuspid valve is normal in structure. Tricuspid valve regurgitation is mild to moderate. No evidence of tricuspid stenosis. Aortic Valve: The aortic valve has an indeterminant number of cusps. Aortic valve regurgitation is not visualized. No aortic stenosis is present. Aortic valve mean gradient measures 5.0 mmHg. Aortic valve peak gradient measures 11.0 mmHg. Aortic valve area, by VTI measures 2.51 cm. Pulmonic Valve: The pulmonic valve was normal in structure. Pulmonic valve regurgitation is not visualized. No evidence of pulmonic stenosis. Aorta: The aortic root is normal in size and structure. Venous: The inferior vena cava is normal in size with greater than 50% respiratory variability, suggesting right atrial pressure of 3 mmHg. IAS/Shunts: No atrial level shunt detected by color flow Doppler.  LEFT VENTRICLE PLAX  2D LVIDd:         4.00 cm   Diastology LVIDs:         2.50 cm   LV e' medial:    12.50 cm/s LV PW:         1.00 cm   LV E/e' medial:  6.5 LV IVS:        1.10 cm   LV e' lateral:   14.60 cm/s LVOT diam:     1.80 cm   LV E/e' lateral: 5.6 LV SV:         82 LV SV Index:   47        2D Longitudinal Strain LVOT Area:     2.54 cm  2D Strain GLS Avg:     -15.8 %  RIGHT VENTRICLE RV Basal diam:  3.30 cm RV Mid diam:    2.90 cm RV S prime:     12.60 cm/s TAPSE (M-mode): 2.2 cm LEFT ATRIUM             Index        RIGHT ATRIUM           Index LA diam:        3.50 cm 1.99 cm/m   RA Area:     13.40 cm LA Vol (A2C):   43.4 ml 24.72 ml/m  RA Volume:   33.80 ml  19.25 ml/m LA Vol (A4C):   51.6 ml 29.39 ml/m LA Biplane Vol: 47.6 ml 27.11 ml/m  AORTIC VALVE                     PULMONIC VALVE AV Area (Vmax):    2.31 cm      PV Vmax:       1.12 m/s AV Area (Vmean):   2.25 cm      PV Peak grad:  5.0 mmHg AV Area (VTI):     2.51 cm AV Vmax:           166.00 cm/s AV Vmean:          104.000 cm/s AV VTI:            0.326 m AV Peak Grad:  11.0 mmHg AV Mean Grad:      5.0 mmHg LVOT Vmax:         151.00 cm/s LVOT Vmean:        91.800 cm/s LVOT VTI:          0.321 m LVOT/AV VTI ratio: 0.98  AORTA Ao Root diam: 2.90 cm MITRAL VALVE               TRICUSPID VALVE MV Area (PHT): 3.13 cm    TR Peak grad:   18.7 mmHg MV Area VTI:   2.44 cm    TR Vmax:        216.00 cm/s MV Peak grad:  4.7 mmHg MV Mean grad:  2.0 mmHg    SHUNTS MV Vmax:       1.08 m/s    Systemic VTI:  0.32 m MV Vmean:      56.0 cm/s   Systemic Diam: 1.80 cm MV Decel Time: 242 msec MV E velocity: 81.70 cm/s MV A velocity: 93.60 cm/s MV E/A ratio:  0.87 Julien Nordmann MD Electronically signed by Julien Nordmann MD Signature Date/Time: 09/11/2023/5:02:59 PM    Final    DG Chest Port 1 View  Result Date: 09/10/2023 CLINICAL DATA:  Chest pain.  Drug use. EXAM: PORTABLE CHEST 1 VIEW COMPARISON:  01/10/2013 FINDINGS: The heart size and mediastinal contours are within  normal limits. Both lungs are clear. The visualized skeletal structures are unremarkable. IMPRESSION: No active disease. Electronically Signed   By: Burman Nieves M.D.   On: 09/10/2023 22:20      Labs: BNP (last 3 results) No results for input(s): "BNP" in the last 8760 hours. Basic Metabolic Panel: Recent Labs  Lab 09/10/23 1744 09/12/23 0428  NA 137 141  K 4.5 4.2  CL 102 108  CO2 18* 27  GLUCOSE 142* 85  BUN 30* 19  CREATININE 1.34* 0.72  CALCIUM 9.6 8.2*  MG  --  1.8   Liver Function Tests: No results for input(s): "AST", "ALT", "ALKPHOS", "BILITOT", "PROT", "ALBUMIN" in the last 168 hours. No results for input(s): "LIPASE", "AMYLASE" in the last 168 hours. No results for input(s): "AMMONIA" in the last 168 hours. CBC: Recent Labs  Lab 09/10/23 1744 09/11/23 0744 09/12/23 0428  WBC 17.2* 8.9 7.2  HGB 14.6 12.1 11.7*  HCT 44.1 35.8* 35.6*  MCV 88.2 86.5 89.0  PLT 268 206 178   Cardiac Enzymes: Recent Labs  Lab 09/11/23 0744 09/12/23 0428  CKTOTAL 2,562* 1,125*   BNP: Invalid input(s): "POCBNP" CBG: No results for input(s): "GLUCAP" in the last 168 hours. D-Dimer No results for input(s): "DDIMER" in the last 72 hours. Hgb A1c Recent Labs    09/11/23 0939  HGBA1C 5.5   Lipid Profile Recent Labs    09/12/23 0428  CHOL 214*  HDL 55  LDLCALC 128*  TRIG 153*  CHOLHDL 3.9   Thyroid function studies No results for input(s): "TSH", "T4TOTAL", "T3FREE", "THYROIDAB" in the last 72 hours.  Invalid input(s): "FREET3" Anemia work up No results for input(s): "VITAMINB12", "FOLATE", "FERRITIN", "TIBC", "IRON", "RETICCTPCT" in the last 72 hours. Urinalysis No results found for: "COLORURINE", "APPEARANCEUR", "LABSPEC", "PHURINE", "GLUCOSEU", "HGBUR", "BILIRUBINUR", "KETONESUR", "PROTEINUR", "UROBILINOGEN", "NITRITE", "LEUKOCYTESUR" Sepsis Labs Recent Labs  Lab 09/10/23 1744 09/11/23 0744 09/12/23 0428  WBC 17.2* 8.9 7.2   Microbiology No results  found for this or any previous visit (from the past 240 hour(s)).   Total time spend on discharging this patient, including the last patient  exam, discussing the hospital stay, instructions for ongoing care as it relates to all pertinent caregivers, as well as preparing the medical discharge records, prescriptions, and/or referrals as applicable, is 35 minutes.    Darlin Priestly, MD  [facility] 09/13/2023, 8:25 AM

## 2023-09-15 ENCOUNTER — Other Ambulatory Visit: Payer: Self-pay | Admitting: Internal Medicine

## 2023-09-15 ENCOUNTER — Telehealth: Payer: Self-pay

## 2023-09-15 NOTE — Transitions of Care (Post Inpatient/ED Visit) (Signed)
   09/15/2023  Name: Tracy Phillips MRN: [id number] DOB: 10/24/1966  Today's TOC FU Call Status: Today's TOC FU Call Status:: Unsuccessful Call (1st Attempt) Unsuccessful Call (1st Attempt) Date: 09/15/23  Attempted to reach the patient regarding the most recent Inpatient/ED visit.  Follow Up Plan: Additional outreach attempts will be made to reach the patient to complete the Transitions of Care (Post Inpatient/ED visit) call.   [name], RN Medical illustrator [facility] [phone]

## 2023-09-16 ENCOUNTER — Telehealth: Payer: Self-pay

## 2023-09-16 NOTE — Telephone Encounter (Signed)
This was a one time medication in 06/2023.  If there is a request for a refill, please call and see if something acute is going on (specifically why she is needing more zofran).  Need more information before refilling the medication.

## 2023-09-16 NOTE — Transitions of Care (Post Inpatient/ED Visit) (Signed)
   09/16/2023  Name: NIAYA KRIMMEL MRN: [id number] DOB: 03-Mar-1966  Today's TOC FU Call Status: Today's TOC FU Call Status:: Unsuccessful Call (2nd Attempt) Unsuccessful Call (2nd Attempt) Date: 09/16/23  Attempted to reach the patient regarding the most recent Inpatient/ED visit.  Follow Up Plan: No further outreach attempts will be made at this time. We have been unable to contact the patient.  [name], RN Medical illustrator [facility] [phone]

## 2023-09-17 ENCOUNTER — Telehealth: Payer: Self-pay

## 2023-09-17 NOTE — Patient Outreach (Signed)
  Care Management  Transitions of Care Program Transitions of Care Post-discharge Initial   09/17/2023 Name: Tracy Phillips MRN: [id number] DOB: 09-May-1966  Subjective: Tracy Phillips is a 57 y.o. year old female who is a primary care patient of Dale Ardmore, MD. The Care Management team Engaged with patient Engaged with patient by telephone to assess and address transitions of care needs.   Consent to Services:  Patient was given information about care management services, agreed to services, and gave verbal consent to participate.   Assessment:  Date of Discharge: 09/13/23 Discharge Facility: [facility]) Type of Discharge: Inpatient Admission Primary Inpatient Discharge Diagnosis:: Chest Pain  SDOH Interventions    Flowsheet Row Telephone from 09/17/2023 in [place] POPULATION HEALTH DEPARTMENT Office Visit from 04/21/2023 in [facility] [employer] [facility]  SDOH Interventions    Food Insecurity Interventions Intervention Not Indicated --  Transportation Interventions Intervention Not Indicated --  Depression Interventions/Treatment  -- Medication        Goals Addressed             This Visit's Progress    Patient Stated       Current Barriers:  Knowledge Deficits related to plan of care for management of Anxiety   RNCM Clinical Goal(s):  Patient will verbalize basic understanding of  Anxiety disease process and self health management plan as evidenced by abstinence of crack cocaine and no ED visit within the next 30 days  through collaboration with RN Care manager, provider, and care team.   Interventions: Evaluation of current treatment plan related to  self management and patient's adherence to plan as established by provider   Anxiety  (Status:  New goal.)  Short Term Goal Evaluation of current treatment plan related to Anxiety, Limited social support, Medication procurement, Mental Health Concerns , and  Social Isolation self-management and patient's adherence to plan as established by provider. Discussed plans with patient for ongoing care management follow up and provided patient with direct contact information for care management team Provided education to patient re: Substance abuse Discussed plans with patient for ongoing care management follow up and provided patient with direct contact information for care management team  Patient Goals/Self-Care Activities: Participate in Transition of Care Program/Attend [facility] scheduled calls Notify RN Care Manager of [facility] call rescheduling needs Take all medications as prescribed Attend all scheduled provider appointments Call provider office for new concerns or questions   Follow Up Plan:  Telephone follow up appointment with care management team member scheduled for:  Thursday November 7th 11:00am          Plan: Telephone follow up appointment with care management team member scheduled for: Thursday September 25, 2023  The patient is at home alone and states she is having anxiety. 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 She states she went to her other's home last night because she was feeling restless and is trying not to use. She uses the crack as a coping mechanism. RNCM did discuss the long term harmful side effects of continuing to abuse drugs and discussed NA and AA. The patient will schedule an appointment with PCP after 09/18/2023. She has a court date which will determine is she is allowed to drive or not. The patient is enrolled in the 68 day Outreach program  [name], Programmer, systems [facility] [phone]

## 2023-09-19 ENCOUNTER — Other Ambulatory Visit: Payer: Self-pay | Admitting: Internal Medicine

## 2023-09-19 ENCOUNTER — Encounter: Payer: Self-pay | Admitting: *Deleted

## 2023-09-19 NOTE — Telephone Encounter (Signed)
Rx refill on hold.  Message sent to pt to clarify if rx needed.

## 2023-09-25 ENCOUNTER — Telehealth: Payer: Self-pay | Admitting: Internal Medicine

## 2023-09-25 ENCOUNTER — Other Ambulatory Visit: Payer: Self-pay

## 2023-09-25 NOTE — Patient Instructions (Signed)
Visit Information  Thank you for taking time to visit with me today. Please don't hesitate to contact me if I can be of assistance to you before our next scheduled telephone appointment.  Our next appointment is by telephone on Thursday November 14th at 10:00  Following is a copy of your care plan:   Goals Addressed             This Visit's Progress    Patient Stated       Current Barriers:  Knowledge Deficits related to plan of care for management of Anxiety   RNCM Clinical Goal(s):  Patient will verbalize basic understanding of  Anxiety disease process and self health management plan as evidenced by abstinence of crack cocaine and no ED visit within the next 30 days demonstrate ongoing self health care management ability about anxiety and the triggers as evidenced by attending counseling session within the next 30 days through collaboration with RN Care manager, provider, and care team.   Interventions: Evaluation of current treatment plan related to  self management and patient's adherence to plan as established by provider   Anxiety  (Status:  New goal.)  Short Term Goal Evaluation of current treatment plan related to Anxiety, Limited social support, Medication procurement, Mental Health Concerns , and Social Isolation self-management and patient's adherence to plan as established by provider. Discussed plans with patient for ongoing care management follow up and provided patient with direct contact information for care management team Provided education to patient re: Substance abuse Discussed plans with patient for ongoing care management follow up and provided patient with direct contact information for care management team  Patient Goals/Self-Care Activities: Participate in Transition of Care Program/Attend [facility] scheduled calls Notify RN Care Manager of [facility] call rescheduling needs Take all medications as prescribed Attend all scheduled provider appointments Attend church  or other social activities Call provider office for new concerns or questions   Follow Up Plan:  Telephone follow up appointment with care management team member scheduled for:  Thursday November 14th 10:00am          Patient verbalizes understanding of instructions and care plan provided today and agrees to view in [place]. Active MyChart status and patient understanding of how to access instructions and care plan via MyChart confirmed with patient.     Please call the care guide team at [phone] if you need to cancel or reschedule your appointment.   Please call the Suicide and Crisis Lifeline: 988 call the [place] National Suicide Prevention Lifeline: 947 243 6863 or TTY: (907)265-6887 TTY 323 582 9315) to talk to a trained counselor if you are experiencing a Mental Health or Behavioral Health Crisis or need someone to talk to.  [name], RN Medical illustrator [facility] [phone]

## 2023-09-25 NOTE — Telephone Encounter (Signed)
Please call and confirm doing ok.  I am ok for hospital follow up.  She will need to get established with psychiatry as well.

## 2023-09-25 NOTE — Telephone Encounter (Signed)
Appointment made through MyChart;  Follow up from hospital admission.  Was released on Oct. 28 from signs of a possible heart attack. I'd also like my depression/anxiety meds discussed since I am no longer seeing a psychiatrist for those prescriptions.  Could we get patient in sooner?

## 2023-09-25 NOTE — Telephone Encounter (Signed)
Patient scheduled for HFU

## 2023-09-25 NOTE — Patient Outreach (Signed)
Care Management  Transitions of Care Program Transitions of Care Post-discharge week 2   09/25/2023 Name: Tracy Phillips MRN: [id number] DOB: 1966-04-13  Subjective: Tracy Phillips is a 57 y.o. year old female who is a primary care patient of Dale Regina, MD. The Care Management team Engaged with patient Engaged with patient by telephone to assess and address transitions of care needs.   Consent to Services:  Patient was given information about care management services, agreed to services, and gave verbal consent to participate.   Assessment:           SDOH Interventions    Flowsheet Row Patient Outreach from 09/25/2023 in [place] POPULATION HEALTH DEPARTMENT Telephone from 09/17/2023 in [place] POPULATION HEALTH DEPARTMENT Office Visit from 04/21/2023 in [facility] [employer]  SDOH Interventions     Food Insecurity Interventions Intervention Not Indicated Intervention Not Indicated --  Housing Interventions Intervention Not Indicated -- --  Transportation Interventions Intervention Not Indicated Intervention Not Indicated --  Utilities Interventions Intervention Not Indicated -- --  Alcohol Usage Interventions Intervention Not Indicated (Score <7) -- --  Depression Interventions/Treatment  Counseling -- Medication  Financial Strain Interventions Intervention Not Indicated -- --  Physical Activity Interventions Intervention Not Indicated -- --  Stress Interventions Provide Counseling, Other (Comment)  [Provided resources on how to find a cousnelor] -- --  Social Connections Interventions Intervention Not Indicated -- --  Health Literacy Interventions Intervention Not Indicated -- --        Goals Addressed             This Visit's Progress    Patient Stated       Current Barriers:  Knowledge Deficits related to plan of care for management of Anxiety   RNCM Clinical Goal(s):  Patient will verbalize basic understanding of   Anxiety disease process and self health management plan as evidenced by abstinence of crack cocaine and no ED visit within the next 30 days demonstrate ongoing self health care management ability about anxiety and the triggers as evidenced by attending counseling session within the next 30 days through collaboration with RN Care manager, provider, and care team.   Interventions: Evaluation of current treatment plan related to  self management and patient's adherence to plan as established by provider   Anxiety  (Status:  New goal.)  Short Term Goal Evaluation of current treatment plan related to Anxiety, Limited social support, Medication procurement, Mental Health Concerns , and Social Isolation self-management and patient's adherence to plan as established by provider. Discussed plans with patient for ongoing care management follow up and provided patient with direct contact information for care management team Provided education to patient re: Substance abuse Discussed plans with patient for ongoing care management follow up and provided patient with direct contact information for care management team  Patient Goals/Self-Care Activities: Participate in Transition of Care Program/Attend [facility] scheduled calls Notify RN Care Manager of [facility] call rescheduling needs Take all medications as prescribed Attend all scheduled provider appointments Attend church or other social activities Call provider office for new concerns or questions   Follow Up Plan:  Telephone follow up appointment with care management team member scheduled for:  Thursday November 14th 10:00am          Plan: The patient will call her PCP as advised to follow up after hospital stay and for managing behavioral psych meds.   Talked with the patient today who states that her court date went  well and she is allowed to drive from 6am until 8pm for one year. She states that this improved her anxiety greatly. She is interested in a  behavioral health specialist for talk therapy. Suggested Psychology today website. She reports that she is not using crack cocaine and she is not drinking. RNCM to follow up with the patient in one week.   [name], RN Medical illustrator [facility]Population [facility] [phone]

## 2023-10-01 ENCOUNTER — Telehealth: Payer: BC Managed Care – PPO | Admitting: Internal Medicine

## 2023-10-01 NOTE — Progress Notes (Deleted)
Patient ID: Tracy Phillips, female   DOB: 1966-06-26, 57 y.o.   MRN: [id number]   Virtual Visit via *** Note  I connected withNAME@ today at 11:00 AM EST by a video enabled telemedicine application or telephone and verified that I am speaking with the correct person using two identifiers. Location patient: home Location provider: work or home office Persons participating in the virtual visit: patient, provider  I discussed the limitations, risks, security and privacy concerns of performing an evaluation and management service by telephone and the availability of in person appointments. I also discussed with the patient that there may be a patient responsible charge related to this service. The patient expressed understanding and agreed to proceed.  Interactive audio and video telecommunications were attempted between this provider and patient, however failed, due to patient having technical difficulties OR patient did not have access to video capability.  We continued and completed visit with audio only. ***  Reason for visit: ***  HPI: Hospital follow up.  Admitted 09/10/23 - 09/13/23 - after presenting with chest pain and nausea - after crack cocaine use.  Cardiology consulted.  Echo - unremarkable. CK elevated.  Trended down with IVFs.  Discharge creatinine .72.  Has been on cymbalta, celexa and seroquel.    ROS: See pertinent positives and negatives per HPI.  Past Medical History:  Diagnosis Date   Depression    H/O dizziness    H/O eating disorder    H/O: alcohol abuse    Herpes    H/O   History of chicken pox    History of colon polyps    Hyperlipidemia    Hypertension    Kidney stones    H/O stones    Past Surgical History:  Procedure Laterality Date   ABDOMINAL HYSTERECTOMY  1998   APPENDECTOMY  1997   DILATION AND CURETTAGE OF UTERUS     TONSILLECTOMY  1997    Family History  Problem Relation Age of Onset   Hyperlipidemia Mother    Hypertension Mother     Breast cancer Mother 74   Arthritis Father    Hyperlipidemia Father    Hypertension Father    Diabetes Father    Hypertension Brother    Arthritis Maternal Aunt    Hyperlipidemia Maternal Aunt    Hypertension Maternal Aunt    Alcohol abuse Maternal Uncle    Arthritis Maternal Uncle    Hyperlipidemia Maternal Uncle    Hypertension Maternal Uncle    Arthritis Paternal Aunt    Hyperlipidemia Paternal Aunt    Hypertension Paternal Aunt    Arthritis Paternal Uncle    Hyperlipidemia Paternal Uncle    Hypertension Paternal Uncle    Arthritis Maternal Grandmother    Hyperlipidemia Maternal Grandmother    Hypertension Maternal Grandmother    Arthritis Maternal Grandfather    Lung cancer Maternal Grandfather    Prostate cancer Maternal Grandfather    Hyperlipidemia Maternal Grandfather    Stroke Maternal Grandfather    Hypertension Maternal Grandfather    Arthritis Paternal Grandmother    Hyperlipidemia Paternal Grandmother    Hypertension Paternal Grandmother    Alcohol abuse Paternal Grandfather    Arthritis Paternal Grandfather    Hyperlipidemia Paternal Grandfather    Hypertension Paternal Grandfather    Sudden death Paternal Grandfather     SOCIAL HX: ***   Current Outpatient Medications:    citalopram (CELEXA) 20 MG tablet, Take 1 tablet (20 mg total) by mouth daily., Disp: 30 tablet, Rfl: 2  DULoxetine (CYMBALTA) 30 MG capsule, Take 3 capsules (90 mg total) by mouth daily., Disp: 90 capsule, Rfl: 2   folic acid (FOLVITE) 1 MG tablet, Take 1 tablet (1 mg total) by mouth daily., Disp: , Rfl:    methocarbamol (ROBAXIN) 750 MG tablet, Take 1 tablet (750 mg total) by mouth every 8 (eight) hours as needed for muscle spasms., Disp: 20 tablet, Rfl: 0   ondansetron (ZOFRAN) 4 MG tablet, Take 1 tablet (4 mg total) by mouth 2 (two) times daily as needed for nausea or vomiting., Disp: 14 tablet, Rfl: 0   pantoprazole (PROTONIX) 40 MG tablet, Take 1 tablet (40 mg total) by mouth  daily., Disp: 30 tablet, Rfl: 0   QUEtiapine (SEROQUEL) 100 MG tablet, Take 1 tablet (100 mg total) by mouth at bedtime., Disp: 30 tablet, Rfl: 0   thiamine (VITAMIN B-1) 100 MG tablet, Take 1 tablet (100 mg total) by mouth daily., Disp: , Rfl:   EXAM:  VITALS per patient if applicable:  GENERAL: alert, oriented, appears well and in no acute distress  HEENT: atraumatic, conjunttiva clear, no obvious abnormalities on inspection of external nose and ears  NECK: normal movements of the head and neck  LUNGS: on inspection no signs of respiratory distress, breathing rate appears normal, no obvious gross SOB, gasping or wheezing  CV: no obvious cyanosis  MS: moves all visible extremities without noticeable abnormality  PSYCH/NEURO: pleasant and cooperative, no obvious depression or anxiety, speech and thought processing grossly intact  ASSESSMENT AND PLAN:  Discussed the following assessment and plan:  Problem List Items Addressed This Visit   None   No follow-ups on file.   I discussed the assessment and treatment plan with the patient. The patient was provided an opportunity to ask questions and all were answered. The patient agreed with the plan and demonstrated an understanding of the instructions.   The patient was advised to call back or seek an in-person evaluation if the symptoms worsen or if the condition fails to improve as anticipated.  I provided *** minutes of non-face-to-face time during this encounter.   Dale Angola, MD

## 2023-10-02 ENCOUNTER — Other Ambulatory Visit: Payer: Self-pay

## 2023-10-02 NOTE — Patient Outreach (Signed)
  Care Management  Transitions of Care Program Transitions of Care Post-discharge week 3  10/02/2023 Name: Tracy Phillips MRN: [id number] DOB: [birth year]  Subjective: Tracy Phillips is a 57 y.o. year old female who is a primary care patient of Dale [place], MD. The Care Management team was unable to reach the patient by phone to assess and address transitions of care needs.   Plan: Additional outreach attempts will be made to reach the patient enrolled in the [facility] Program (Post Inpatient/ED Visit).  [name], RN Medical illustrator [facility]Population [facility] [phone]

## 2023-10-08 ENCOUNTER — Other Ambulatory Visit: Payer: Self-pay

## 2023-10-08 NOTE — Patient Outreach (Signed)
  Care Management  Transitions of Care Program Transitions of Care Post-discharge week 4   10/08/2023 Name: Tracy Phillips MRN: [id number] DOB: January 23, 1966  Subjective: Tracy Phillips is a 57 y.o. year old female who is a primary care patient of Dale Prue, MD. The Care Management team Engaged with patient  The patient did not answer the phone  to assess and address transitions of care needs.   Consent to Services:  N/A  Assessment:           SDOH Interventions    Flowsheet Row Patient Outreach from 09/25/2023 in [place] POPULATION HEALTH DEPARTMENT Telephone from 09/17/2023 in  POPULATION HEALTH DEPARTMENT Office Visit from 04/21/2023 in [facility] [place] [facility] [employer]  SDOH Interventions     Food Insecurity Interventions Intervention Not Indicated Intervention Not Indicated --  Housing Interventions Intervention Not Indicated -- --  Transportation Interventions Intervention Not Indicated Intervention Not Indicated --  Utilities Interventions Intervention Not Indicated -- --  Alcohol Usage Interventions Intervention Not Indicated (Score <7) -- --  Depression Interventions/Treatment  Counseling -- Medication  Financial Strain Interventions Intervention Not Indicated -- --  Physical Activity Interventions Intervention Not Indicated -- --  Stress Interventions Provide Counseling, Other (Comment)  [Provided resources on how to find a cousnelor] -- --  Social Connections Interventions Intervention Not Indicated -- --  Health Literacy Interventions Intervention Not Indicated -- --        Goals Addressed             This Visit's Progress    COMPLETED: Patient Stated       Current Barriers:  Knowledge Deficits related to plan of care for management of Anxiety   RNCM Clinical Goal(s):  Patient will verbalize basic understanding of  Anxiety disease process and self health management plan as evidenced by abstinence of crack cocaine and  no ED visit within the next 30 days demonstrate ongoing self health care management ability about anxiety and the triggers as evidenced by attending counseling session within the next 30 days through collaboration with RN Care manager, provider, and care team.   Interventions: Evaluation of current treatment plan related to  self management and patient's adherence to plan as established by provider   Anxiety  (Status:  New goal.)  Short Term Goal Evaluation of current treatment plan related to Anxiety, Limited social support, Medication procurement, Mental Health Concerns , and Social Isolation self-management and patient's adherence to plan as established by provider. Discussed plans with patient for ongoing care management follow up and provided patient with direct contact information for care management team Provided education to patient re: Substance abuse Discussed plans with patient for ongoing care management follow up and provided patient with direct contact information for care management team  Patient Goals/Self-Care Activities: Participate in Transition of Care Program/Attend [facility] scheduled calls Notify RN Care Manager of [facility] call rescheduling needs Take all medications as prescribed Attend all scheduled provider appointments Attend church or other social activities Call provider office for new concerns or questions   Follow Up Plan:  Telephone follow up appointment with care management team member scheduled for:   No further attempts will be made. The patient has miss the last three Outreach calls Goals not met         The patient has not been successfully outreach in the last three attempts. No further Outreach at this time.   [name], RN Medical illustrator [facility]Population [facility] [phone]

## 2023-10-09 ENCOUNTER — Other Ambulatory Visit: Payer: Self-pay

## 2023-10-09 ENCOUNTER — Emergency Department: Payer: BC Managed Care – PPO

## 2023-10-09 ENCOUNTER — Encounter: Payer: Self-pay | Admitting: Emergency Medicine

## 2023-10-09 ENCOUNTER — Emergency Department
Admission: EM | Admit: 2023-10-09 | Discharge: 2023-10-09 | Disposition: A | Discharge status: Home / Self Care | Payer: BC Managed Care – PPO | Attending: Emergency Medicine | Admitting: Emergency Medicine

## 2023-10-09 DIAGNOSIS — R079 Chest pain, unspecified: Secondary | ICD-10-CM | POA: Diagnosis not present

## 2023-10-09 DIAGNOSIS — I10 Essential (primary) hypertension: Secondary | ICD-10-CM | POA: Diagnosis not present

## 2023-10-09 DIAGNOSIS — R0789 Other chest pain: Secondary | ICD-10-CM | POA: Diagnosis not present

## 2023-10-09 LAB — BASIC METABOLIC PANEL
Anion gap: 13 (ref 5–15)
BUN: 8 mg/dL (ref 6–20)
CO2: 23 mmol/L (ref 22–32)
Calcium: 9.3 mg/dL (ref 8.9–10.3)
Chloride: 102 mmol/L (ref 98–111)
Creatinine, Ser: 0.69 mg/dL (ref 0.44–1.00)
GFR, Estimated: >60 mL/min (ref >60)
Glucose, Bld: 105 mg/dL — ABNORMAL HIGH (ref 70–99)
Potassium: 3.1 mmol/L — ABNORMAL LOW (ref 3.5–5.1)
Sodium: 138 mmol/L (ref 135–145)

## 2023-10-09 LAB — CBC
HCT: 42.4 % (ref 36.0–46.0)
Hemoglobin: 14.5 g/dL (ref 12.0–15.0)
MCH: 28.8 pg (ref 26.0–34.0)
MCHC: 34.2 g/dL (ref 30.0–36.0)
MCV: 84.3 fL (ref 80.0–100.0)
Platelets: 293 10*3/uL (ref 150–400)
RBC: 5.03 MIL/uL (ref 3.87–5.11)
RDW: 13.6 % (ref 11.5–15.5)
WBC: 6.4 10*3/uL (ref 4.0–10.5)
nRBC: 0 % (ref 0.0–0.2)

## 2023-10-09 LAB — TROPONIN I (HIGH SENSITIVITY)
Troponin I (High Sensitivity): 4 ng/L (ref <18)
Troponin I (High Sensitivity): 4 ng/L (ref <18)

## 2023-10-09 MED ORDER — METHOCARBAMOL 750 MG PO TABS: 750.0000 mg | ORAL_TABLET | Freq: Three times a day (TID) | ORAL | 1 refills | Status: DC | PRN

## 2023-10-09 MED ORDER — LORAZEPAM 1 MG PO TABS
1.0000 mg | ORAL_TABLET | Freq: Once | ORAL | Status: AC
  Administered 2023-10-09: 1 mg via ORAL
  Filled 2023-10-09: qty 1

## 2023-10-09 MED ORDER — METOCLOPRAMIDE HCL 10 MG PO TABS
10.0000 mg | ORAL_TABLET | Freq: Once | ORAL | Status: AC
  Administered 2023-10-09: 10 mg via ORAL
  Filled 2023-10-09: qty 1

## 2023-10-09 MED ORDER — ONDANSETRON 4 MG PO TBDP
4.0000 mg | ORAL_TABLET | Freq: Once | ORAL | Status: AC
  Administered 2023-10-09: 4 mg via ORAL
  Filled 2023-10-09: qty 1

## 2023-10-09 MED ORDER — ONDANSETRON 4 MG PO TBDP: 4.0000 mg | ORAL_TABLET | Freq: Three times a day (TID) | ORAL | 0 refills | Status: DC | PRN

## 2023-10-09 MED ORDER — PANTOPRAZOLE SODIUM 40 MG PO TBEC: 40.0000 mg | DELAYED_RELEASE_TABLET | Freq: Every day | ORAL | 2 refills | Status: DC

## 2023-10-09 NOTE — ED Triage Notes (Signed)
Patient to ED via POV for centralized chest pain. States it feels like a constant pressure. Ongoing for a few days. States she was recently here for same a few weeks ago and had elevated troponin's. Also having nausea.  Hx of anxiety

## 2023-10-09 NOTE — ED Provider Notes (Signed)
[facility] Emergency Department Provider Note     None    (approximate)   History   Chest Pain   HPI  Tracy Phillips is a 57 y.o. female with a history of alcohol use, hypercholesterolemia, HTN, depression, and alcohol use disorder, presents to the ED via personal vehicle for evaluation of centralized chest pain.  She describes it as a constant pressure.  She reports several days of symptoms intermittently.  She was seen for the same a few weeks ago noted to have elevated troponins.  Chart review reveals patient did have what appeared to be an acute NSTEMI, complicated by cocaine use disorder.  Patient presents today, noting some anxiety related to her current chest pain complaints and her recent diagnosis and hospitalization.   Physical Exam   Triage Vital Signs: ED Triage Vitals  Encounter Vitals Group     BP 10/09/23 1314 (!) 165/97     Systolic BP Percentile --      Diastolic BP Percentile --      Pulse Rate 10/09/23 1314 84     Resp 10/09/23 1314 18     Temp 10/09/23 1314 98.9 F (37.2 C)     Temp Source 10/09/23 1314 Oral     SpO2 10/09/23 1314 100 %     Weight 10/09/23 1312 153 lb (69.4 kg)     Height 10/09/23 1312 5\' 3"  (1.6 m)     Head Circumference --      Peak Flow --      Pain Score 10/09/23 1311 8     Pain Loc --      Pain Education --      Exclude from Growth Chart --     Most recent vital signs: Vitals:   10/09/23 1700 10/09/23 1917  BP: (!) 153/84 (!) 145/73  Pulse: 80 81  Resp:  16  Temp:  98.5 F (36.9 C)  SpO2: 95% 98%    General Awake, no distress. NAD HEENT NCAT. PERRL. EOMI. No rhinorrhea. Mucous membranes are moist.  CV:  Good peripheral perfusion. RRR RESP:  Normal effort. CTA ABD:  No distention.    ED Results / Procedures / Treatments   Labs (all labs ordered are listed, but only abnormal results are displayed) Labs Reviewed  BASIC METABOLIC PANEL - Abnormal; Notable for the following components:       Result Value   Potassium 3.1 (*)    Glucose, Bld 105 (*)    All other components within normal limits  CBC  TROPONIN I (HIGH SENSITIVITY)  TROPONIN I (HIGH SENSITIVITY)     EKG  Vent. rate 81 BPM PR interval 124 ms QRS duration 88 ms QT/QTcB 408/473 ms P-R-T axes 82 79 83 Normal sinus rhythm Right atrial enlargement Borderline ECG When compared with ECG of 12-Sep-2023 08:22, QT has lengthened   RADIOLOGY  I personally viewed and evaluated these images as part of my medical decision making, as well as reviewing the written report by the radiologist.  ED Provider Interpretation: No acute findings  CXR 2V  IMPRESSION: *No active cardiopulmonary disease.  PROCEDURES:  Critical Care performed: No  Procedures   MEDICATIONS ORDERED IN ED: Medications  ondansetron (ZOFRAN-ODT) disintegrating tablet 4 mg (4 mg Oral Given 10/09/23 1449)  LORazepam (ATIVAN) tablet 1 mg (1 mg Oral Given 10/09/23 1704)  metoCLOPramide (REGLAN) tablet 10 mg (10 mg Oral Given 10/09/23 1918)     IMPRESSION / MDM / ASSESSMENT AND PLAN /  ED COURSE  I reviewed the triage vital signs and the nursing notes.                              Differential diagnosis includes, but is not limited to, ACS, aortic dissection, pulmonary embolism, cardiac tamponade, pneumothorax, pneumonia, pericarditis, myocarditis, GI-related causes including esophagitis/gastritis, and musculoskeletal chest wall pain.    Patient's presentation is most consistent with acute complicated illness / injury requiring diagnostic workup.  Patient's diagnosis is consistent with nonspecific chest pain.  Patient with a reassuring cardiac workup at this time.  No electrolyte abnormalities, troponin normal and flat x 2, no malignant arrhythmias on EKG, and chest x-ray without evidence of intrathoracic process, based on interpretation.  Patient stable throughout her course in the ED with improved chest pain symptomology following  antianxiety medicine.  Patient is reassured by her negative chest workup at this time.  Patient will be discharged home with prescriptions for refills of her Robaxin, Zofran, and Protonix. Patient is to follow up with PCP as discussed, as needed or otherwise directed. Patient is given ED precautions to return to the ED for any worsening or new symptoms.   FINAL CLINICAL IMPRESSION(S) / ED DIAGNOSES   Final diagnoses:  Nonspecific chest pain     Rx / DC Orders   ED Discharge Orders          Ordered    ondansetron (ZOFRAN-ODT) 4 MG disintegrating tablet  Every 8 hours PRN        10/09/23 1928    pantoprazole (PROTONIX) 40 MG tablet  Daily        10/09/23 1928    methocarbamol (ROBAXIN) 750 MG tablet  Every 8 hours PRN        10/09/23 1928             Note:  This document was prepared using Dragon voice recognition software and may include unintentional dictation errors.    Lissa Hoard, PA-C 10/14/23 1943    Janith Lima, MD 10/14/23 [id number]

## 2023-10-09 NOTE — Discharge Instructions (Signed)
Your exam, labs, EKG, and chest x-ray are normal and reassuring at this time.  No signs of a cardiac related source for your symptoms.  No evidence of a heart attack, pneumonia, or other abnormalities.  Take your home meds as directed.  Follow-up with your primary provider for ongoing concerns.  Return to the ED if necessary.

## 2023-10-09 NOTE — Telephone Encounter (Signed)
Phone call to pt at the request of Dr. Lorin Picket.  Pt answered and reported that chest pain has not improved and she has decided to go to the hospital.  She is just getting out of the shower and has called her mother to come and drive her. Encouraged pt to call 911 but pt denied request and is having her mom drive her. Pt described pain as "excruciating." Let her know that she has made the correct decision to be evaluated.  Encouraged her to call office with any further needs.

## 2023-10-09 NOTE — Telephone Encounter (Signed)
Pt called stating she went to the hospital a couple weeks ago for some issues pertaining to her heart. Pt stated she missed some appointments with [facility]. Pt stated she was feeling better but now the issues have came back. Pt stated she has nausea, throwing up, anxiety, insomnia and her heart is tight. Pt stated she did not want to go back to the ER so she does not know what to do. Sent to access nurse

## 2023-10-09 NOTE — ED Notes (Signed)
Lab called to attempt to do troponin--unable to obtain.

## 2023-10-09 NOTE — Telephone Encounter (Signed)
With her history and current symptoms, she needs to be evaluated now - ER.

## 2023-10-09 NOTE — Telephone Encounter (Signed)
Noted.  Thank you for calling. Please f/u and confirm she is evaluated.

## 2023-10-13 NOTE — Telephone Encounter (Signed)
Pt seen in ED 10/09/23

## 2023-10-23 ENCOUNTER — Ambulatory Visit: Payer: BC Managed Care – PPO | Admitting: Internal Medicine

## 2023-10-23 VITALS — BP 122/70 | HR 89 | Temp 98.0°F | Resp 16 | Ht 63.0 in | Wt 158.8 lb

## 2023-10-23 DIAGNOSIS — M2550 Pain in unspecified joint: Secondary | ICD-10-CM

## 2023-10-23 DIAGNOSIS — R748 Abnormal levels of other serum enzymes: Secondary | ICD-10-CM

## 2023-10-23 DIAGNOSIS — R739 Hyperglycemia, unspecified: Secondary | ICD-10-CM

## 2023-10-23 DIAGNOSIS — M25552 Pain in left hip: Secondary | ICD-10-CM

## 2023-10-23 DIAGNOSIS — M25551 Pain in right hip: Secondary | ICD-10-CM

## 2023-10-23 DIAGNOSIS — F1029 Alcohol dependence with unspecified alcohol-induced disorder: Secondary | ICD-10-CM

## 2023-10-23 DIAGNOSIS — E7800 Pure hypercholesterolemia, unspecified: Secondary | ICD-10-CM

## 2023-10-23 DIAGNOSIS — K219 Gastro-esophageal reflux disease without esophagitis: Secondary | ICD-10-CM

## 2023-10-23 DIAGNOSIS — F1490 Cocaine use, unspecified, uncomplicated: Secondary | ICD-10-CM

## 2023-10-23 DIAGNOSIS — M79605 Pain in left leg: Secondary | ICD-10-CM

## 2023-10-23 DIAGNOSIS — F332 Major depressive disorder, recurrent severe without psychotic features: Secondary | ICD-10-CM

## 2023-10-23 DIAGNOSIS — E876 Hypokalemia: Secondary | ICD-10-CM | POA: Diagnosis not present

## 2023-10-23 LAB — BASIC METABOLIC PANEL
BUN: 16 mg/dL (ref 6–23)
CO2: 27 meq/L (ref 19–32)
Calcium: 9.2 mg/dL (ref 8.4–10.5)
Chloride: 106 meq/L (ref 96–112)
Creatinine, Ser: 0.67 mg/dL (ref 0.40–1.20)
GFR: 98.63 mL/min (ref >60.00)
Glucose, Bld: 87 mg/dL (ref 70–99)
Potassium: 4.2 meq/L (ref 3.5–5.1)
Sodium: 140 meq/L (ref 135–145)

## 2023-10-23 LAB — C-REACTIVE PROTEIN: CRP: <1 mg/dL (ref 0.5–20.0)

## 2023-10-23 LAB — CK: Total CK: 28 U/L (ref 7–177)

## 2023-10-23 LAB — SEDIMENTATION RATE: Sed Rate: 9 mm/h (ref 0–30)

## 2023-10-23 MED ORDER — QUETIAPINE FUMARATE 50 MG PO TABS: 50.0000 mg | ORAL_TABLET | Freq: Every day | ORAL | 1 refills | Status: DC

## 2023-10-23 NOTE — Progress Notes (Signed)
Subjective:    Patient ID: Tracy Phillips, female    DOB: 13-Jun-1966, 57 y.o.   MRN: [id number]  Patient here for No chief complaint on file.   HPI Here for a scheduled follow up. Recently hospitalized from 09/10/23 - 09/13/23 - after presenting with chest pain and nausea after cocaine use. Elevated troponin with flat trend - 107, 106, 32. Cardiology consultred.  Echo unremarkable. Also found to have elevated CK - rhabdomyolysis - CK trended down with IVFs. Creatine improved with IVFs. Was started on folic acid and thiamine.  Recommended to continue on celexa, cymbalta and seroquel. She was reevaluated in ER 10/09/23 - with chest pain. Diagnosis felt to be consistent with nonspecific chest pain.  Patient with a reassuring cardiac workup.  No electrolyte abnormalities, troponin normal and flat x 2, no malignant arrhythmias on EKG, and chest x-ray without evidence of intrathoracic process, based on interpretation.  Patient stable throughout her course in the ED with improved chest pain symptomology following antianxiety medicine. Discharged with instructions to f/u here. Report that since her 08/2023 admission, she has not used cocaine and no other drugs. She reports she is no longer seeing psychiatry - due to missed appts. Is agreeable to f/u with psychiatry.  Wants to remain off cocaine and other drugs. She is taking cymbalta and citalopram.  Ran out of seroquel. Feels needs to be back on the medication. Felt slept better on the medication. No chest pain.  Breathing stable. No abdominal pain reported.  Main complaint today is hip pain - left hip worse. Feels cold weather has worsened symptoms. Has had intermittent issues with her hips and this feels similar to previous hip pain.  No known injury.  Has appt with rheumatology next week.  Needs potassium recheck.    Past Medical History:  Diagnosis Date   Depression    H/O dizziness    H/O eating disorder    H/O: alcohol abuse    Herpes    H/O    History of chicken pox    History of colon polyps    Hyperlipidemia    Hypertension    Kidney stones    H/O stones   Past Surgical History:  Procedure Laterality Date   ABDOMINAL HYSTERECTOMY  1998   APPENDECTOMY  1997   DILATION AND CURETTAGE OF UTERUS     TONSILLECTOMY  1997   Family History  Problem Relation Age of Onset   Hyperlipidemia Mother    Hypertension Mother    Breast cancer Mother 73   Arthritis Father    Hyperlipidemia Father    Hypertension Father    Diabetes Father    Hypertension Brother    Arthritis Maternal Aunt    Hyperlipidemia Maternal Aunt    Hypertension Maternal Aunt    Alcohol abuse Maternal Uncle    Arthritis Maternal Uncle    Hyperlipidemia Maternal Uncle    Hypertension Maternal Uncle    Arthritis Paternal Aunt    Hyperlipidemia Paternal Aunt    Hypertension Paternal Aunt    Arthritis Paternal Uncle    Hyperlipidemia Paternal Uncle    Hypertension Paternal Uncle    Arthritis Maternal Grandmother    Hyperlipidemia Maternal Grandmother    Hypertension Maternal Grandmother    Arthritis Maternal Grandfather    Lung cancer Maternal Grandfather    Prostate cancer Maternal Grandfather    Hyperlipidemia Maternal Grandfather    Stroke Maternal Grandfather    Hypertension Maternal Grandfather    Arthritis Paternal Grandmother  Hyperlipidemia Paternal Grandmother    Hypertension Paternal Grandmother    Alcohol abuse Paternal Grandfather    Arthritis Paternal Grandfather    Hyperlipidemia Paternal Grandfather    Hypertension Paternal Grandfather    Sudden death Paternal Grandfather    Social History   Socioeconomic History   Marital status: Single    Spouse name: Not on file   Number of children: Not on file   Years of education: Not on file   Highest education level: Not on file  Occupational History   Not on file  Tobacco Use   Smoking status: Never   Smokeless tobacco: Never  Vaping Use   Vaping status: Never Used  Substance  and Sexual Activity   Alcohol use: No    Comment: sober since 10/27/2010   Drug use: Not Currently    Types: "Crack" cocaine   Sexual activity: Not Currently  Other Topics Concern   Not on file  Social History Narrative   Not on file   Social Determinants of Health   Financial Resource Strain: Low Risk  (09/25/2023)   Overall Financial Resource Strain (CARDIA)    Difficulty of Paying Living Expenses: Not hard at all  Food Insecurity: No Food Insecurity (09/25/2023)   Hunger Vital Sign    Worried About Running Out of Food in the Last Year: Never true    Ran Out of Food in the Last Year: Never true  Transportation Needs: No Transportation Needs (09/25/2023)   PRAPARE - Administrator, Civil Service (Medical): No    Lack of Transportation (Non-Medical): No  Physical Activity: Inactive (09/25/2023)   Exercise Vital Sign    Days of Exercise per Week: 0 days    Minutes of Exercise per Session: 0 min  Stress: Stress Concern Present (09/25/2023)   [employer] of Occupational Health - Occupational Stress Questionnaire    Feeling of Stress : To some extent  Social Connections: Moderately Isolated (09/25/2023)   Social Connection and Isolation Panel [NHANES]    Frequency of Communication with Friends and Family: Once a week    Frequency of Social Gatherings with Friends and Family: Once a week    Attends Religious Services: More than 4 times per year    Active Member of [employer] or Organizations: Yes    Attends Engineer, structural: More than 4 times per year    Marital Status: Divorced     Review of Systems  Constitutional:  Negative for appetite change and unexpected weight change.  HENT:  Negative for congestion and sinus pressure.   Respiratory:  Negative for cough, chest tightness and shortness of breath.   Cardiovascular:  Negative for chest pain and palpitations.  Gastrointestinal:  Negative for abdominal pain, diarrhea, nausea and vomiting.   Genitourinary:  Negative for difficulty urinating and dysuria.  Musculoskeletal:        Hip pain as outlined.  Also reports leg pain, etc - but hips appear to be worse.   Skin:  Negative for color change and rash.  Neurological:  Negative for dizziness and headaches.  Psychiatric/Behavioral:  Negative for agitation and dysphoric mood.        Objective:     BP 122/70   Pulse 89   Temp 98 F (36.7 C)   Resp 16   Ht 5\' 3"  (1.6 m)   Wt 158 lb 12.8 oz (72 kg)   SpO2 98%   BMI 28.13 kg/m  Wt Readings from Last 3 Encounters:  10/23/23  158 lb 12.8 oz (72 kg)  10/09/23 153 lb (69.4 kg)  09/10/23 155 lb (70.3 kg)    Physical Exam Vitals reviewed.  Constitutional:      General: She is not in acute distress.    Appearance: Normal appearance.  HENT:     Head: Normocephalic and atraumatic.     Right Ear: External ear normal.     Left Ear: External ear normal.     Mouth/Throat:     Pharynx: No oropharyngeal exudate or posterior oropharyngeal erythema.  Eyes:     General: No scleral icterus.       Right eye: No discharge.        Left eye: No discharge.     Conjunctiva/sclera: Conjunctivae normal.  Neck:     Thyroid: No thyromegaly.  Cardiovascular:     Rate and Rhythm: Normal rate and regular rhythm.  Pulmonary:     Effort: No respiratory distress.     Breath sounds: Normal breath sounds. No wheezing.  Abdominal:     General: Bowel sounds are normal.     Palpations: Abdomen is soft.     Tenderness: There is no abdominal tenderness.  Musculoskeletal:        General: No swelling or tenderness.     Cervical back: Neck supple. No tenderness.     Comments: Increased pain with weight bearing - walking.   Lymphadenopathy:     Cervical: No cervical adenopathy.  Skin:    Findings: No erythema or rash.  Neurological:     Mental Status: She is alert.  Psychiatric:        Mood and Affect: Mood normal.        Behavior: Behavior normal.      Outpatient Encounter  Medications as of 10/23/2023  Medication Sig   QUEtiapine (SEROQUEL) 50 MG tablet Take 1 tablet (50 mg total) by mouth at bedtime.   citalopram (CELEXA) 20 MG tablet Take 1 tablet (20 mg total) by mouth daily.   DULoxetine (CYMBALTA) 30 MG capsule Take 3 capsules (90 mg total) by mouth daily.   methocarbamol (ROBAXIN) 750 MG tablet Take 1 tablet (750 mg total) by mouth every 8 (eight) hours as needed for muscle spasms.   ondansetron (ZOFRAN-ODT) 4 MG disintegrating tablet Take 1 tablet (4 mg total) by mouth every 8 (eight) hours as needed for nausea or vomiting.   pantoprazole (PROTONIX) 40 MG tablet Take 1 tablet (40 mg total) by mouth daily.   [DISCONTINUED] folic acid (FOLVITE) 1 MG tablet Take 1 tablet (1 mg total) by mouth daily.   [DISCONTINUED] QUEtiapine (SEROQUEL) 100 MG tablet Take 1 tablet (100 mg total) by mouth at bedtime.   [DISCONTINUED] thiamine (VITAMIN B-1) 100 MG tablet Take 1 tablet (100 mg total) by mouth daily.   No facility-administered encounter medications on file as of 10/23/2023.     Lab Results  Component Value Date   WBC 6.4 10/09/2023   HGB 14.5 10/09/2023   HCT 42.4 10/09/2023   PLT 293 10/09/2023   GLUCOSE 87 10/23/2023   CHOL 214 (H) 09/12/2023   TRIG 153 (H) 09/12/2023   HDL 55 09/12/2023   LDLDIRECT 132.0 05/13/2019   LDLCALC 128 (H) 09/12/2023   ALT 11 06/20/2023   AST 14 06/20/2023   NA 140 10/23/2023   K 4.2 10/23/2023   CL 106 10/23/2023   CREATININE 0.67 10/23/2023   BUN 16 10/23/2023   CO2 27 10/23/2023   TSH 2.288 04/08/2023   HGBA1C 5.5  09/11/2023    DG Chest 2 View  Result Date: 10/09/2023 CLINICAL DATA:  Chest pain.  Pressure. EXAM: CHEST - 2 VIEW COMPARISON:  09/10/2023. FINDINGS: Bilateral lung fields are clear. Bilateral costophrenic angles are clear. Normal cardio-mediastinal silhouette. No acute osseous abnormalities. The soft tissues are within normal limits. IMPRESSION: *No active cardiopulmonary disease. Electronically  Signed   By: Jules Schick M.D.   On: 10/09/2023 15:25       Assessment & Plan:  Arthralgia, unspecified joint -     CK -     Sedimentation rate -     C-reactive protein  Hypokalemia Assessment & Plan: Recheck potassium to confirm wnl.   Orders: -     Basic metabolic panel  Pain in both lower extremities  Severe episode of recurrent major depressive disorder, without psychotic features (HCC) Assessment & Plan: Currently on cymbalta and citalopram. Off seroquel.  Rx ran out. Feels needs to restart. Felt she slept better on this medication.  Denies recent cocaine use or drug use - none since discharge in 08/2023.[name] Kitchen discussed counseling and need for f/u with psychiatry.  Reviewed EKG - no QT prolongation.  Will start seroquel 50mg  q hs (was on 100mg ).  Follow closely.  Order for psychiatry referral.  No SI.   Orders: -     Ambulatory referral to Psychiatry  Hyperglycemia Assessment & Plan: Low carb diet and exercise.  Follow met b and a1c.    Hypercholesterolemia Assessment & Plan: Low cholesterol diet and exercise.  Follow lipid panel.    Gastroesophageal reflux disease, unspecified whether esophagitis present Assessment & Plan: Protonix.    Cocaine use Assessment & Plan: Discussed need to remain off.  Discussed psychiatry referral.  Agreeable.    Orders: -     Ambulatory referral to Psychiatry  Alcohol dependence with unspecified alcohol-induced disorder [facility]) Assessment & Plan: Not drinking currently.  Discussed AA and counseling.  Discussed the need for f/u with psychiatry. Agreeable.   Orders: -     Ambulatory referral to Psychiatry  Elevated CK Assessment & Plan: Noted during recent hospitalization.  Improving prior to discharge.  Given increased pain today, will check CK to confirm normal.  Continue to stay hydrated.    Bilateral hip pain Assessment & Plan: Describes joint/leg pain as outlined. States has had intermittent flares with cold weather -  previously. Will recheck CK as outlined.  Also check ESR and CRP.  Has appt with rheumatology in several days.  Follow.    Other orders -     QUEtiapine Fumarate; Take 1 tablet (50 mg total) by mouth at bedtime.  Dispense: 30 tablet; Refill: 1     Dale [place]set, MD

## 2023-10-26 ENCOUNTER — Encounter: Payer: Self-pay | Admitting: Internal Medicine

## 2023-10-26 DIAGNOSIS — E876 Hypokalemia: Secondary | ICD-10-CM | POA: Insufficient documentation

## 2023-10-26 DIAGNOSIS — M25559 Pain in unspecified hip: Secondary | ICD-10-CM | POA: Insufficient documentation

## 2023-10-26 DIAGNOSIS — R748 Abnormal levels of other serum enzymes: Secondary | ICD-10-CM | POA: Insufficient documentation

## 2023-10-26 NOTE — Assessment & Plan Note (Signed)
Protonix.  ?

## 2023-10-26 NOTE — Assessment & Plan Note (Signed)
Discussed need to remain off.  Discussed psychiatry referral.  Agreeable.

## 2023-10-26 NOTE — Assessment & Plan Note (Signed)
Low carb diet and exercise.  Follow met b and a1c.   

## 2023-10-26 NOTE — Assessment & Plan Note (Signed)
Not drinking currently.  Discussed AA and counseling.  Discussed the need for f/u with psychiatry. Agreeable.

## 2023-10-26 NOTE — Assessment & Plan Note (Signed)
Describes joint/leg pain as outlined. States has had intermittent flares with cold weather - previously. Will recheck CK as outlined.  Also check ESR and CRP.  Has appt with rheumatology in several days.  Follow.

## 2023-10-26 NOTE — Assessment & Plan Note (Signed)
Noted during recent hospitalization.  Improving prior to discharge.  Given increased pain today, will check CK to confirm normal.  Continue to stay hydrated.

## 2023-10-26 NOTE — Assessment & Plan Note (Signed)
Recheck potassium to confirm wnl.

## 2023-10-26 NOTE — Assessment & Plan Note (Signed)
Low cholesterol diet and exercise.  Follow lipid panel.   

## 2023-10-26 NOTE — Assessment & Plan Note (Signed)
Currently on cymbalta and citalopram. Off seroquel.  Rx ran out. Feels needs to restart. Felt she slept better on this medication.  Denies recent cocaine use or drug use - none since discharge in 08/2023.[name] Kitchen discussed counseling and need for f/u with psychiatry.  Reviewed EKG - no QT prolongation.  Will start seroquel 50mg  q hs (was on 100mg ).  Follow closely.  Order for psychiatry referral.  No SI.

## 2023-10-27 DIAGNOSIS — M791 Myalgia, unspecified site: Secondary | ICD-10-CM | POA: Diagnosis not present

## 2023-10-27 DIAGNOSIS — M5032 Other cervical disc degeneration, mid-cervical region, unspecified level: Secondary | ICD-10-CM | POA: Diagnosis not present

## 2023-10-27 DIAGNOSIS — M48061 Spinal stenosis, lumbar region without neurogenic claudication: Secondary | ICD-10-CM | POA: Diagnosis not present

## 2023-10-27 DIAGNOSIS — M47816 Spondylosis without myelopathy or radiculopathy, lumbar region: Secondary | ICD-10-CM | POA: Diagnosis not present

## 2023-10-27 DIAGNOSIS — M5136 Other intervertebral disc degeneration, lumbar region with discogenic back pain only: Secondary | ICD-10-CM | POA: Diagnosis not present

## 2023-10-27 DIAGNOSIS — M542 Cervicalgia: Secondary | ICD-10-CM | POA: Diagnosis not present

## 2023-10-27 DIAGNOSIS — M4802 Spinal stenosis, cervical region: Secondary | ICD-10-CM | POA: Diagnosis not present

## 2023-10-27 DIAGNOSIS — R768 Other specified abnormal immunological findings in serum: Secondary | ICD-10-CM | POA: Diagnosis not present

## 2023-10-27 DIAGNOSIS — I700 Atherosclerosis of aorta: Secondary | ICD-10-CM | POA: Diagnosis not present

## 2023-10-27 DIAGNOSIS — M519 Unspecified thoracic, thoracolumbar and lumbosacral intervertebral disc disorder: Secondary | ICD-10-CM | POA: Diagnosis not present

## 2023-11-03 ENCOUNTER — Telehealth: Payer: Self-pay | Admitting: Internal Medicine

## 2023-11-03 NOTE — Telephone Encounter (Signed)
Referral was placed from Dr Allena Katz. Patient is seeing ortho in the morning.

## 2023-11-03 NOTE — Telephone Encounter (Signed)
Message came through my chart scheduling.  I'm not sure if Dr. Allena Katz or Dr. Lorin Picket is sending a referral to an orthopedic surgeon which I was told is the next step.

## 2023-11-04 DIAGNOSIS — M47816 Spondylosis without myelopathy or radiculopathy, lumbar region: Secondary | ICD-10-CM | POA: Diagnosis not present

## 2023-11-04 DIAGNOSIS — M1612 Unilateral primary osteoarthritis, left hip: Secondary | ICD-10-CM | POA: Diagnosis not present

## 2023-11-04 DIAGNOSIS — R1032 Left lower quadrant pain: Secondary | ICD-10-CM | POA: Diagnosis not present

## 2023-11-05 ENCOUNTER — Telehealth: Payer: Self-pay

## 2023-11-05 NOTE — Telephone Encounter (Signed)
CRM

## 2023-11-05 NOTE — Telephone Encounter (Signed)
Reviewed records.  It appears that she saw Dr Yves Dill and was given prednisone for her back.  Need more information.  What does she need or want to know?  Thanks

## 2023-11-05 NOTE — Telephone Encounter (Signed)
Copied from CRM [id number]. Topic: Clinical - Medication Question >> Nov 05, 2023 12:57 PM Samuel Jester B wrote: Reason for CRM: PT would like to speak with Dr.Scott nurse regarding the medication Prednisone 10mg  that was prescribed by another provider other than her PCP.

## 2023-11-06 NOTE — Telephone Encounter (Signed)
Called patient to clarify. Was given prednisone by Burman Freestone. Did not tolerate. Made her severely depressed. Patient has stopped pred taper and has been instructed to let Whitney know. Patient gave verbal understanding.

## 2023-11-14 ENCOUNTER — Other Ambulatory Visit: Payer: Self-pay | Admitting: Internal Medicine

## 2023-11-17 DIAGNOSIS — M47816 Spondylosis without myelopathy or radiculopathy, lumbar region: Secondary | ICD-10-CM | POA: Diagnosis not present

## 2023-11-17 DIAGNOSIS — M1612 Unilateral primary osteoarthritis, left hip: Secondary | ICD-10-CM | POA: Diagnosis not present

## 2023-11-17 DIAGNOSIS — M51369 Other intervertebral disc degeneration, lumbar region without mention of lumbar back pain or lower extremity pain: Secondary | ICD-10-CM | POA: Diagnosis not present

## 2023-11-26 ENCOUNTER — Encounter: Payer: Self-pay | Admitting: Internal Medicine

## 2023-11-26 ENCOUNTER — Telehealth (INDEPENDENT_AMBULATORY_CARE_PROVIDER_SITE_OTHER): Payer: BC Managed Care – PPO | Admitting: Internal Medicine

## 2023-11-26 DIAGNOSIS — M25559 Pain in unspecified hip: Secondary | ICD-10-CM

## 2023-11-26 DIAGNOSIS — E7800 Pure hypercholesterolemia, unspecified: Secondary | ICD-10-CM | POA: Diagnosis not present

## 2023-11-26 DIAGNOSIS — F332 Major depressive disorder, recurrent severe without psychotic features: Secondary | ICD-10-CM | POA: Diagnosis not present

## 2023-11-26 DIAGNOSIS — K219 Gastro-esophageal reflux disease without esophagitis: Secondary | ICD-10-CM

## 2023-11-26 DIAGNOSIS — R739 Hyperglycemia, unspecified: Secondary | ICD-10-CM

## 2023-11-26 DIAGNOSIS — F1029 Alcohol dependence with unspecified alcohol-induced disorder: Secondary | ICD-10-CM

## 2023-11-26 DIAGNOSIS — M545 Low back pain, unspecified: Secondary | ICD-10-CM

## 2023-11-26 NOTE — Progress Notes (Deleted)
 Subjective:    Patient ID: Tracy Phillips, female    DOB: Mar 31, 1966, 58 y.o.   MRN: [id number]  Patient here for No chief complaint on file.   HPI Here for a scheduled follow up - to review -  She was reevaluated in ER 10/09/23 - with chest pain. Diagnosis felt to be consistent with nonspecific chest pain.  Patient with a reassuring cardiac workup.  No electrolyte abnormalities, troponin normal and flat x 2, no malignant arrhythmias on EKG, and chest x-ray without evidence of intrathoracic process, based on interpretation.  Patient stable throughout her course in the ED with improved chest pain symptomology following antianxiety medicine. Discharged with instructions to f/u here. Report that since her 08/2023 admission, she has not used cocaine and no other drugs. She reports she is no longer seeing psychiatry - due to missed appts. Is agreeable to f/u with psychiatry. Currently on cymbalta , citalopram  and seroquel . Saw rheumatology 10/27/23 - hip and leg pain.  Left hip xray 04/2023 - moderate left hip OA - recommended starting flexeril and celebrex . Referred to ortho.  Saw Whitney Meeler 11/04/23 - acute on chronic left groin and lateral hip pain. Placed on prednisone taper and referred to ortho. Saw ortho 11/17/23 - s/p hip joint injection.    Past Medical History:  Diagnosis Date   Depression    H/O dizziness    H/O eating disorder    H/O: alcohol abuse    Herpes    H/O   History of chicken pox    History of colon polyps    Hyperlipidemia    Hypertension    Kidney stones    H/O stones   Past Surgical History:  Procedure Laterality Date   ABDOMINAL HYSTERECTOMY  1998   APPENDECTOMY  1997   DILATION AND CURETTAGE OF UTERUS     TONSILLECTOMY  1997   Family History  Problem Relation Age of Onset   Hyperlipidemia Mother    Hypertension Mother    Breast cancer Mother 84   Arthritis Father    Hyperlipidemia Father    Hypertension Father    Diabetes Father    Hypertension  Brother    Arthritis Maternal Aunt    Hyperlipidemia Maternal Aunt    Hypertension Maternal Aunt    Alcohol abuse Maternal Uncle    Arthritis Maternal Uncle    Hyperlipidemia Maternal Uncle    Hypertension Maternal Uncle    Arthritis Paternal Aunt    Hyperlipidemia Paternal Aunt    Hypertension Paternal Aunt    Arthritis Paternal Uncle    Hyperlipidemia Paternal Uncle    Hypertension Paternal Uncle    Arthritis Maternal Grandmother    Hyperlipidemia Maternal Grandmother    Hypertension Maternal Grandmother    Arthritis Maternal Grandfather    Lung cancer Maternal Grandfather    Prostate cancer Maternal Grandfather    Hyperlipidemia Maternal Grandfather    Stroke Maternal Grandfather    Hypertension Maternal Grandfather    Arthritis Paternal Grandmother    Hyperlipidemia Paternal Grandmother    Hypertension Paternal Grandmother    Alcohol abuse Paternal Grandfather    Arthritis Paternal Grandfather    Hyperlipidemia Paternal Grandfather    Hypertension Paternal Grandfather    Sudden death Paternal Grandfather    Social History   Socioeconomic History   Marital status: Single    Spouse name: Not on file   Number of children: Not on file   Years of education: Not on file   Highest education level: Not  on file  Occupational History   Not on file  Tobacco Use   Smoking status: Never   Smokeless tobacco: Never  Vaping Use   Vaping status: Never Used  Substance and Sexual Activity   Alcohol use: No    Comment: sober since 10/27/2010   Drug use: Not Currently    Types: Crack cocaine   Sexual activity: Not Currently  Other Topics Concern   Not on file  Social History Narrative   Not on file   Social Drivers of Health   Financial Resource Strain: Medium Risk (11/04/2023)   Received from [facility]   Overall Financial Resource Strain (CARDIA)    Difficulty of Paying Living Expenses: Somewhat hard  Food Insecurity: No Food Insecurity  (11/04/2023)   Received from [facility]   Hunger Vital Sign    Worried About Running Out of Food in the Last Year: Never true    Ran Out of Food in the Last Year: Never true  Transportation Needs: No Transportation Needs (11/04/2023)   Received from [facility] - Transportation    In the past 12 months, has lack of transportation kept you from medical appointments or from getting medications?: No    Lack of Transportation (Non-Medical): No  Physical Activity: Inactive (09/25/2023)   Exercise Vital Sign    Days of Exercise per Week: 0 days    Minutes of Exercise per Session: 0 min  Stress: Stress Concern Present (09/25/2023)   [employer] of Occupational [employer] - Occupational Stress Questionnaire    Feeling of Stress : To some extent  Social Connections: Moderately Isolated (09/25/2023)   Social Connection and Isolation Panel [NHANES]    Frequency of Communication with Friends and Family: Once a week    Frequency of Social Gatherings with Friends and Family: Once a week    Attends Religious Services: More than 4 times per year    Active Member of [employer] or Organizations: Yes    Attends Engineer, Structural: More than 4 times per year    Marital Status: Divorced     Review of Systems     Objective:     There were no vitals taken for this visit. Wt Readings from Last 3 Encounters:  10/23/23 158 lb 12.8 oz (72 kg)  10/09/23 153 lb (69.4 kg)  09/10/23 155 lb (70.3 kg)    Physical Exam   Outpatient Encounter Medications as of 11/26/2023  Medication Sig   citalopram  (CELEXA ) 20 MG tablet Take 1 tablet (20 mg total) by mouth daily.   DULoxetine  (CYMBALTA ) 30 MG capsule Take 3 capsules (90 mg total) by mouth daily.   methocarbamol  (ROBAXIN ) 750 MG tablet Take 1 tablet (750 mg total) by mouth every 8 (eight) hours as needed for muscle spasms.   ondansetron  (ZOFRAN -ODT) 4 MG disintegrating tablet Take 1 tablet (4 mg total)  by mouth every 8 (eight) hours as needed for nausea or vomiting.   pantoprazole  (PROTONIX ) 40 MG tablet Take 1 tablet (40 mg total) by mouth daily.   QUEtiapine  (SEROQUEL ) 50 MG tablet TAKE 1 TABLET BY MOUTH EVERYDAY AT BEDTIME   No facility-administered encounter medications on file as of 11/26/2023.     Lab Results  Component Value Date   WBC 6.4 10/09/2023   HGB 14.5 10/09/2023   HCT 42.4 10/09/2023   PLT 293 10/09/2023   GLUCOSE 87 10/23/2023   CHOL 214 (H) 09/12/2023   TRIG 153 (H)  09/12/2023   HDL 55 09/12/2023   LDLDIRECT 132.0 05/13/2019   LDLCALC 128 (H) 09/12/2023   ALT 11 06/20/2023   AST 14 06/20/2023   NA 140 10/23/2023   K 4.2 10/23/2023   CL 106 10/23/2023   CREATININE 0.67 10/23/2023   BUN 16 10/23/2023   CO2 27 10/23/2023   TSH 2.288 04/08/2023   HGBA1C 5.5 09/11/2023    DG Chest 2 View Result Date: 10/09/2023 CLINICAL DATA:  Chest pain.  Pressure. EXAM: CHEST - 2 VIEW COMPARISON:  09/10/2023. FINDINGS: Bilateral lung fields are clear. Bilateral costophrenic angles are clear. Normal cardio-mediastinal silhouette. No acute osseous abnormalities. The soft tissues are within normal limits. IMPRESSION: *No active cardiopulmonary disease. Electronically Signed   By: Ree Molt M.D.   On: 10/09/2023 15:25       Assessment & Plan:  There are no diagnoses linked to this encounter.   Allena Hamilton, MD

## 2023-11-26 NOTE — Progress Notes (Signed)
 Patient ID: Tracy Phillips, female   DOB: 1966-08-07, 58 y.o.   MRN: [id number]   Virtual Visit via video Note  I connected with Tracy Phillips by a video enabled telemedicine application and verified that I am speaking with the correct person using two identifiers. Location patient: home Location provider: work  Persons participating in the virtual visit: patient, provider  The limitations, risks, security and privacy concerns of performing an evaluation and management service by video and the availability of in person appointments. It has also been discussed with the patient that there may be a patient responsible charge related to this service. The patient expressed understanding and agreed to proceed.  Interactive audio and video telecommunications were attempted between this provider and patient, however failed, due to patient having technical difficulties OR patient did not have access to video capability.  We continued and completed visit with audio only.   Reason for visit: follow up appt  HPI: Here for a scheduled follow up - to review -  She was reevaluated in ER 10/09/23 - with chest pain. Diagnosis felt to be consistent with nonspecific chest pain.  Patient with a reassuring cardiac workup.  No electrolyte abnormalities, troponin normal and flat x 2, no malignant arrhythmias on EKG, and chest x-ray without evidence of intrathoracic process, based on interpretation.  Patient stable throughout her course in the ED with improved chest pain symptomology following antianxiety medicine. Discharged with instructions to f/u here. Report that since her 08/2023 admission, she has not used cocaine and no other drugs. She reports she is no longer seeing psychiatry - due to missed appts. Is agreeable to f/u with psychiatry. Currently on cymbalta , citalopram  and seroquel . This was supposed to be an in office appt - needed f/u EKG given on above medication. Saw rheumatology 10/27/23 - hip and leg pain.   Left hip xray 04/2023 - moderate left hip OA - recommended starting flexeril and celebrex . Referred to ortho.  Saw Whitney Meeler 11/04/23 - acute on chronic left groin and lateral hip pain. Placed on prednisone taper and referred to ortho. Saw ortho 11/17/23 - s/p hip joint injection. She reports that she is having acute low back pain. Reports this is different from her previous pain and just started over the last couple of days. Denies any injury or trauma. Her back pain is why she changed her appt to a virtual appt. She recently saw ortho - s/p injection into her hip. Her hip is doing better. Now pain more localized to her back. Again discussed the need for psychiatry follow up.  I had agreed to temporarily feel her medication, until she could get established with a psychiatrist.    ROS: See pertinent positives and negatives per HPI.  Past Medical History:  Diagnosis Date   Depression    H/O dizziness    H/O eating disorder    H/O: alcohol abuse    Herpes    H/O   History of chicken pox    History of colon polyps    Hyperlipidemia    Hypertension    Kidney stones    H/O stones    Past Surgical History:  Procedure Laterality Date   ABDOMINAL HYSTERECTOMY  1998   APPENDECTOMY  1997   DILATION AND CURETTAGE OF UTERUS     TONSILLECTOMY  1997    Family History  Problem Relation Age of Onset   Hyperlipidemia Mother    Hypertension Mother    Breast cancer Mother 101   Arthritis  Father    Hyperlipidemia Father    Hypertension Father    Diabetes Father    Hypertension Brother    Arthritis Maternal Aunt    Hyperlipidemia Maternal Aunt    Hypertension Maternal Aunt    Alcohol abuse Maternal Uncle    Arthritis Maternal Uncle    Hyperlipidemia Maternal Uncle    Hypertension Maternal Uncle    Arthritis Paternal Aunt    Hyperlipidemia Paternal Aunt    Hypertension Paternal Aunt    Arthritis Paternal Uncle    Hyperlipidemia Paternal Uncle    Hypertension Paternal Uncle     Arthritis Maternal Grandmother    Hyperlipidemia Maternal Grandmother    Hypertension Maternal Grandmother    Arthritis Maternal Grandfather    Lung cancer Maternal Grandfather    Prostate cancer Maternal Grandfather    Hyperlipidemia Maternal Grandfather    Stroke Maternal Grandfather    Hypertension Maternal Grandfather    Arthritis Paternal Grandmother    Hyperlipidemia Paternal Grandmother    Hypertension Paternal Grandmother    Alcohol abuse Paternal Grandfather    Arthritis Paternal Grandfather    Hyperlipidemia Paternal Grandfather    Hypertension Paternal Grandfather    Sudden death Paternal Grandfather     SOCIAL HX: reviewed.    Current Outpatient Medications:    celecoxib  (CELEBREX ) 100 MG capsule, Take 100 mg by mouth 2 (two) times daily., Disp: , Rfl:    citalopram  (CELEXA ) 20 MG tablet, Take 1 tablet (20 mg total) by mouth daily., Disp: 30 tablet, Rfl: 2   cyclobenzaprine (FLEXERIL) 5 MG tablet, Take 5 mg by mouth at bedtime as needed., Disp: , Rfl:    DULoxetine  (CYMBALTA ) 30 MG capsule, Take 3 capsules (90 mg total) by mouth daily. (Patient taking differently: Take 60 mg by mouth daily.), Disp: 90 capsule, Rfl: 2   ondansetron  (ZOFRAN -ODT) 4 MG disintegrating tablet, Take 1 tablet (4 mg total) by mouth every 8 (eight) hours as needed for nausea or vomiting., Disp: 15 tablet, Rfl: 0   pantoprazole  (PROTONIX ) 40 MG tablet, Take 1 tablet (40 mg total) by mouth daily., Disp: 30 tablet, Rfl: 2   QUEtiapine  (SEROQUEL ) 50 MG tablet, TAKE 1 TABLET BY MOUTH EVERYDAY AT BEDTIME, Disp: 90 tablet, Rfl: 1  EXAM:  GENERAL: alert, oriented, sounds to be in no acute distress  PSYCH/NEURO: pleasant and cooperative, no obvious depression or anxiety, speech and thought processing grossly intact  ASSESSMENT AND PLAN:  Discussed the following assessment and plan:  Problem List Items Addressed This Visit     Alcohol dependence (HCC)   Not drinking currently.  Discussed AA and  counseling.  Discussed the need for f/u with psychiatry. Agreeable.       GERD (gastroesophageal reflux disease)   Protonix .       Hip pain   Saw ortho. S/p injection. Helped.       Hypercholesterolemia   Low cholesterol diet and exercise.  Follow lipid panel.       Hyperglycemia   Low carb diet and exercise.  Follow met b and a1c.       Low back pain   Reported low back pain as outlined. This pain started within the last couple of days. Discussed limitation of exam given telephone visit. Discussed the need for evaluation, especially given acute pain. Has seen ortho. Discussed ortho evaluation. Discussed that this would be to determine etiology of acute pain, not for chronic pain management. She plans to call ortho for evaluation.  Relevant Medications   celecoxib  (CELEBREX ) 100 MG capsule   cyclobenzaprine (FLEXERIL) 5 MG tablet   Major depressive disorder, recurrent episode, severe (HCC) - Primary   Currently on cymbalta  and citalopram . Seroquel  was restarted last visit. Her Rx had run out prior to last appt. She had been taking 100mg  dose previously and tolerating. EKG ok then.  Denies recent cocaine use or drug use - none since discharge in 08/2023.SABRA discussed counseling and need for f/u with psychiatry.  Reviewed old EKG - no QT prolongation.  She restarted seroquel  50mg  q hs last visit. She increased herself to 100mg . Discussed the need for f/u EKG. Will schedule.  Follow closely.  Order for psychiatry referral.  No SI.        Return in about 8 weeks (around 01/21/2024) for follow-up.  need nurse visit for EKG. .   I discussed the assessment and treatment plan with the patient. The patient was provided an opportunity to ask questions and all were answered. The patient agreed with the plan and demonstrated an understanding of the instructions.   The patient was advised to call back or seek an in-person evaluation if the symptoms worsen or if the condition fails to improve  as anticipated.  I provided 25 minutes of non-face-to-face time during this encounter.   Allena Hamilton, MD

## 2023-11-30 ENCOUNTER — Telehealth: Payer: Self-pay | Admitting: Internal Medicine

## 2023-11-30 ENCOUNTER — Encounter: Payer: Self-pay | Admitting: Internal Medicine

## 2023-11-30 DIAGNOSIS — M545 Low back pain, unspecified: Secondary | ICD-10-CM | POA: Insufficient documentation

## 2023-11-30 NOTE — Assessment & Plan Note (Signed)
 Low cholesterol diet and exercise.  Follow lipid panel.

## 2023-11-30 NOTE — Assessment & Plan Note (Signed)
 Protonix.  ?

## 2023-11-30 NOTE — Assessment & Plan Note (Signed)
 Reported low back pain as outlined. This pain started within the last couple of days. Discussed limitation of exam given telephone visit. Discussed the need for evaluation, especially given acute pain. Has seen ortho. Discussed ortho evaluation. Discussed that this would be to determine etiology of acute pain, not for chronic pain management. She plans to call ortho for evaluation.

## 2023-11-30 NOTE — Telephone Encounter (Signed)
 Needs nurse visit - for EKG only within the next week. Also needs an 8 week f/u appt. Thanks.

## 2023-11-30 NOTE — Assessment & Plan Note (Signed)
 Low carb diet and exercise.  Follow met b and a1c.

## 2023-11-30 NOTE — Assessment & Plan Note (Signed)
 Currently on cymbalta  and citalopram . Seroquel  was restarted last visit. Her Rx had run out prior to last appt. She had been taking 100mg  dose previously and tolerating. EKG ok then.  Denies recent cocaine use or drug use - none since discharge in 08/2023.SABRA discussed counseling and need for f/u with psychiatry.  Reviewed old EKG - no QT prolongation.  She restarted seroquel  50mg  q hs last visit. She increased herself to 100mg . Discussed the need for f/u EKG. Will schedule.  Follow closely.  Order for psychiatry referral.  No SI.

## 2023-11-30 NOTE — Assessment & Plan Note (Signed)
 Not drinking currently.  Discussed AA and counseling.  Discussed the need for f/u with psychiatry. Agreeable.

## 2023-11-30 NOTE — Assessment & Plan Note (Signed)
 Saw ortho. S/p injection. Helped.

## 2023-12-01 ENCOUNTER — Telehealth: Payer: Self-pay | Admitting: Internal Medicine

## 2023-12-01 DIAGNOSIS — W010XXA Fall on same level from slipping, tripping and stumbling without subsequent striking against object, initial encounter: Secondary | ICD-10-CM | POA: Diagnosis not present

## 2023-12-01 DIAGNOSIS — M545 Low back pain, unspecified: Secondary | ICD-10-CM | POA: Diagnosis not present

## 2023-12-01 NOTE — Telephone Encounter (Signed)
 Left message  that a 30 minute nurse and a 8 week follow up was scheduled. Patient ask to check her MyChart and call if she can not come to any of these appointments.

## 2023-12-02 DIAGNOSIS — M5416 Radiculopathy, lumbar region: Secondary | ICD-10-CM | POA: Diagnosis not present

## 2023-12-02 DIAGNOSIS — M1612 Unilateral primary osteoarthritis, left hip: Secondary | ICD-10-CM | POA: Diagnosis not present

## 2023-12-02 DIAGNOSIS — M47816 Spondylosis without myelopathy or radiculopathy, lumbar region: Secondary | ICD-10-CM | POA: Diagnosis not present

## 2023-12-02 DIAGNOSIS — M545 Low back pain, unspecified: Secondary | ICD-10-CM | POA: Diagnosis not present

## 2023-12-02 DIAGNOSIS — M7918 Myalgia, other site: Secondary | ICD-10-CM | POA: Diagnosis not present

## 2023-12-05 ENCOUNTER — Ambulatory Visit: Payer: BC Managed Care – PPO

## 2023-12-05 ENCOUNTER — Other Ambulatory Visit: Payer: Self-pay | Admitting: Internal Medicine

## 2023-12-05 ENCOUNTER — Other Ambulatory Visit: Payer: Self-pay | Admitting: Family Medicine

## 2023-12-05 DIAGNOSIS — M5416 Radiculopathy, lumbar region: Secondary | ICD-10-CM

## 2023-12-09 ENCOUNTER — Ambulatory Visit: Payer: BC Managed Care – PPO

## 2023-12-11 ENCOUNTER — Other Ambulatory Visit: Payer: Self-pay

## 2023-12-11 ENCOUNTER — Emergency Department
Admission: EM | Admit: 2023-12-11 | Discharge: 2023-12-11 | Disposition: A | Discharge status: Home / Self Care | Payer: BC Managed Care – PPO | Attending: Emergency Medicine | Admitting: Emergency Medicine

## 2023-12-11 ENCOUNTER — Encounter: Payer: Self-pay | Admitting: Internal Medicine

## 2023-12-11 ENCOUNTER — Ambulatory Visit (INDEPENDENT_AMBULATORY_CARE_PROVIDER_SITE_OTHER): Payer: BC Managed Care – PPO

## 2023-12-11 ENCOUNTER — Ambulatory Visit (INDEPENDENT_AMBULATORY_CARE_PROVIDER_SITE_OTHER): Payer: BC Managed Care – PPO | Admitting: Internal Medicine

## 2023-12-11 VITALS — BP 126/74 | HR 72 | Temp 98.1°F | Ht 63.0 in

## 2023-12-11 DIAGNOSIS — R45851 Suicidal ideations: Secondary | ICD-10-CM | POA: Diagnosis not present

## 2023-12-11 DIAGNOSIS — R739 Hyperglycemia, unspecified: Secondary | ICD-10-CM | POA: Diagnosis not present

## 2023-12-11 DIAGNOSIS — F32A Depression, unspecified: Secondary | ICD-10-CM | POA: Insufficient documentation

## 2023-12-11 DIAGNOSIS — E7800 Pure hypercholesterolemia, unspecified: Secondary | ICD-10-CM

## 2023-12-11 DIAGNOSIS — F1029 Alcohol dependence with unspecified alcohol-induced disorder: Secondary | ICD-10-CM

## 2023-12-11 DIAGNOSIS — F332 Major depressive disorder, recurrent severe without psychotic features: Secondary | ICD-10-CM | POA: Diagnosis not present

## 2023-12-11 DIAGNOSIS — Z79899 Other long term (current) drug therapy: Secondary | ICD-10-CM

## 2023-12-11 DIAGNOSIS — F1490 Cocaine use, unspecified, uncomplicated: Secondary | ICD-10-CM

## 2023-12-11 LAB — COMPREHENSIVE METABOLIC PANEL
ALT: 17 U/L (ref 0–44)
AST: 18 U/L (ref 15–41)
Albumin: 4.4 g/dL (ref 3.5–5.0)
Alkaline Phosphatase: 114 U/L (ref 38–126)
Anion gap: 14 (ref 5–15)
BUN: 20 mg/dL (ref 6–20)
CO2: 21 mmol/L — ABNORMAL LOW (ref 22–32)
Calcium: 9.3 mg/dL (ref 8.9–10.3)
Chloride: 104 mmol/L (ref 98–111)
Creatinine, Ser: 0.62 mg/dL (ref 0.44–1.00)
GFR, Estimated: >60 mL/min (ref >60)
Glucose, Bld: 87 mg/dL (ref 70–99)
Potassium: 4 mmol/L (ref 3.5–5.1)
Sodium: 139 mmol/L (ref 135–145)
Total Bilirubin: 1 mg/dL (ref 0.0–1.2)
Total Protein: 7.6 g/dL (ref 6.5–8.1)

## 2023-12-11 LAB — CBC
HCT: 42.2 % (ref 36.0–46.0)
Hemoglobin: 13.8 g/dL (ref 12.0–15.0)
MCH: 29.7 pg (ref 26.0–34.0)
MCHC: 32.7 g/dL (ref 30.0–36.0)
MCV: 90.8 fL (ref 80.0–100.0)
Platelets: 279 10*3/uL (ref 150–400)
RBC: 4.65 MIL/uL (ref 3.87–5.11)
RDW: 15.1 % (ref 11.5–15.5)
WBC: 6.5 10*3/uL (ref 4.0–10.5)
nRBC: 0 % (ref 0.0–0.2)

## 2023-12-11 LAB — ETHANOL: Alcohol, Ethyl (B): <10 mg/dL (ref <10)

## 2023-12-11 LAB — ACETAMINOPHEN LEVEL: Acetaminophen (Tylenol), Serum: <10 ug/mL — ABNORMAL LOW (ref 10–30)

## 2023-12-11 MED ORDER — HYDROXYZINE HCL 25 MG PO TABS
25.0000 mg | ORAL_TABLET | Freq: Once | ORAL | Status: AC
  Administered 2023-12-11: 25 mg via ORAL
  Filled 2023-12-11: qty 1

## 2023-12-11 NOTE — ED Notes (Signed)
Lab called to collect blood as two Rns attmepted. Lab said they would send someone.

## 2023-12-11 NOTE — ED Notes (Signed)
Pt changed into blue and wine colored scrubs by this tech and Jess, Charity fundraiser. Pt cooperative but tearful, stating she feels anxious and like she might have a panic attack. Pt reassured by this tech and Sharlynn Oliphant, Charity fundraiser.

## 2023-12-11 NOTE — Progress Notes (Signed)
Pt presented today for an EKG for monitoring of medication use.    When I got pt back to the room she stated that she had a back injection 1/14/2025and it was affecting her mood and that she had waiting for this appt to discuss it. I advised the pt that this is not a doctor's visit that she is just here to have the EKG done. I went on to ask pt if she is suicidial and she stated that she does have thought. Pt does not have a plan and no thoughts of hurting herself right now. I went and spoke with Dr. Lorin Picket about this matter. Dr. Lorin Picket advised that she will need to be seen so we added pt to Dr. Roby Lofts schedule.

## 2023-12-11 NOTE — ED Triage Notes (Signed)
Pt family member brings Pt from Drs office Pt states that she has spine and back issues States she's been getting Prednisone injuries and has been getting tapered off She states since her injury she has gained weight and within a month has gained 20lbs  Pt states she has panic attack anxiety  She takes Celexa, Cymbalta and Seroquel at home  She has had no relief of anxiety depression No SI/HI, but states she thinks of her family and what they would think if she was no longer here.

## 2023-12-11 NOTE — ED Notes (Signed)
Pt called this RN to see her.  RN entered room and she stated that she was anxious, she tried the medication, but the pt nearby cont to yell and per pt, "It's not really helping my anxiety."  Pt appears reasonable about the above.  Dr. Larinda Buttery was informed and he is currently speaking to the pt. Nurse oncoming to nights was made aware of the above too

## 2023-12-11 NOTE — ED Notes (Signed)
VOL/  PENDING  CONSULT 

## 2023-12-11 NOTE — ED Provider Notes (Signed)
[facility] Provider Note    Event Date/Time   First MD Initiated Contact with Patient 12/11/23 1731     (approximate)   History   Chief Complaint Mental Health Problem   HPI  Tracy Phillips is a 58 y.o. female with past medical history of alcohol abuse, anxiety, and depression who presents to the ED for psychiatric evaluation.  Patient reports that she has been receiving steroid injections for her back recently, has felt like her mood has changed significantly during this time.  She describes feeling hopeless and depressed, denies any suicidal ideation but does state that she has thought about what it would be like if she were not around anymore.  She denies any plans to harm herself and has not had any homicidal ideation.  She states she is 13 years sober, denies any recent drug use.  She has been taking all medications as prescribed.  She describes some of these feelings to her PCP earlier today, who recommended she come to the ED for further evaluation.     Physical Exam   Triage Vital Signs: ED Triage Vitals  Encounter Vitals Group     BP 12/11/23 1723 (!) 164/84     Systolic BP Percentile --      Diastolic BP Percentile --      Pulse Rate 12/11/23 1723 80     Resp 12/11/23 1723 18     Temp 12/11/23 1723 98.3 F (36.8 C)     Temp Source 12/11/23 1723 Oral     SpO2 12/11/23 1723 97 %     Weight --      Height --      Head Circumference --      Peak Flow --      Pain Score 12/11/23 1726 0     Pain Loc --      Pain Education --      Exclude from Growth Chart --     Most recent vital signs: Vitals:   12/11/23 1723  BP: (!) 164/84  Pulse: 80  Resp: 18  Temp: 98.3 F (36.8 C)  SpO2: 97%    Constitutional: Alert and oriented. Eyes: Conjunctivae are normal. Head: Atraumatic. Nose: No congestion/rhinnorhea. Mouth/Throat: Mucous membranes are moist.  Cardiovascular: Normal rate, regular rhythm. Grossly normal heart sounds.  2+ radial  pulses bilaterally. Respiratory: Normal respiratory effort.  No retractions. Lungs CTAB. Gastrointestinal: Soft and nontender. No distention. Musculoskeletal: No lower extremity tenderness nor edema.  Neurologic:  Normal speech and language. No gross focal neurologic deficits are appreciated.    ED Results / Procedures / Treatments   Labs (all labs ordered are listed, but only abnormal results are displayed) Labs Reviewed  COMPREHENSIVE METABOLIC PANEL - Abnormal; Notable for the following components:      Result Value   CO2 21 (*)    All other components within normal limits  CBC  ACETAMINOPHEN LEVEL  PREGNANCY, URINE  URINE DRUG SCREEN, QUALITATIVE ([facility] ONLY)  ETHANOL    PROCEDURES:  Critical Care performed: No  Procedures   MEDICATIONS ORDERED IN ED: Medications  hydrOXYzine (ATARAX) tablet 25 mg (25 mg Oral Given 12/11/23 1818)     IMPRESSION / MDM / ASSESSMENT AND PLAN / ED COURSE  I reviewed the triage vital signs and the nursing notes.                              57  y.o. female with past medical history of alcohol abuse, anxiety, and depression who presents to the ED with feelings of depression, anxiety, and hopelessness for about the past week.  Patient's presentation is most consistent with acute presentation with potential threat to life or bodily function.  Differential diagnosis includes, but is not limited to, depression, anxiety, psychosis, suicidal ideation, homicidal ideation, anemia, electrolyte abnormality, AKI.  Patient nontoxic-appearing and in no acute distress, vital signs are unremarkable.  She denies any medical complaints, screening labs are pending at this time.  She is calm and cooperative, denies any active SI and we will maintain voluntary status.  She is agreeable to stay for psychiatric evaluation, requesting some medication to help with anxiety and we will give dose of Atarax.  Patient requesting to be discharged home, states her  anxiety is worse being here than it would be at home.  She continues to deny any suicidal ideation, states that she plans to follow-up with her PCP, who is also referred her to psychiatry.  Additional resources provided and patient counseled to return to the ED for new or worsening symptoms.  Patient agrees with plan.      FINAL CLINICAL IMPRESSION(S) / ED DIAGNOSES   Final diagnoses:  Depression, unspecified depression type     Rx / DC Orders   ED Discharge Orders     None        Note:  This document was prepared using Dragon voice recognition software and may include unintentional dictation errors.   Chesley Noon, MD 12/11/23 [phone]

## 2023-12-12 ENCOUNTER — Other Ambulatory Visit: Payer: Self-pay | Admitting: Internal Medicine

## 2023-12-12 ENCOUNTER — Other Ambulatory Visit: Payer: Self-pay

## 2023-12-12 MED ORDER — QUETIAPINE FUMARATE 100 MG PO TABS: 100.0000 mg | ORAL_TABLET | Freq: Every day | ORAL | 0 refills | Status: DC

## 2023-12-12 NOTE — Addendum Note (Signed)
Addended by: Charm Barges on: 12/12/2023 04:59 PM   Modules accepted: Orders

## 2023-12-12 NOTE — Telephone Encounter (Signed)
Already sent in rx for seroquel 100mg  - see rx request. PDMP reviewed.

## 2023-12-12 NOTE — Telephone Encounter (Signed)
Pt called and made aware that new prescription for 100mg  tablets was sent into pharmacy.

## 2023-12-12 NOTE — Telephone Encounter (Signed)
Copied from CRM [id number]. Topic: Clinical - Medication Question >> Dec 12, 2023  2:52 PM Suzette B wrote: Reason for CRM: Patient has called in requesting status of a refill for her medication which was called in today QUEtiapin! I did advise the patient it can take up to 3 business days for the medication refill, but to check with her pharmacy. Patient states she really needs her medication because she is totally out. Please call to advise of any updates [phone]

## 2023-12-12 NOTE — Telephone Encounter (Signed)
Copied from CRM [id number]. Topic: Clinical - Medication Refill >> Dec 12, 2023 11:22 AM Lennart Pall wrote: Most Recent Primary Care Visit:  Provider: Dale [place]  Department: [facility]  Visit Type: Earleen Reaper VIDEO VISIT  Date: 11/26/2023  Medication: QUEtiapine  Has the patient contacted their pharmacy? Yes (Agent: If no, request that the patient contact the pharmacy for the refill. If patient does not wish to contact the pharmacy document the reason why and proceed with request.) (Agent: If yes, when and what did the pharmacy advise?)  Is this the correct pharmacy for this prescription? Yes If no, delete pharmacy and type the correct one.  This is the patient's preferred pharmacy:  [id number] [place], [place] - [address] [place] [address] Phone: [phone] Fax: [phone]   Has the prescription been filled recently? Yes  Is the patient out of the medication? Yes  Has the patient been seen for an appointment in the last year OR does the patient have an upcoming appointment? Yes  Can we respond through MyChart? Yes  Agent: Please be advised that Rx refills may take up to 3 business days. We ask that you follow-up with your pharmacy.

## 2023-12-12 NOTE — Telephone Encounter (Signed)
Rx sent in for seroquel 100mg  - take one tablet q hs.  Please notify pt of dose change - she was taking 2(50mg ) tablets q hs and will just take one now.

## 2023-12-12 NOTE — Telephone Encounter (Signed)
Phone call to pt.  Per medication list in EPIC order is Quetiapine 50mg  take 1 tablet at bedtime. Pt reports that she is out of medication because it was increased to 2 tablets at bedtime. See note from Dr. Lorin Picket on 11/30/23 that pt had increased to 100mg  her self. She was without medication last night.  New prescription pended for Dr. Roby Lofts approval.

## 2023-12-12 NOTE — Addendum Note (Signed)
Addended by: Benedict Needy on: 12/12/2023 03:55 PM   Modules accepted: Orders

## 2023-12-14 ENCOUNTER — Encounter: Payer: Self-pay | Admitting: Internal Medicine

## 2023-12-14 NOTE — Progress Notes (Signed)
Subjective:    Patient ID: Tracy Phillips, female    DOB: 10-16-1966, 58 y.o.   MRN: [id number]  Patient here for  Chief Complaint  Patient presents with   Medication Problem    HPI Came in for nurse visit to have EKG - to confirm QTc stable - given current medications. During nurse visit, she reported not feeling well. Increased stress, anxiety depression. Also expressed she has had some suicidal thoughts. Reports felt things were relatively stable regarding her mental state until after her back injection. States her back is some better. Now feels her thoughts are more focused on her stress and depression. Mood is down. Some hot flashes. Denies cocaine use. States has not had any cocaine since 08/2023. Denies alcohol intake. Does not want to get back to that point. Has had some suicidal thoughts. Does not have a definite plan, but has thought about how killing herself would affect her mother and other people. Discussed further evaluation. She is taking her medication regularly. Is currently taking seroquel 100mg  q hs. Discussed evaluation in ER. She is agreeable and interested in further treatment.    Past Medical History:  Diagnosis Date   Depression    H/O dizziness    H/O eating disorder    H/O: alcohol abuse    Herpes    H/O   History of chicken pox    History of colon polyps    Hyperlipidemia    Hypertension    Kidney stones    H/O stones   Past Surgical History:  Procedure Laterality Date   ABDOMINAL HYSTERECTOMY  1998   APPENDECTOMY  1997   DILATION AND CURETTAGE OF UTERUS     TONSILLECTOMY  1997   Family History  Problem Relation Age of Onset   Hyperlipidemia Mother    Hypertension Mother    Breast cancer Mother 64   Arthritis Father    Hyperlipidemia Father    Hypertension Father    Diabetes Father    Hypertension Brother    Arthritis Maternal Aunt    Hyperlipidemia Maternal Aunt    Hypertension Maternal Aunt    Alcohol abuse Maternal Uncle    Arthritis  Maternal Uncle    Hyperlipidemia Maternal Uncle    Hypertension Maternal Uncle    Arthritis Paternal Aunt    Hyperlipidemia Paternal Aunt    Hypertension Paternal Aunt    Arthritis Paternal Uncle    Hyperlipidemia Paternal Uncle    Hypertension Paternal Uncle    Arthritis Maternal Grandmother    Hyperlipidemia Maternal Grandmother    Hypertension Maternal Grandmother    Arthritis Maternal Grandfather    Lung cancer Maternal Grandfather    Prostate cancer Maternal Grandfather    Hyperlipidemia Maternal Grandfather    Stroke Maternal Grandfather    Hypertension Maternal Grandfather    Arthritis Paternal Grandmother    Hyperlipidemia Paternal Grandmother    Hypertension Paternal Grandmother    Alcohol abuse Paternal Grandfather    Arthritis Paternal Grandfather    Hyperlipidemia Paternal Grandfather    Hypertension Paternal Grandfather    Sudden death Paternal Grandfather    Social History   Socioeconomic History   Marital status: Single    Spouse name: Not on file   Number of children: Not on file   Years of education: Not on file   Highest education level: Not on file  Occupational History   Not on file  Tobacco Use   Smoking status: Never   Smokeless tobacco: Never  Vaping  Use   Vaping status: Never Used  Substance and Sexual Activity   Alcohol use: No    Comment: sober since 10/27/2010   Drug use: Not Currently    Types: "Crack" cocaine   Sexual activity: Not Currently  Other Topics Concern   Not on file  Social History Narrative   Not on file   Social Drivers of Health   Financial Resource Strain: Medium Risk (11/04/2023)   Received from [facility]   Overall Financial Resource Strain (CARDIA)    Difficulty of Paying Living Expenses: Somewhat hard  Food Insecurity: No Food Insecurity (11/04/2023)   Received from [facility]   Hunger Vital Sign    Worried About Running Out of Food in the Last Year: Never true    Ran  Out of Food in the Last Year: Never true  Transportation Needs: No Transportation Needs (11/04/2023)   Received from [facility] Transportation    In the past 12 months, has lack of transportation kept you from medical appointments or from getting medications?: No    Lack of Transportation (Non-Medical): No  Physical Activity: Inactive (09/25/2023)   Exercise Vital Sign    Days of Exercise per Week: 0 days    Minutes of Exercise per Session: 0 min  Stress: Stress Concern Present (09/25/2023)   [employer] of Occupational Health - Occupational Stress Questionnaire    Feeling of Stress : To some extent  Social Connections: Moderately Isolated (09/25/2023)   Social Connection and Isolation Panel [NHANES]    Frequency of Communication with Friends and Family: Once a week    Frequency of Social Gatherings with Friends and Family: Once a week    Attends Religious Services: More than 4 times per year    Active Member of [employer] or Organizations: Yes    Attends Engineer, structural: More than 4 times per year    Marital Status: Divorced     Review of Systems  Constitutional:  Negative for appetite change and unexpected weight change.  HENT:  Negative for congestion and sinus pressure.   Respiratory:  Negative for cough, chest tightness and shortness of breath.   Cardiovascular:  Negative for chest pain, palpitations and leg swelling.  Gastrointestinal:  Negative for abdominal pain, diarrhea, nausea and vomiting.  Genitourinary:  Negative for difficulty urinating and dysuria.  Musculoskeletal:  Negative for joint swelling and myalgias.  Skin:  Negative for color change and rash.  Neurological:  Negative for dizziness and headaches.  Psychiatric/Behavioral:         Increased stress. Depression.  Has had suicidal ideations.        Objective:     BP 126/74   Pulse 72   Temp 98.1 F (36.7 C) (Oral)   Ht 5\' 3"  (1.6 m)   SpO2 97%   BMI 28.13  kg/m  Wt Readings from Last 3 Encounters:  10/23/23 158 lb 12.8 oz (72 kg)  10/09/23 153 lb (69.4 kg)  09/10/23 155 lb (70.3 kg)    Physical Exam Constitutional:      General: She is not in acute distress.    Appearance: Normal appearance.  HENT:     Head: Atraumatic.  Cardiovascular:     Rate and Rhythm: Normal rate.  Pulmonary:     Effort: No respiratory distress.  Musculoskeletal:        General: No swelling.  Skin:    Findings: No erythema or rash.  Neurological:     General: No focal deficit present.     Mental Status: She is alert and oriented to person, place, and time.  Psychiatric:     Comments: Increased stress/depression.          Outpatient Encounter Medications as of 12/11/2023  Medication Sig   celecoxib (CELEBREX) 100 MG capsule Take 100 mg by mouth 2 (two) times daily.   citalopram (CELEXA) 20 MG tablet Take 1 tablet (20 mg total) by mouth daily.   cyclobenzaprine (FLEXERIL) 5 MG tablet Take 5 mg by mouth at bedtime as needed.   DULoxetine (CYMBALTA) 30 MG capsule Take 3 capsules (90 mg total) by mouth daily. (Patient taking differently: Take 60 mg by mouth daily.)   HYDROcodone-acetaminophen (NORCO/VICODIN) 5-325 MG tablet Take by mouth.   ondansetron (ZOFRAN-ODT) 4 MG disintegrating tablet Take 1 tablet (4 mg total) by mouth every 8 (eight) hours as needed for nausea or vomiting.   pantoprazole (PROTONIX) 40 MG tablet Take 1 tablet (40 mg total) by mouth daily.   QUEtiapine (SEROQUEL) 50 MG tablet TAKE 1 TABLET BY MOUTH EVERYDAY AT BEDTIME   No facility-administered encounter medications on file as of 12/11/2023.     Lab Results  Component Value Date   WBC 6.5 12/11/2023   HGB 13.8 12/11/2023   HCT 42.2 12/11/2023   PLT 279 12/11/2023   GLUCOSE 87 12/11/2023   CHOL 214 (H) 09/12/2023   TRIG 153 (H) 09/12/2023   HDL 55 09/12/2023   LDLDIRECT 132.0 05/13/2019   LDLCALC 128 (H) 09/12/2023   ALT 17 12/11/2023   AST 18 12/11/2023   NA 139  12/11/2023   K 4.0 12/11/2023   CL 104 12/11/2023   CREATININE 0.62 12/11/2023   BUN 20 12/11/2023   CO2 21 (L) 12/11/2023   TSH 2.288 04/08/2023   HGBA1C 5.5 09/11/2023       Assessment & Plan:  Suicidal ideation Assessment & Plan: Reports having some suicidal thoughts as outlined. Discussed with her today. Discussed further treatment and evaluation. Given increased symptoms, discussed ER evaluation. Discussed with behavioral health intake provider. Discussed with ER staff. Her mother came and transported her over to ER for evaluation and treatment.    MDD (major depressive disorder), recurrent severe, without psychosis (HCC) Assessment & Plan: Taking cymbalta, citalopram and seroquel regularly. EKG - QTc stable. Increased symptoms as outlined. Suicidal thoughts as outlined.  Discussed need for further psychiatric evaluation and treatment.  Agreeable. ER notified. Discussed with behavioral intake provider.  Mother transported her to ER for further evaluation.    Hyperglycemia Assessment & Plan: Follow met b and a1c.    Hypercholesterolemia Assessment & Plan: Follow lipid panel.    Cocaine use Assessment & Plan: Denies any cocaine use since 08/2023.    Alcohol dependence with unspecified alcohol-induced disorder [facility]) Assessment & Plan: Denies alcohol intake - none since 08/2023.       Dale McFarland, MD

## 2023-12-14 NOTE — Assessment & Plan Note (Signed)
Follow lipid panel.

## 2023-12-14 NOTE — Assessment & Plan Note (Signed)
Reports having some suicidal thoughts as outlined. Discussed with her today. Discussed further treatment and evaluation. Given increased symptoms, discussed ER evaluation. Discussed with behavioral health intake provider. Discussed with ER staff. Her mother came and transported her over to ER for evaluation and treatment.

## 2023-12-14 NOTE — Assessment & Plan Note (Signed)
Denies any cocaine use since 08/2023.

## 2023-12-14 NOTE — Assessment & Plan Note (Signed)
Denies alcohol intake - none since 08/2023.

## 2023-12-14 NOTE — Assessment & Plan Note (Signed)
Taking cymbalta, citalopram and seroquel regularly. EKG - QTc stable. Increased symptoms as outlined. Suicidal thoughts as outlined.  Discussed need for further psychiatric evaluation and treatment.  Agreeable. ER notified. Discussed with behavioral intake provider.  Mother transported her to ER for further evaluation.

## 2023-12-14 NOTE — Assessment & Plan Note (Signed)
Follow met b and a1c.

## 2023-12-16 ENCOUNTER — Ambulatory Visit
Admission: RE | Admit: 2023-12-16 | Discharge: 2023-12-16 | Disposition: A | Discharge status: Home / Self Care | Payer: BC Managed Care – PPO | Source: Ambulatory Visit | Attending: Family Medicine | Admitting: Family Medicine

## 2023-12-16 DIAGNOSIS — M5416 Radiculopathy, lumbar region: Secondary | ICD-10-CM

## 2023-12-16 DIAGNOSIS — M48061 Spinal stenosis, lumbar region without neurogenic claudication: Secondary | ICD-10-CM | POA: Diagnosis not present

## 2023-12-30 ENCOUNTER — Encounter: Payer: Self-pay | Admitting: Family Medicine

## 2023-12-30 ENCOUNTER — Other Ambulatory Visit: Payer: Self-pay | Admitting: Family Medicine

## 2023-12-30 DIAGNOSIS — S322XXA Fracture of coccyx, initial encounter for closed fracture: Secondary | ICD-10-CM

## 2023-12-31 ENCOUNTER — Encounter: Payer: Self-pay | Admitting: Emergency Medicine

## 2023-12-31 ENCOUNTER — Inpatient Hospital Stay
Admission: EM | Admit: 2023-12-31 | Discharge: 2024-01-05 | DRG: 897 | Disposition: A | Discharge status: Home / Self Care | Payer: BC Managed Care – PPO | Attending: Internal Medicine | Admitting: Internal Medicine

## 2023-12-31 DIAGNOSIS — F10929 Alcohol use, unspecified with intoxication, unspecified: Secondary | ICD-10-CM | POA: Diagnosis not present

## 2023-12-31 DIAGNOSIS — F10920 Alcohol use, unspecified with intoxication, uncomplicated: Principal | ICD-10-CM

## 2023-12-31 DIAGNOSIS — Z8659 Personal history of other mental and behavioral disorders: Secondary | ICD-10-CM | POA: Diagnosis not present

## 2023-12-31 DIAGNOSIS — Z9889 Other specified postprocedural states: Secondary | ICD-10-CM

## 2023-12-31 DIAGNOSIS — Z9089 Acquired absence of other organs: Secondary | ICD-10-CM

## 2023-12-31 DIAGNOSIS — Z791 Long term (current) use of non-steroidal anti-inflammatories (NSAID): Secondary | ICD-10-CM

## 2023-12-31 DIAGNOSIS — I10 Essential (primary) hypertension: Secondary | ICD-10-CM | POA: Diagnosis present

## 2023-12-31 DIAGNOSIS — Z833 Family history of diabetes mellitus: Secondary | ICD-10-CM | POA: Diagnosis not present

## 2023-12-31 DIAGNOSIS — Z79899 Other long term (current) drug therapy: Secondary | ICD-10-CM | POA: Diagnosis not present

## 2023-12-31 DIAGNOSIS — Z803 Family history of malignant neoplasm of breast: Secondary | ICD-10-CM

## 2023-12-31 DIAGNOSIS — R110 Nausea: Secondary | ICD-10-CM | POA: Diagnosis not present

## 2023-12-31 DIAGNOSIS — R112 Nausea with vomiting, unspecified: Secondary | ICD-10-CM | POA: Diagnosis present

## 2023-12-31 DIAGNOSIS — Z9049 Acquired absence of other specified parts of digestive tract: Secondary | ICD-10-CM

## 2023-12-31 DIAGNOSIS — F10231 Alcohol dependence with withdrawal delirium: Secondary | ICD-10-CM | POA: Diagnosis not present

## 2023-12-31 DIAGNOSIS — F10229 Alcohol dependence with intoxication, unspecified: Secondary | ICD-10-CM | POA: Diagnosis not present

## 2023-12-31 DIAGNOSIS — Y907 Blood alcohol level of 200-239 mg/100 ml: Secondary | ICD-10-CM | POA: Diagnosis present

## 2023-12-31 DIAGNOSIS — Z83438 Family history of other disorder of lipoprotein metabolism and other lipidemia: Secondary | ICD-10-CM

## 2023-12-31 DIAGNOSIS — R569 Unspecified convulsions: Secondary | ICD-10-CM | POA: Diagnosis present

## 2023-12-31 DIAGNOSIS — E785 Hyperlipidemia, unspecified: Secondary | ICD-10-CM | POA: Diagnosis not present

## 2023-12-31 DIAGNOSIS — Z87442 Personal history of urinary calculi: Secondary | ICD-10-CM | POA: Diagnosis not present

## 2023-12-31 DIAGNOSIS — R45851 Suicidal ideations: Secondary | ICD-10-CM | POA: Diagnosis present

## 2023-12-31 DIAGNOSIS — G44001 Cluster headache syndrome, unspecified, intractable: Secondary | ICD-10-CM | POA: Diagnosis not present

## 2023-12-31 DIAGNOSIS — Z860100 Personal history of colon polyps, unspecified: Secondary | ICD-10-CM

## 2023-12-31 DIAGNOSIS — R1111 Vomiting without nausea: Secondary | ICD-10-CM | POA: Diagnosis not present

## 2023-12-31 DIAGNOSIS — Z8261 Family history of arthritis: Secondary | ICD-10-CM

## 2023-12-31 DIAGNOSIS — Z90710 Acquired absence of both cervix and uterus: Secondary | ICD-10-CM

## 2023-12-31 DIAGNOSIS — F419 Anxiety disorder, unspecified: Secondary | ICD-10-CM | POA: Diagnosis present

## 2023-12-31 DIAGNOSIS — F1010 Alcohol abuse, uncomplicated: Secondary | ICD-10-CM | POA: Diagnosis present

## 2023-12-31 DIAGNOSIS — Z823 Family history of stroke: Secondary | ICD-10-CM

## 2023-12-31 DIAGNOSIS — Z801 Family history of malignant neoplasm of trachea, bronchus and lung: Secondary | ICD-10-CM

## 2023-12-31 DIAGNOSIS — Z8619 Personal history of other infectious and parasitic diseases: Secondary | ICD-10-CM

## 2023-12-31 DIAGNOSIS — E871 Hypo-osmolality and hyponatremia: Secondary | ICD-10-CM | POA: Diagnosis present

## 2023-12-31 DIAGNOSIS — R079 Chest pain, unspecified: Secondary | ICD-10-CM | POA: Diagnosis present

## 2023-12-31 DIAGNOSIS — F332 Major depressive disorder, recurrent severe without psychotic features: Secondary | ICD-10-CM | POA: Diagnosis not present

## 2023-12-31 DIAGNOSIS — F10939 Alcohol use, unspecified with withdrawal, unspecified: Secondary | ICD-10-CM | POA: Diagnosis present

## 2023-12-31 DIAGNOSIS — Z8249 Family history of ischemic heart disease and other diseases of the circulatory system: Secondary | ICD-10-CM | POA: Diagnosis not present

## 2023-12-31 DIAGNOSIS — F1490 Cocaine use, unspecified, uncomplicated: Secondary | ICD-10-CM | POA: Diagnosis not present

## 2023-12-31 DIAGNOSIS — F10239 Alcohol dependence with withdrawal, unspecified: Secondary | ICD-10-CM | POA: Diagnosis not present

## 2023-12-31 DIAGNOSIS — R519 Headache, unspecified: Secondary | ICD-10-CM | POA: Diagnosis not present

## 2023-12-31 DIAGNOSIS — Z8042 Family history of malignant neoplasm of prostate: Secondary | ICD-10-CM

## 2023-12-31 DIAGNOSIS — R0789 Other chest pain: Secondary | ICD-10-CM | POA: Diagnosis not present

## 2023-12-31 DIAGNOSIS — Z886 Allergy status to analgesic agent status: Secondary | ICD-10-CM

## 2023-12-31 DIAGNOSIS — G44209 Tension-type headache, unspecified, not intractable: Secondary | ICD-10-CM | POA: Diagnosis not present

## 2023-12-31 DIAGNOSIS — G44039 Episodic paroxysmal hemicrania, not intractable: Secondary | ICD-10-CM | POA: Diagnosis not present

## 2023-12-31 DIAGNOSIS — F10930 Alcohol use, unspecified with withdrawal, uncomplicated: Secondary | ICD-10-CM | POA: Diagnosis not present

## 2023-12-31 LAB — CBC
HCT: 44.4 % (ref 36.0–46.0)
Hemoglobin: 14.8 g/dL (ref 12.0–15.0)
MCH: 29.2 pg (ref 26.0–34.0)
MCHC: 33.3 g/dL (ref 30.0–36.0)
MCV: 87.7 fL (ref 80.0–100.0)
Platelets: 282 10*3/uL (ref 150–400)
RBC: 5.06 MIL/uL (ref 3.87–5.11)
RDW: 14.8 % (ref 11.5–15.5)
WBC: 8.1 10*3/uL (ref 4.0–10.5)
nRBC: 0 % (ref 0.0–0.2)

## 2023-12-31 LAB — ETHANOL: Alcohol, Ethyl (B): 225 mg/dL — ABNORMAL HIGH (ref <10)

## 2023-12-31 LAB — URINE DRUG SCREEN, QUALITATIVE (ARMC ONLY)
Amphetamines, Ur Screen: NOT DETECTED
Barbiturates, Ur Screen: NOT DETECTED
Benzodiazepine, Ur Scrn: NOT DETECTED
Cannabinoid 50 Ng, Ur ~~LOC~~: NOT DETECTED
Cocaine Metabolite,Ur ~~LOC~~: NOT DETECTED
MDMA (Ecstasy)Ur Screen: NOT DETECTED
Methadone Scn, Ur: NOT DETECTED
Opiate, Ur Screen: NOT DETECTED
Phencyclidine (PCP) Ur S: NOT DETECTED
Tricyclic, Ur Screen: POSITIVE — AB

## 2023-12-31 LAB — COMPREHENSIVE METABOLIC PANEL
ALT: 27 U/L (ref 0–44)
AST: 30 U/L (ref 15–41)
Albumin: 4.5 g/dL (ref 3.5–5.0)
Alkaline Phosphatase: 89 U/L (ref 38–126)
Anion gap: 17 — ABNORMAL HIGH (ref 5–15)
BUN: 17 mg/dL (ref 6–20)
CO2: 20 mmol/L — ABNORMAL LOW (ref 22–32)
Calcium: 8.3 mg/dL — ABNORMAL LOW (ref 8.9–10.3)
Chloride: 97 mmol/L — ABNORMAL LOW (ref 98–111)
Creatinine, Ser: 0.63 mg/dL (ref 0.44–1.00)
GFR, Estimated: >60 mL/min (ref >60)
Glucose, Bld: 132 mg/dL — ABNORMAL HIGH (ref 70–99)
Potassium: 3.8 mmol/L (ref 3.5–5.1)
Sodium: 134 mmol/L — ABNORMAL LOW (ref 135–145)
Total Bilirubin: 0.9 mg/dL (ref 0.0–1.2)
Total Protein: 7.6 g/dL (ref 6.5–8.1)

## 2023-12-31 LAB — ACETAMINOPHEN LEVEL: Acetaminophen (Tylenol), Serum: <10 ug/mL — ABNORMAL LOW (ref 10–30)

## 2023-12-31 LAB — SALICYLATE LEVEL: Salicylate Lvl: <7 mg/dL — ABNORMAL LOW (ref 7.0–30.0)

## 2023-12-31 LAB — TYPE AND SCREEN
ABO/RH(D): O NEG
Antibody Screen: NEGATIVE

## 2023-12-31 MED ORDER — ACETAMINOPHEN 325 MG PO TABS
650.0000 mg | ORAL_TABLET | Freq: Once | ORAL | Status: AC
Start: 2023-12-31 — End: 2023-12-31
  Administered 2023-12-31: 650 mg via ORAL
  Filled 2023-12-31: qty 2

## 2023-12-31 MED ORDER — ONDANSETRON HCL 4 MG/2ML IJ SOLN
4.0000 mg | Freq: Once | INTRAMUSCULAR | Status: AC
  Administered 2023-12-31: 4 mg via INTRAVENOUS
  Filled 2023-12-31: qty 2

## 2023-12-31 MED ORDER — LORAZEPAM 2 MG/ML IJ SOLN
1.0000 mg | Freq: Once | INTRAMUSCULAR | Status: AC
  Administered 2023-12-31: 1 mg via INTRAVENOUS
  Filled 2023-12-31: qty 1

## 2023-12-31 NOTE — ED Notes (Signed)
Patient ambulated to restroom without incident and back to bed. Fall alarm on and arm band in place.

## 2023-12-31 NOTE — ED Provider Notes (Incomplete)
[facility] Provider Note    Event Date/Time   First MD Initiated Contact with Patient 12/31/23 2304     (approximate)   History   Alcohol Intoxication and Emesis   HPI  Tracy Phillips is a 58 y.o. female with history of alcohol abuse, depression, hyperlipidemia, hypertension, presenting with alcohol intoxication.  Per patient she called 911 for nausea vomiting x 2 days.  States that she saw some blood in her vomitus today.  When she arrived, she told triage that she had drank a copious amount of wine for last 2 days.  Had been sober for the last 13 years but 2 days ago started to relapse.  When I spoke to her, she states that she was drinking all of yesterday and thinks her symptoms are secondary to the alcohol.  States that she did not drink today.  She has no thoughts of SI or HI.  No chest pain or fever or shortness of breath, no abdominal pain or diarrhea.  No melena or hematochezia.  Patient does state that she has history of alcohol withdrawals in the past.   On independent chart review she does not appear to be on any anticoagulation.  Physical Exam   Triage Vital Signs: ED Triage Vitals  Encounter Vitals Group     BP 12/31/23 2201 (!) 163/101     Systolic BP Percentile --      Diastolic BP Percentile --      Pulse Rate 12/31/23 2201 (!) 103     Resp 12/31/23 2201 20     Temp 12/31/23 2201 99.4 F (37.4 C)     Temp Source 12/31/23 2201 Oral     SpO2 12/31/23 2201 98 %     Weight 12/31/23 2202 150 lb (68 kg)     Height 12/31/23 2202 5\' 4"  (1.626 m)     Head Circumference --      Peak Flow --      Pain Score 12/31/23 2207 7     Pain Loc --      Pain Education --      Exclude from Growth Chart --     Most recent vital signs: Vitals:   01/01/24 0600 01/01/24 0700  BP:  120/80  Pulse: (!) 124 (!) 131  Resp: 20 14  Temp:    SpO2: 95% 95%     General: Awake, no distress.  CV:  Good peripheral perfusion.  Resp:  Normal effort.   Abd:  No distention.  Soft nontender Other:  Dry mucous membranes, no tongue fasciculations.  She does have some bilateral hand tremors.   ED Results / Procedures / Treatments   Labs (all labs ordered are listed, but only abnormal results are displayed) Labs Reviewed  COMPREHENSIVE METABOLIC PANEL - Abnormal; Notable for the following components:      Result Value   Sodium 134 (*)    Chloride 97 (*)    CO2 20 (*)    Glucose, Bld 132 (*)    Calcium 8.3 (*)    Anion gap 17 (*)    All other components within normal limits  ETHANOL - Abnormal; Notable for the following components:   Alcohol, Ethyl (B) 225 (*)    All other components within normal limits  SALICYLATE LEVEL - Abnormal; Notable for the following components:   Salicylate Lvl <7.0 (*)    All other components within normal limits  ACETAMINOPHEN LEVEL - Abnormal; Notable for the following components:  Acetaminophen (Tylenol), Serum <10 (*)    All other components within normal limits  URINE DRUG SCREEN, QUALITATIVE ([facility] ONLY) - Abnormal; Notable for the following components:   Tricyclic, Ur Screen POSITIVE (*)    All other components within normal limits  CBC  TYPE AND SCREEN  TROPONIN I (HIGH SENSITIVITY)  TROPONIN I (HIGH SENSITIVITY)   EKG showed sinus tachycardia, rate 100, normal QRS, normal QTc, T wave flattening in aVL, no ischemic ST elevation, not significant change compared to prior  PROCEDURES:  Critical Care performed: Yes, see critical care procedure note(s)  .Critical Care  Performed by: Claybon Jabs, MD Authorized by: Claybon Jabs, MD   Critical care provider statement:    Critical care time (minutes):  40   Critical care was time spent personally by me on the following activities:  Development of treatment plan with patient or surrogate, discussions with consultants, evaluation of patient's response to treatment, examination of patient, ordering and review of laboratory studies, ordering and  review of radiographic studies, ordering and performing treatments and interventions, pulse oximetry, re-evaluation of patient's condition and review of old charts    MEDICATIONS ORDERED IN ED: Medications  thiamine (VITAMIN B1) tablet 100 mg (has no administration in time range)    Or  thiamine (VITAMIN B1) injection 100 mg (has no administration in time range)  folic acid (FOLVITE) tablet 1 mg (has no administration in time range)  multivitamin with minerals tablet 1 tablet (has no administration in time range)  PHENObarbital (LUMINAL) injection 130 mg (has no administration in time range)  ondansetron (ZOFRAN) injection 4 mg (4 mg Intravenous Given 12/31/23 2228)  LORazepam (ATIVAN) injection 1 mg (1 mg Intravenous Given 12/31/23 2243)  acetaminophen (TYLENOL) tablet 650 mg (650 mg Oral Given 12/31/23 2243)  ondansetron (ZOFRAN) injection 4 mg (4 mg Intravenous Given 01/01/24 0016)  lactated ringers bolus 1,000 mL (0 mLs Intravenous Stopped 01/01/24 0052)  celecoxib (CELEBREX) capsule 100 mg (100 mg Oral Given 01/01/24 0051)  hydrOXYzine (ATARAX) tablet 25 mg (25 mg Oral Given 01/01/24 0051)  melatonin tablet 5 mg (5 mg Oral Given 01/01/24 0248)  metoCLOPramide (REGLAN) tablet 10 mg (10 mg Oral Given 01/01/24 0421)  QUEtiapine (SEROQUEL) tablet 100 mg (100 mg Oral Given 01/01/24 0454)  PHENObarbital (LUMINAL) injection 130 mg (130 mg Intravenous Given 01/01/24 0544)  lactated ringers bolus 1,000 mL (1,000 mLs Intravenous New Bag/Given 01/01/24 0550)     IMPRESSION / MDM / ASSESSMENT AND PLAN / ED COURSE  I reviewed the triage vital signs and the nursing notes.                              Differential diagnosis includes, but is not limited to,?  Alcohol intoxication versus early withdrawal, electrolyte derangements, gastritis, GERD, peptic ulcer disease.  For the blood in her vomitus, that was after 2 days of vomiting, considered Mallory-Weiss tear.  Also consider Koren Shiver with patient does not  look toxic appearing.  Has no chest pain or shortness of breath.  Labs were obtained out of triage, she was also given IV Ativan earlier as well as IV Zofran.  Discussed with nurse and when patient is distracted, she has no hand tremors.  Asking for more Ativan as well as her nighttime Seroquel.  Will hold off giving her more Ativan and also hold the Seroquel at this time.  She is asking for her celecoxib that will give her.  Will also put on a CIWA protocol.  Low threshold to give her phenobarbital she starts withdrawal.  Patient's presentation is most consistent with acute presentation with potential threat to life or bodily function.  On reassessment patient is awake, alert, feeling a lot better after the fluids, not tremulous, ambulatory and tolerating p.o.  Independent review of labs, her ethanol level was 225, electrolytes not severely deranged, or no AKI, LFTs are normal, no leukocytosis, her Tylenol and salicylate levels are not elevated.  Her urine drug screen did test positive for tricyclic's.  But no other drugs were noted.  No planning on discharging patient but while waiting to go home, she states that she now has some chest pain.  Also feeling nauseous.  Patient was given the Reglan, her initial Theodis Aguas was negative, EKG nonischemic.  On reassessment patient now feeling more tremulous, she has some tongue fasciculations, is tachycardic to the 130s.  Suspect that she is now in withdrawal.  Will give her IV phenobarbital as well as some additional fluids.  Will reassess, if she needs to get additional phenobarbital, she will need to be admitted for further management.  Consulted hospitalist who is agreeable with plan for admission will evaluate the patient.  She is admitted.  Clinical Course as of 01/01/24 0725  Thu Jan 01, 2024  1191 Patient was set for discharge and pending a ride home, now saying that she has chest pain as well as nausea and some vomiting.  Will give her some Reglan since.  He  had 8 mg of Zofran, will also get a troponin, EKG, chest x-ray. [TT]  [id number] On reassessment after initial phenobarbital, patient still having some tremors, some tongue fasciculations.  States that symptoms are similar to her withdrawals in the past.  Will give her another dose of IV phenobarb and plan to have her admitted for further management.  She is just getting her IV fluids now, completed about 1 to 200 cc and is tachycardic to the 120s to 130s. [TT]  [id number] Troponin X 2 is negative. [TT]    Clinical Course User Index [TT] Claybon Jabs, MD     FINAL CLINICAL IMPRESSION(S) / ED DIAGNOSES   Final diagnoses:  Alcoholic intoxication without complication (HCC)  Nausea and vomiting, unspecified vomiting type  Chest pain, unspecified type  Alcohol withdrawal syndrome with complication (HCC)     Rx / DC Orders   ED Discharge Orders     None        Note:  This document was prepared using Dragon voice recognition software and may include unintentional dictation errors.    Claybon Jabs, MD 01/01/24 4782    Claybon Jabs, MD 01/01/24 [phone]

## 2023-12-31 NOTE — ED Notes (Signed)
PT TO ED INTOXICATED (ETOH) WITH CONFUSION AND UNSTEADY GAIT. WRITER PLACED FALL BAND ON LEFT WRIST, FALL ALARM ACTIVATED AT HIGH VOLUME, BED AT LOWEST POSITION AND IN HALLWAY IN FRONT OF NURSING STATION FOR SAFETY CONCERNS. PT WITH LACED UP TENNIS SHOES IN PLACE.
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
## 2024-01-01 ENCOUNTER — Other Ambulatory Visit: Payer: Self-pay

## 2024-01-01 ENCOUNTER — Emergency Department: Payer: BC Managed Care – PPO

## 2024-01-01 ENCOUNTER — Encounter: Payer: Self-pay | Admitting: Family Medicine

## 2024-01-01 DIAGNOSIS — F1490 Cocaine use, unspecified, uncomplicated: Secondary | ICD-10-CM | POA: Diagnosis present

## 2024-01-01 DIAGNOSIS — R0789 Other chest pain: Secondary | ICD-10-CM | POA: Diagnosis not present

## 2024-01-01 DIAGNOSIS — R110 Nausea: Secondary | ICD-10-CM | POA: Diagnosis not present

## 2024-01-01 DIAGNOSIS — R112 Nausea with vomiting, unspecified: Secondary | ICD-10-CM | POA: Diagnosis not present

## 2024-01-01 DIAGNOSIS — Z860100 Personal history of colon polyps, unspecified: Secondary | ICD-10-CM | POA: Diagnosis not present

## 2024-01-01 DIAGNOSIS — E785 Hyperlipidemia, unspecified: Secondary | ICD-10-CM

## 2024-01-01 DIAGNOSIS — F10939 Alcohol use, unspecified with withdrawal, unspecified: Secondary | ICD-10-CM | POA: Diagnosis present

## 2024-01-01 DIAGNOSIS — Z79899 Other long term (current) drug therapy: Secondary | ICD-10-CM | POA: Diagnosis not present

## 2024-01-01 DIAGNOSIS — G44001 Cluster headache syndrome, unspecified, intractable: Secondary | ICD-10-CM | POA: Diagnosis not present

## 2024-01-01 DIAGNOSIS — R569 Unspecified convulsions: Secondary | ICD-10-CM | POA: Diagnosis present

## 2024-01-01 DIAGNOSIS — R079 Chest pain, unspecified: Secondary | ICD-10-CM | POA: Diagnosis present

## 2024-01-01 DIAGNOSIS — I10 Essential (primary) hypertension: Secondary | ICD-10-CM | POA: Diagnosis present

## 2024-01-01 DIAGNOSIS — F332 Major depressive disorder, recurrent severe without psychotic features: Secondary | ICD-10-CM | POA: Diagnosis present

## 2024-01-01 DIAGNOSIS — Z801 Family history of malignant neoplasm of trachea, bronchus and lung: Secondary | ICD-10-CM | POA: Diagnosis not present

## 2024-01-01 DIAGNOSIS — Z823 Family history of stroke: Secondary | ICD-10-CM | POA: Diagnosis not present

## 2024-01-01 DIAGNOSIS — F10231 Alcohol dependence with withdrawal delirium: Secondary | ICD-10-CM | POA: Diagnosis not present

## 2024-01-01 DIAGNOSIS — Z90710 Acquired absence of both cervix and uterus: Secondary | ICD-10-CM | POA: Diagnosis not present

## 2024-01-01 DIAGNOSIS — F10239 Alcohol dependence with withdrawal, unspecified: Secondary | ICD-10-CM | POA: Diagnosis present

## 2024-01-01 DIAGNOSIS — F419 Anxiety disorder, unspecified: Secondary | ICD-10-CM | POA: Diagnosis present

## 2024-01-01 DIAGNOSIS — F10930 Alcohol use, unspecified with withdrawal, uncomplicated: Secondary | ICD-10-CM | POA: Diagnosis not present

## 2024-01-01 DIAGNOSIS — Y907 Blood alcohol level of 200-239 mg/100 ml: Secondary | ICD-10-CM | POA: Diagnosis present

## 2024-01-01 DIAGNOSIS — R45851 Suicidal ideations: Secondary | ICD-10-CM | POA: Diagnosis present

## 2024-01-01 DIAGNOSIS — Z8659 Personal history of other mental and behavioral disorders: Secondary | ICD-10-CM | POA: Diagnosis not present

## 2024-01-01 DIAGNOSIS — Z83438 Family history of other disorder of lipoprotein metabolism and other lipidemia: Secondary | ICD-10-CM | POA: Diagnosis not present

## 2024-01-01 DIAGNOSIS — Z803 Family history of malignant neoplasm of breast: Secondary | ICD-10-CM | POA: Diagnosis not present

## 2024-01-01 DIAGNOSIS — Z87442 Personal history of urinary calculi: Secondary | ICD-10-CM | POA: Diagnosis not present

## 2024-01-01 DIAGNOSIS — Z833 Family history of diabetes mellitus: Secondary | ICD-10-CM | POA: Diagnosis not present

## 2024-01-01 DIAGNOSIS — G44209 Tension-type headache, unspecified, not intractable: Secondary | ICD-10-CM | POA: Diagnosis not present

## 2024-01-01 DIAGNOSIS — Z8249 Family history of ischemic heart disease and other diseases of the circulatory system: Secondary | ICD-10-CM | POA: Diagnosis not present

## 2024-01-01 DIAGNOSIS — F10229 Alcohol dependence with intoxication, unspecified: Secondary | ICD-10-CM | POA: Diagnosis present

## 2024-01-01 DIAGNOSIS — E871 Hypo-osmolality and hyponatremia: Secondary | ICD-10-CM | POA: Diagnosis present

## 2024-01-01 DIAGNOSIS — Z8261 Family history of arthritis: Secondary | ICD-10-CM | POA: Diagnosis not present

## 2024-01-01 LAB — TROPONIN I (HIGH SENSITIVITY)
Troponin I (High Sensitivity): 4 ng/L (ref <18)
Troponin I (High Sensitivity): 4 ng/L (ref <18)

## 2024-01-01 LAB — D-DIMER, QUANTITATIVE: D-Dimer, Quant: 0.49 ug{FEU}/mL (ref 0.00–0.50)

## 2024-01-01 MED ORDER — MELATONIN 5 MG PO TABS
5.0000 mg | ORAL_TABLET | Freq: Once | ORAL | Status: AC
  Administered 2024-01-01: 5 mg via ORAL
  Filled 2024-01-01: qty 1

## 2024-01-01 MED ORDER — ONDANSETRON HCL 4 MG/2ML IJ SOLN
4.0000 mg | Freq: Four times a day (QID) | INTRAMUSCULAR | Status: DC | PRN
  Administered 2024-01-01: 4 mg via INTRAVENOUS
  Filled 2024-01-01 (×2): qty 2

## 2024-01-01 MED ORDER — PHENOBARBITAL 32.4 MG PO TABS
64.8000 mg | ORAL_TABLET | Freq: Three times a day (TID) | ORAL | Status: AC
  Administered 2024-01-03 – 2024-01-05 (×6): 64.8 mg via ORAL
  Filled 2024-01-01 (×6): qty 2

## 2024-01-01 MED ORDER — PHENOBARBITAL SODIUM 130 MG/ML IJ SOLN
130.0000 mg | Freq: Once | INTRAMUSCULAR | Status: AC
Start: 2024-01-01 — End: 2024-01-01
  Administered 2024-01-01: 130 mg via INTRAVENOUS
  Filled 2024-01-01: qty 1

## 2024-01-01 MED ORDER — PHENOBARBITAL SODIUM 130 MG/ML IJ SOLN
130.0000 mg | Freq: Once | INTRAMUSCULAR | Status: AC
  Administered 2024-01-01: 130 mg via INTRAVENOUS
  Filled 2024-01-01: qty 1

## 2024-01-01 MED ORDER — ENOXAPARIN SODIUM 40 MG/0.4ML IJ SOSY
40.0000 mg | PREFILLED_SYRINGE | INTRAMUSCULAR | Status: DC
  Administered 2024-01-01 – 2024-01-05 (×5): 40 mg via SUBCUTANEOUS
  Filled 2024-01-01 (×5): qty 0.4

## 2024-01-01 MED ORDER — PANTOPRAZOLE SODIUM 40 MG PO TBEC
40.0000 mg | DELAYED_RELEASE_TABLET | Freq: Every day | ORAL | Status: DC
Start: 2024-01-01 — End: 2024-01-02
  Administered 2024-01-01 – 2024-01-02 (×2): 40 mg via ORAL
  Filled 2024-01-01 (×2): qty 1

## 2024-01-01 MED ORDER — ADULT MULTIVITAMIN W/MINERALS CH
1.0000 | ORAL_TABLET | Freq: Every day | ORAL | Status: DC
  Administered 2024-01-01 – 2024-01-05 (×5): 1 via ORAL
  Filled 2024-01-01 (×6): qty 1

## 2024-01-01 MED ORDER — THIAMINE HCL 100 MG/ML IJ SOLN
100.0000 mg | Freq: Every day | INTRAMUSCULAR | Status: DC
  Administered 2024-01-01 – 2024-01-03 (×2): 100 mg via INTRAVENOUS
  Filled 2024-01-01 (×2): qty 2

## 2024-01-01 MED ORDER — ACETAMINOPHEN 325 MG PO TABS
650.0000 mg | ORAL_TABLET | Freq: Three times a day (TID) | ORAL | Status: DC | PRN
Start: 2024-01-01 — End: 2024-01-05
  Administered 2024-01-01 – 2024-01-04 (×5): 650 mg via ORAL
  Filled 2024-01-01 (×5): qty 2

## 2024-01-01 MED ORDER — THIAMINE MONONITRATE 100 MG PO TABS
100.0000 mg | ORAL_TABLET | Freq: Every day | ORAL | Status: DC
  Administered 2024-01-02 – 2024-01-05 (×3): 100 mg via ORAL
  Filled 2024-01-01 (×4): qty 1

## 2024-01-01 MED ORDER — LACTATED RINGERS IV BOLUS
1000.0000 mL | Freq: Once | INTRAVENOUS | Status: AC
  Administered 2024-01-01: 1000 mL via INTRAVENOUS

## 2024-01-01 MED ORDER — QUETIAPINE FUMARATE 25 MG PO TABS
100.0000 mg | ORAL_TABLET | Freq: Once | ORAL | Status: AC
  Administered 2024-01-01: 100 mg via ORAL
  Filled 2024-01-01: qty 4

## 2024-01-01 MED ORDER — HYDROXYZINE HCL 25 MG PO TABS
25.0000 mg | ORAL_TABLET | Freq: Once | ORAL | Status: AC
  Administered 2024-01-01: 25 mg via ORAL
  Filled 2024-01-01: qty 1

## 2024-01-01 MED ORDER — ACETAMINOPHEN 325 MG PO TABS: 650.0000 mg | ORAL_TABLET | Freq: Four times a day (QID) | ORAL | Status: DC | PRN

## 2024-01-01 MED ORDER — PHENOBARBITAL 97.2 MG PO TABS
97.2000 mg | ORAL_TABLET | Freq: Three times a day (TID) | ORAL | Status: AC
  Administered 2024-01-01 – 2024-01-03 (×3): 97.2 mg via ORAL
  Filled 2024-01-01 (×8): qty 1

## 2024-01-01 MED ORDER — PHENOBARBITAL 32.4 MG PO TABS: 32.4000 mg | ORAL_TABLET | Freq: Three times a day (TID) | ORAL | Status: DC

## 2024-01-01 MED ORDER — LORAZEPAM 2 MG/ML IJ SOLN
1.0000 mg | INTRAMUSCULAR | Status: DC | PRN
  Administered 2024-01-01 – 2024-01-02 (×3): 1 mg via INTRAVENOUS
  Filled 2024-01-01 (×4): qty 1

## 2024-01-01 MED ORDER — METOCLOPRAMIDE HCL 10 MG PO TABS
10.0000 mg | ORAL_TABLET | Freq: Once | ORAL | Status: AC
  Administered 2024-01-01: 10 mg via ORAL
  Filled 2024-01-01: qty 1

## 2024-01-01 MED ORDER — FOLIC ACID 1 MG PO TABS
1.0000 mg | ORAL_TABLET | Freq: Every day | ORAL | Status: DC
  Administered 2024-01-01 – 2024-01-05 (×5): 1 mg via ORAL
  Filled 2024-01-01 (×5): qty 1

## 2024-01-01 MED ORDER — CELECOXIB 100 MG PO CAPS
100.0000 mg | ORAL_CAPSULE | Freq: Once | ORAL | Status: AC
  Administered 2024-01-01: 100 mg via ORAL
  Filled 2024-01-01: qty 1

## 2024-01-01 MED ORDER — ONDANSETRON HCL 4 MG PO TABS
4.0000 mg | ORAL_TABLET | Freq: Four times a day (QID) | ORAL | Status: DC | PRN
  Administered 2024-01-01 – 2024-01-02 (×2): 4 mg via ORAL
  Filled 2024-01-01 (×2): qty 1

## 2024-01-01 MED ORDER — ONDANSETRON HCL 4 MG/2ML IJ SOLN
4.0000 mg | Freq: Once | INTRAMUSCULAR | Status: AC
  Administered 2024-01-01: 4 mg via INTRAVENOUS
  Filled 2024-01-01: qty 2

## 2024-01-01 MED ORDER — QUETIAPINE FUMARATE 100 MG PO TABS
100.0000 mg | ORAL_TABLET | Freq: Every day | ORAL | Status: DC
  Administered 2024-01-02 – 2024-01-04 (×3): 100 mg via ORAL
  Filled 2024-01-01: qty 4
  Filled 2024-01-01 (×3): qty 1
  Filled 2024-01-01 (×2): qty 4

## 2024-01-01 MED ORDER — LACTATED RINGERS IV SOLN: INTRAVENOUS | Status: DC

## 2024-01-01 NOTE — Assessment & Plan Note (Addendum)
Continue Celexa, Cymbalta and Seroquel.

## 2024-01-01 NOTE — Discharge Instructions (Addendum)
Follow up with PCP and pick up medications from pharmacy. Make sure to take all antibiotics. Do not double up on narcotic/pain medication or drink alcohol during this time. Do not drive while taking narcotic medication. Call 911 or return to ER for life threatening issues or other concerns.     Intensive Outpatient Programs   [facility] [address] [place],  [place], [place] [phone]  [facility] (Inpatient and outpatient)[phone] (Suboxone and Methadone) [address] [phone]  [facility]: [facility] - Intensive Outpatient [address] [place], [place] [address], [place]  [phone]  [facility] (Outpatient, Inpatient, [facility] (Groups and Residental) (insurance only) [phone] [place], [place] [phone]   [facility] (for caregivers and family) [address] Laurell Josephs [address], [place], [place] [phone]  Residential Treatment Programs  [facility](2 years) Residential: 12 days)[facility] [address] [place], [place], [place] [phone] or [phone]  [facility] [address] [place], [place], [place] [phone]  [facility] ([facility] [address] [place], [place], [place] [phone] Admissions: 8am-3pm M-F  [facility]: Residential Program [phone] Days)             [facility]: [facility]  [place], [place], [place]  [phone] or [phone] in  Hours over the weekend or by referral)  [facility] [address] [place], [place] [address] [phone] (Do virtual or phone assessment, offer transportation within 25 miles, have in patient and Outpatient options)   [facility]: [facility]:[phone] (for crisis response 24 hours a day)

## 2024-01-01 NOTE — ED Notes (Signed)
Pt reporting chest pain to this RN and mentioned that she vomited. MD Roda Shutters made aware. Pt appears to be anxious at this time. Pt states chest pain is located in middle of chest.

## 2024-01-01 NOTE — TOC CM/SW Note (Signed)
CSW reviewed SU consult and placed resources in pt's AVS.

## 2024-01-01 NOTE — ED Notes (Signed)
Patient c/o nausea.  Zofran given.

## 2024-01-01 NOTE — H&P (Addendum)
History and Physical    Patient: Tracy Phillips [id number]:[id number] DOB: 10-30-1966 DOA: 12/31/2023 DOS: the patient was seen and examined on 01/01/2024 PCP: Dale [place], MD  Patient coming from: Home  Chief Complaint:  Chief Complaint  Patient presents with   Alcohol Intoxication   Emesis   HPI: Tracy Phillips is a 58 y.o. female with medical history significant of alcohol abuse, hypertension, depression, hyperlipidemia presenting with alcohol abuse, alcohol withdrawal.  Patient reports recent alcohol binge including drinking 1/5 of Christiane Ha as well as several boxes of wine over the past 3 to 4 days.  Patient denies any regular alcohol use though reports recent binge within the past 3 to 4 weeks.  No reported active HI or SI.  Denies any illicit drug use.  Mild chest pain.  Chest pain predominately with movement.  No reported radiation of the neck or down the left arm.  No associated diaphoresis.  No abdominal pain or diarrhea.  No focal hemiparesis or confusion.  Has had worsening tremors and anxiety over the past 12 to 24 hours. Presented to the ER afebrile, heart rate into the 130s, BP stable.  Satting well on room air.  White count 8.1, hemoglobin 14.8, platelets 282, troponin within normal limits, urine drug screen positive for TCAs, creatinine 0.63, alcohol level 225.  Chest x-ray within normal limits. Review of Systems: As mentioned in the history of present illness. All other systems reviewed and are negative. Past Medical History:  Diagnosis Date   Depression    H/O dizziness    H/O eating disorder    H/O: alcohol abuse    Herpes    H/O   History of chicken pox    History of colon polyps    Hyperlipidemia    Hypertension    Kidney stones    H/O stones   Past Surgical History:  Procedure Laterality Date   ABDOMINAL HYSTERECTOMY  1998   APPENDECTOMY  1997   DILATION AND CURETTAGE OF UTERUS     TONSILLECTOMY  1997   Social History:  reports that she has never  smoked. She has never used smokeless tobacco. She reports that she does not currently use drugs after having used the following drugs: "Crack" cocaine. She reports that she does not drink alcohol.  Allergies  Allergen Reactions   Aleve [Naproxen Sodium] Hives    Family History  Problem Relation Age of Onset   Hyperlipidemia Mother    Hypertension Mother    Breast cancer Mother 60   Arthritis Father    Hyperlipidemia Father    Hypertension Father    Diabetes Father    Hypertension Brother    Arthritis Maternal Aunt    Hyperlipidemia Maternal Aunt    Hypertension Maternal Aunt    Alcohol abuse Maternal Uncle    Arthritis Maternal Uncle    Hyperlipidemia Maternal Uncle    Hypertension Maternal Uncle    Arthritis Paternal Aunt    Hyperlipidemia Paternal Aunt    Hypertension Paternal Aunt    Arthritis Paternal Uncle    Hyperlipidemia Paternal Uncle    Hypertension Paternal Uncle    Arthritis Maternal Grandmother    Hyperlipidemia Maternal Grandmother    Hypertension Maternal Grandmother    Arthritis Maternal Grandfather    Lung cancer Maternal Grandfather    Prostate cancer Maternal Grandfather    Hyperlipidemia Maternal Grandfather    Stroke Maternal Grandfather    Hypertension Maternal Grandfather    Arthritis Paternal Grandmother    Hyperlipidemia  Paternal Grandmother    Hypertension Paternal Grandmother    Alcohol abuse Paternal Grandfather    Arthritis Paternal Grandfather    Hyperlipidemia Paternal Grandfather    Hypertension Paternal Grandfather    Sudden death Paternal Grandfather     Prior to Admission medications   Medication Sig Start Date End Date Taking? Authorizing Provider  celecoxib (CELEBREX) 100 MG capsule Take 100 mg by mouth 2 (two) times daily.   Yes [provider]  citalopram (CELEXA) 20 MG tablet Take 1 tablet (20 mg total) by mouth daily. 09/13/23  Yes Darlin Priestly, MD  DULoxetine (CYMBALTA) 30 MG capsule Take 3 capsules (90 mg total) by  mouth daily. 09/13/23  Yes Darlin Priestly, MD  methocarbamol (ROBAXIN) 750 MG tablet Take 325-750 mg by mouth every 8 (eight) hours as needed.  1/2-1 po tid prn 12/03/23  Yes [provider]  ondansetron (ZOFRAN-ODT) 4 MG disintegrating tablet Take 1 tablet (4 mg total) by mouth every 8 (eight) hours as needed for nausea or vomiting. 10/09/23  Yes Menshew, Charlesetta Ivory, PA-C  pantoprazole (PROTONIX) 40 MG tablet Take 1 tablet (40 mg total) by mouth daily. 10/09/23 01/07/24 Yes Menshew, Charlesetta Ivory, PA-C  QUEtiapine (SEROQUEL) 100 MG tablet Take 1 tablet (100 mg total) by mouth at bedtime. 12/12/23  Yes Dale [place], MD  cyclobenzaprine (FLEXERIL) 5 MG tablet Take 5 mg by mouth at bedtime as needed.    [provider]  HYDROcodone-acetaminophen (NORCO/VICODIN) 5-325 MG tablet Take by mouth. Patient not taking: Reported on 01/01/2024 12/01/23   [provider]  QUEtiapine (SEROQUEL) 50 MG tablet TAKE 1 TABLET BY MOUTH EVERYDAY AT BEDTIME Patient not taking: Reported on 01/01/2024 11/14/23   Dale [place], MD    Physical Exam: Vitals:   01/01/24 0600 01/01/24 0700 01/01/24 0800 01/01/24 0837  BP:  120/80 139/69   Pulse: (!) 124 (!) 131 (!) 122   Resp: 20 14 17    Temp:    99 F (37.2 C)  TempSrc:      SpO2: 95% 95% 94%   Weight:      Height:       Physical Exam Constitutional:      Appearance: She is normal weight.     Comments: Mild to moderate agitation at the bedside   HENT:     Head: Normocephalic and atraumatic.     Nose: Nose normal.     Mouth/Throat:     Mouth: Mucous membranes are moist.  Eyes:     Pupils: Pupils are equal, round, and reactive to light.  Cardiovascular:     Rate and Rhythm: Normal rate and regular rhythm.  Pulmonary:     Effort: Pulmonary effort is normal.  Musculoskeletal:        General: Normal range of motion.     Comments: + chest wall TTP   Skin:    General: Skin is warm.  Neurological:     Comments: + tremor on  evaluation  Otherwise grossly nonfocal neuro exam    Psychiatric:        Mood and Affect: Mood normal.     Data Reviewed:  There are no new results to review at this time.  DG Chest 2 View CLINICAL DATA:  Chest pain  EXAM: CHEST - 2 VIEW  COMPARISON:  10/09/2023  FINDINGS: Normal heart size and mediastinal contours. No acute infiltrate or edema. No effusion or pneumothorax. No acute osseous findings.  IMPRESSION: No active cardiopulmonary disease.  Electronically Signed  By: Tiburcio Pea M.D.   On: 01/01/2024 04:54  Lab Results  Component Value Date   WBC 8.1 12/31/2023   HGB 14.8 12/31/2023   HCT 44.4 12/31/2023   MCV 87.7 12/31/2023   PLT 282 12/31/2023   Last metabolic panel Lab Results  Component Value Date   GLUCOSE 132 (H) 12/31/2023   NA 134 (L) 12/31/2023   K 3.8 12/31/2023   CL 97 (L) 12/31/2023   CO2 20 (L) 12/31/2023   BUN 17 12/31/2023   CREATININE 0.63 12/31/2023   GFRNONAA >60 12/31/2023   CALCIUM 8.3 (L) 12/31/2023   PHOS 2.6 04/05/2023   PROT 7.6 12/31/2023   ALBUMIN 4.5 12/31/2023   BILITOT 0.9 12/31/2023   ALKPHOS 89 12/31/2023   AST 30 12/31/2023   ALT 27 12/31/2023   ANIONGAP 17 (H) 12/31/2023    Assessment and Plan: Alcohol abuse Severe alcohol binge w/ intake of 1/5th of Christiane Ha as well as multiple boxes of wine over past 3-4 days  ETOH leve 225  Persistent tremors with concern for DTs despite ativan and phenobarbital in ER  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  Will consult pharmacy for phenobarb dosing  CIWA protocol  Vitamin supplementation  Monitor closely   Cocaine use UDS negative for cocaine use today    Chest pain Recurrent chest pain x 12-24 hours  Appears musculoskeletal as patient has severe chest wall TTP on exam  Trop neg x 2  EKG sinus tach in setting of ETOH abuse  HEART Score 1-2  Trend trop  Pain control  Monitor   Suicidal ideation No reported SI/HI today in setting of  ETOH binge    MDD (major depressive disorder), recurrent severe, without psychosis (HCC) Cont celexa       Advance Care Planning:   Code Status: Full Code   Consults: None   Family Communication: No family at the bedside   Severity of Illness: The appropriate patient status for this patient is INPATIENT. Inpatient status is judged to be reasonable and necessary in order to provide the required intensity of service to ensure the patient's safety. The patient's presenting symptoms, physical exam findings, and initial radiographic and laboratory data in the context of their chronic comorbidities is felt to place them at high risk for further clinical deterioration. Furthermore, it is not anticipated that the patient will be medically stable for discharge from the hospital within 2 midnights of admission.   * I certify that at the point of admission it is my clinical judgment that the patient will require inpatient hospital care spanning beyond 2 midnights from the point of admission due to high intensity of service, high risk for further deterioration and high frequency of surveillance required.*  Author: Floydene Flock, MD 01/01/2024 9:07 AM  For on call review [email or web address].

## 2024-01-01 NOTE — ED Notes (Signed)
PT provided with [place] sandwich tray and Ginger Ale. Pt ABCs intact. Pt RR even and unlabored. No signs of withdrawal or distress. Pt speech is clear. No tremors. VSS.

## 2024-01-01 NOTE — ED Notes (Signed)
Patient c/o anxiousness.

## 2024-01-01 NOTE — Assessment & Plan Note (Signed)
[name] Kitchen

## 2024-01-01 NOTE — ED Notes (Signed)
Assumed patient care at 0710 and received report from the previous nurse.

## 2024-01-01 NOTE — Assessment & Plan Note (Addendum)
Cardiac enzymes negative.  IV Protonix changed to oral.  Patient hesitant about stopping her Celebrex which could be worsening her issues.  At least decreased to once a day.

## 2024-01-01 NOTE — Assessment & Plan Note (Signed)
UDS negative for cocaine use today

## 2024-01-01 NOTE — Assessment & Plan Note (Addendum)
Tracy Phillips

## 2024-01-01 NOTE — ED Notes (Addendum)
Pt disconnected self from monitoring and got up to inform RN that she was going to pass out. This RN educated pt on importance of not getting up if feeling like she was going to pass out. Call on bed to the right of pt. Jodie Echevaria MD notified

## 2024-01-02 DIAGNOSIS — G44209 Tension-type headache, unspecified, not intractable: Secondary | ICD-10-CM | POA: Diagnosis not present

## 2024-01-02 DIAGNOSIS — R079 Chest pain, unspecified: Secondary | ICD-10-CM

## 2024-01-02 DIAGNOSIS — F332 Major depressive disorder, recurrent severe without psychotic features: Secondary | ICD-10-CM | POA: Diagnosis not present

## 2024-01-02 DIAGNOSIS — F10930 Alcohol use, unspecified with withdrawal, uncomplicated: Secondary | ICD-10-CM

## 2024-01-02 LAB — CBC
HCT: 39.5 % (ref 36.0–46.0)
Hemoglobin: 13.5 g/dL (ref 12.0–15.0)
MCH: 29.9 pg (ref 26.0–34.0)
MCHC: 34.2 g/dL (ref 30.0–36.0)
MCV: 87.6 fL (ref 80.0–100.0)
Platelets: 178 10*3/uL (ref 150–400)
RBC: 4.51 MIL/uL (ref 3.87–5.11)
RDW: 14.4 % (ref 11.5–15.5)
WBC: 4.2 10*3/uL (ref 4.0–10.5)
nRBC: 0 % (ref 0.0–0.2)

## 2024-01-02 LAB — COMPREHENSIVE METABOLIC PANEL
ALT: 24 U/L (ref 0–44)
AST: 28 U/L (ref 15–41)
Albumin: 3.6 g/dL (ref 3.5–5.0)
Alkaline Phosphatase: 75 U/L (ref 38–126)
Anion gap: 7 (ref 5–15)
BUN: 15 mg/dL (ref 6–20)
CO2: 26 mmol/L (ref 22–32)
Calcium: 8.6 mg/dL — ABNORMAL LOW (ref 8.9–10.3)
Chloride: 106 mmol/L (ref 98–111)
Creatinine, Ser: 0.73 mg/dL (ref 0.44–1.00)
GFR, Estimated: >60 mL/min (ref >60)
Glucose, Bld: 105 mg/dL — ABNORMAL HIGH (ref 70–99)
Potassium: 4.1 mmol/L (ref 3.5–5.1)
Sodium: 139 mmol/L (ref 135–145)
Total Bilirubin: 1.3 mg/dL — ABNORMAL HIGH (ref 0.0–1.2)
Total Protein: 6.7 g/dL (ref 6.5–8.1)

## 2024-01-02 LAB — SEDIMENTATION RATE: Sed Rate: 7 mm/h (ref 0–30)

## 2024-01-02 MED ORDER — SODIUM CHLORIDE 0.9 % IV SOLN
12.5000 mg | Freq: Three times a day (TID) | INTRAVENOUS | Status: DC | PRN
  Administered 2024-01-02 – 2024-01-03 (×2): 12.5 mg via INTRAVENOUS
  Filled 2024-01-02 (×2): qty 12.5

## 2024-01-02 MED ORDER — MAGNESIUM SULFATE 2 GM/50ML IV SOLN
2.0000 g | Freq: Once | INTRAVENOUS | Status: AC
  Administered 2024-01-02: 2 g via INTRAVENOUS
  Filled 2024-01-02: qty 50

## 2024-01-02 MED ORDER — DULOXETINE HCL 30 MG PO CPEP
90.0000 mg | ORAL_CAPSULE | Freq: Every day | ORAL | Status: DC
  Administered 2024-01-02 – 2024-01-05 (×4): 90 mg via ORAL
  Filled 2024-01-02 (×2): qty 3
  Filled 2024-01-02: qty 1
  Filled 2024-01-02: qty 3

## 2024-01-02 MED ORDER — SODIUM CHLORIDE 0.9 % IV SOLN
12.5000 mg | Freq: Once | INTRAVENOUS | Status: AC
  Administered 2024-01-02: 12.5 mg via INTRAVENOUS
  Filled 2024-01-02: qty 12.5

## 2024-01-02 MED ORDER — CITALOPRAM HYDROBROMIDE 20 MG PO TABS
20.0000 mg | ORAL_TABLET | Freq: Every day | ORAL | Status: DC
  Administered 2024-01-02 – 2024-01-05 (×4): 20 mg via ORAL
  Filled 2024-01-02 (×4): qty 1

## 2024-01-02 MED ORDER — LORAZEPAM 2 MG/ML IJ SOLN
1.0000 mg | INTRAMUSCULAR | Status: DC | PRN
  Administered 2024-01-02 – 2024-01-03 (×5): 1 mg via INTRAVENOUS
  Filled 2024-01-02 (×5): qty 1

## 2024-01-02 MED ORDER — CELECOXIB 100 MG PO CAPS
100.0000 mg | ORAL_CAPSULE | Freq: Every day | ORAL | Status: DC
  Administered 2024-01-02 – 2024-01-05 (×4): 100 mg via ORAL
  Filled 2024-01-02 (×4): qty 1

## 2024-01-02 MED ORDER — PROCHLORPERAZINE EDISYLATE 10 MG/2ML IJ SOLN
10.0000 mg | Freq: Once | INTRAMUSCULAR | Status: AC
  Administered 2024-01-03: 10 mg via INTRAVENOUS
  Filled 2024-01-02 (×2): qty 2

## 2024-01-02 MED ORDER — DEXTROSE-SODIUM CHLORIDE 5-0.9 % IV SOLN: INTRAVENOUS | Status: AC

## 2024-01-02 MED ORDER — METHOCARBAMOL 500 MG PO TABS
500.0000 mg | ORAL_TABLET | Freq: Three times a day (TID) | ORAL | Status: DC | PRN
  Administered 2024-01-04 – 2024-01-05 (×2): 500 mg via ORAL
  Filled 2024-01-02 (×2): qty 1

## 2024-01-02 MED ORDER — LORAZEPAM 1 MG PO TABS: 1.0000 mg | ORAL_TABLET | ORAL | Status: DC | PRN

## 2024-01-02 MED ORDER — PANTOPRAZOLE SODIUM 40 MG IV SOLR
40.0000 mg | Freq: Two times a day (BID) | INTRAVENOUS | Status: DC
  Administered 2024-01-02 – 2024-01-05 (×7): 40 mg via INTRAVENOUS
  Filled 2024-01-02 (×6): qty 10

## 2024-01-02 NOTE — Assessment & Plan Note (Addendum)
Sedimentation rate 7 so this is not temporal arteritis.  Patient did not want steroids or imaging of the brain.  Patient was given IV Phenergan and IV magnesium during the hospital course.  Stress and anxiety could also be playing a role with her headache.

## 2024-01-02 NOTE — Progress Notes (Signed)
Progress Note   Patient: Tracy Phillips [id number] DOB: [birth year] DOA: 12/31/2023     1 DOS: the patient was seen and examined on 01/02/2024   Brief hospital course: 58 y.o. female with medical history significant of alcohol abuse, hypertension, depression, hyperlipidemia presenting with alcohol abuse, alcohol withdrawal.  Patient reports recent alcohol binge including drinking 1/5 of Christiane Ha as well as several boxes of wine over the past 3 to 4 days.  Patient denies any regular alcohol use though reports recent binge within the past 3 to 4 weeks.  No reported active HI or SI.  Denies any illicit drug use.  Mild chest pain.  Chest pain predominately with movement.  No reported radiation of the neck or down the left arm.  No associated diaphoresis.  No abdominal pain or diarrhea.  No focal hemiparesis or confusion.  Has had worsening tremors and anxiety over the past 12 to 24 hours. Presented to the ER afebrile, heart rate into the 130s, BP stable.  Satting well on room air.  White count 8.1, hemoglobin 14.8, platelets 282, troponin within normal limits, urine drug screen positive for TCAs, creatinine 0.63, alcohol level 225.  Chest x-ray within normal limits.  2/14.  Patient on phenobarbital alcohol withdrawal protocol plus as needed Ativan.  Patient complains of anxiety, headache and blurry vision left eye.  Sedimentation rate 7.  Patient given Phenergan and magnesium for headache.  Patient did not want steroids.  Assessment and Plan: * Alcohol withdrawal (HCC) Patient on phenobarbital taper and Ativan as needed.  Patient still has quite a bit of tremor.  Chest pain Cardiac enzymes negative  MDD (major depressive disorder), recurrent severe, without psychosis (HCC) Continue celexa   Headache Sedimentation rate 7 so this is not temporal arteritis.  Gave IV magnesium and IV Phenergan.  Patient did not want steroids.        Subjective: Patient stated she went on a binge drinking  and is now withdrawing.  She states that she has been sober in the past for 13 years.  Still feeling very shaky and anxious.  Physical Exam: Vitals:   01/02/24 1023 01/02/24 1400 01/02/24 1445 01/02/24 1600  BP: (!) 156/83  (!) 167/91 (!) 187/86  Pulse: 98 95 (!) 108 98  Resp: 15 18 (!) 27   Temp: 98.9 F (37.2 C)     TempSrc: Oral     SpO2: 100% 99% 100% 98%  Weight:      Height:       Physical Exam HENT:     Head: Normocephalic.     Mouth/Throat:     Pharynx: No oropharyngeal exudate.  Eyes:     General: Lids are normal.     Conjunctiva/sclera: Conjunctivae normal.  Cardiovascular:     Rate and Rhythm: Normal rate and regular rhythm.     Heart sounds: Normal heart sounds, S1 normal and S2 normal.  Pulmonary:     Breath sounds: No decreased breath sounds, wheezing, rhonchi or rales.  Abdominal:     Palpations: Abdomen is soft.     Tenderness: There is no abdominal tenderness.  Musculoskeletal:     Right lower leg: No swelling.     Left lower leg: No swelling.  Skin:    General: Skin is warm.     Findings: No rash.  Neurological:     Mental Status: She is alert and oriented to person, place, and time.     Comments: Positive tremor bilaterally  Data Reviewed: Chest x-ray negative, creatinine 0.73, liver function test normal range except for total bilirubin slightly elevated at 1.3, CBC normal range platelets on the lower side of normal at 178  Disposition: Status is: Inpatient Remains inpatient appropriate because: On phenobarbital withdrawal protocol.  Planned Discharge Destination: Home    Time spent: 28 minutes  Author: Alford Highland, MD 01/02/2024 4:03 PM  For on call review [email or web address].

## 2024-01-02 NOTE — Assessment & Plan Note (Addendum)
Patient was on phenobarbital taper for withdrawal protocol during the hospital course.  Patient was also on Ativan as needed.  Will prescribe an Ativan taper upon going home.

## 2024-01-02 NOTE — Hospital Course (Addendum)
58 y.o. female with medical history significant of alcohol abuse, hypertension, depression, hyperlipidemia presenting with alcohol abuse, alcohol withdrawal.  Patient reports recent alcohol binge including drinking 1/5 of Christiane Ha as well as several boxes of wine over the past 3 to 4 days.  Patient denies any regular alcohol use though reports recent binge within the past 3 to 4 weeks.  No reported active HI or SI.  Denies any illicit drug use.  Mild chest pain.  Chest pain predominately with movement.  No reported radiation of the neck or down the left arm.  No associated diaphoresis.  No abdominal pain or diarrhea.  No focal hemiparesis or confusion.  Has had worsening tremors and anxiety over the past 12 to 24 hours. Presented to the ER afebrile, heart rate into the 130s, BP stable.  Satting well on room air.  White count 8.1, hemoglobin 14.8, platelets 282, troponin within normal limits, urine drug screen positive for TCAs, creatinine 0.63, alcohol level 225.  Chest x-ray within normal limits.  2/14.  Patient on phenobarbital alcohol withdrawal protocol plus as needed Ativan.  Patient complains of anxiety, headache and blurry vision left eye.  Sedimentation rate 7.  Patient given Phenergan and magnesium for headache.  Patient did not want steroids. 2/15.  Patient still having a headache.  Still feeling shaky.  Still feeling nauseous.  Was able to eat a little bit this morning. 2/16.  Patient vomited up breakfast.  She was able to eat lunch but then started feeling worse after lunch. 2/17.  Patient feeling better after lunch and was interested in going home.  Will prescribe an Ativan taper upon going home and as needed nausea medication.

## 2024-01-03 ENCOUNTER — Other Ambulatory Visit: Payer: Self-pay | Admitting: Internal Medicine

## 2024-01-03 DIAGNOSIS — R519 Headache, unspecified: Secondary | ICD-10-CM

## 2024-01-03 DIAGNOSIS — F10930 Alcohol use, unspecified with withdrawal, uncomplicated: Secondary | ICD-10-CM | POA: Diagnosis not present

## 2024-01-03 DIAGNOSIS — R079 Chest pain, unspecified: Secondary | ICD-10-CM | POA: Diagnosis not present

## 2024-01-03 DIAGNOSIS — F332 Major depressive disorder, recurrent severe without psychotic features: Secondary | ICD-10-CM | POA: Diagnosis not present

## 2024-01-03 DIAGNOSIS — R110 Nausea: Secondary | ICD-10-CM

## 2024-01-03 MED ORDER — MAGNESIUM SULFATE 2 GM/50ML IV SOLN
2.0000 g | Freq: Once | INTRAVENOUS | Status: AC
  Administered 2024-01-03: 2 g via INTRAVENOUS
  Filled 2024-01-03: qty 50

## 2024-01-03 MED ORDER — PROCHLORPERAZINE EDISYLATE 10 MG/2ML IJ SOLN
10.0000 mg | Freq: Four times a day (QID) | INTRAMUSCULAR | Status: DC | PRN
  Administered 2024-01-03 – 2024-01-05 (×3): 10 mg via INTRAVENOUS
  Filled 2024-01-03 (×7): qty 2

## 2024-01-03 MED ORDER — LORAZEPAM 2 MG/ML IJ SOLN
1.0000 mg | INTRAMUSCULAR | Status: DC | PRN
  Administered 2024-01-03 – 2024-01-04 (×3): 1 mg via INTRAVENOUS
  Filled 2024-01-03 (×3): qty 1

## 2024-01-03 NOTE — Progress Notes (Signed)
Progress Note   Patient: Tracy Phillips [id number]:[id number] DOB: [birth year] DOA: 12/31/2023     2 DOS: the patient was seen and examined on 01/03/2024   Brief hospital course: 58 y.o. female with medical history significant of alcohol abuse, hypertension, depression, hyperlipidemia presenting with alcohol abuse, alcohol withdrawal.  Patient reports recent alcohol binge including drinking 1/5 of Christiane Ha as well as several boxes of wine over the past 3 to 4 days.  Patient denies any regular alcohol use though reports recent binge within the past 3 to 4 weeks.  No reported active HI or SI.  Denies any illicit drug use.  Mild chest pain.  Chest pain predominately with movement.  No reported radiation of the neck or down the left arm.  No associated diaphoresis.  No abdominal pain or diarrhea.  No focal hemiparesis or confusion.  Has had worsening tremors and anxiety over the past 12 to 24 hours. Presented to the ER afebrile, heart rate into the 130s, BP stable.  Satting well on room air.  White count 8.1, hemoglobin 14.8, platelets 282, troponin within normal limits, urine drug screen positive for TCAs, creatinine 0.63, alcohol level 225.  Chest x-ray within normal limits.  2/14.  Patient on phenobarbital alcohol withdrawal protocol plus as needed Ativan.  Patient complains of anxiety, headache and blurry vision left eye.  Sedimentation rate 7.  Patient given Phenergan and magnesium for headache.  Patient did not want steroids. 2/15.  Patient still having a headache.  Still feeling shaky.  Still feeling nauseous.  Was able to eat a little bit this morning.  Assessment and Plan: * Alcohol withdrawal (HCC) Patient on phenobarbital taper and Ativan as needed.  Patient still has quite a bit of tremor.  Chest pain Cardiac enzymes negative, continue IV Protonix  Nausea As needed Phenergan and Compazine for breakthrough nausea.  MDD (major depressive disorder), recurrent severe, without psychosis  (HCC) Continue celexa   Headache Sedimentation rate 7 so this is not temporal arteritis.  As needed IV Phenergan.  Will give IV magnesium again today.  Patient did not want steroids or imaging of the brain.        Subjective: Patient still feeling shaky.  Still has a headache.  Still with nausea but able to eat a little bit this morning.  Admitted with alcohol withdrawal  Physical Exam: Vitals:   01/02/24 2308 01/03/24 0313 01/03/24 0957 01/03/24 1154  BP: 135/85 (!) 128/93 (!) 141/67 (!) 148/84  Pulse: 97 85 73 83  Resp:  20 17 18   Temp: 98.5 F (36.9 C) 98.8 F (37.1 C) 98.4 F (36.9 C) 98.6 F (37 C)  TempSrc: Oral Oral Oral Oral  SpO2: 92% 98% 97% 100%  Weight:      Height:       Physical Exam HENT:     Head: Normocephalic.     Mouth/Throat:     Pharynx: No oropharyngeal exudate.  Eyes:     General: Lids are normal.     Conjunctiva/sclera: Conjunctivae normal.  Cardiovascular:     Rate and Rhythm: Normal rate and regular rhythm.     Heart sounds: Normal heart sounds, S1 normal and S2 normal.  Pulmonary:     Breath sounds: No decreased breath sounds, wheezing, rhonchi or rales.  Abdominal:     Palpations: Abdomen is soft.     Tenderness: There is no abdominal tenderness.  Musculoskeletal:     Right lower leg: No swelling.     Left lower  leg: No swelling.  Skin:    General: Skin is warm.     Findings: No rash.  Neurological:     Mental Status: She is alert and oriented to person, place, and time.     Comments: Positive tremor bilaterally     Data Reviewed: Creatinine 0.73, total bilirubin 1.3 but other liver function test normal, CBC normal range  Family Communication: Declined  Disposition: Status is: Inpatient Remains inpatient appropriate because: Continue phenobarbital and Ativan for alcohol withdrawal protocol  Planned Discharge Destination: Home    Time spent: 28 minutes  Author: Alford Highland, MD 01/03/2024 2:22 PM  For on call  review [email or web address].

## 2024-01-03 NOTE — Assessment & Plan Note (Signed)
As needed Phenergan and Compazine for breakthrough nausea.

## 2024-01-04 DIAGNOSIS — G44001 Cluster headache syndrome, unspecified, intractable: Secondary | ICD-10-CM | POA: Diagnosis not present

## 2024-01-04 DIAGNOSIS — R079 Chest pain, unspecified: Secondary | ICD-10-CM | POA: Diagnosis not present

## 2024-01-04 DIAGNOSIS — F10930 Alcohol use, unspecified with withdrawal, uncomplicated: Secondary | ICD-10-CM | POA: Diagnosis not present

## 2024-01-04 DIAGNOSIS — R112 Nausea with vomiting, unspecified: Secondary | ICD-10-CM | POA: Diagnosis not present

## 2024-01-04 MED ORDER — LORAZEPAM 1 MG PO TABS
1.0000 mg | ORAL_TABLET | Freq: Four times a day (QID) | ORAL | Status: DC | PRN
  Administered 2024-01-04 – 2024-01-05 (×3): 1 mg via ORAL
  Filled 2024-01-04 (×3): qty 1

## 2024-01-04 NOTE — Progress Notes (Signed)
Progress Note   Patient: Tracy Phillips MVH:[id number] DOB: June 04, 1966 DOA: 12/31/2023     3 DOS: the patient was seen and examined on 01/04/2024   Brief hospital course: 58 y.o. female with medical history significant of alcohol abuse, hypertension, depression, hyperlipidemia presenting with alcohol abuse, alcohol withdrawal.  Patient reports recent alcohol binge including drinking 1/5 of Christiane Ha as well as several boxes of wine over the past 3 to 4 days.  Patient denies any regular alcohol use though reports recent binge within the past 3 to 4 weeks.  No reported active HI or SI.  Denies any illicit drug use.  Mild chest pain.  Chest pain predominately with movement.  No reported radiation of the neck or down the left arm.  No associated diaphoresis.  No abdominal pain or diarrhea.  No focal hemiparesis or confusion.  Has had worsening tremors and anxiety over the past 12 to 24 hours. Presented to the ER afebrile, heart rate into the 130s, BP stable.  Satting well on room air.  White count 8.1, hemoglobin 14.8, platelets 282, troponin within normal limits, urine drug screen positive for TCAs, creatinine 0.63, alcohol level 225.  Chest x-ray within normal limits.  2/14.  Patient on phenobarbital alcohol withdrawal protocol plus as needed Ativan.  Patient complains of anxiety, headache and blurry vision left eye.  Sedimentation rate 7.  Patient given Phenergan and magnesium for headache.  Patient did not want steroids. 2/15.  Patient still having a headache.  Still feeling shaky.  Still feeling nauseous.  Was able to eat a little bit this morning. 2/16.  Patient vomited up breakfast.  She was able to eat lunch but then started feeling worse after lunch.  Assessment and Plan: * Alcohol withdrawal (HCC) Patient on phenobarbital taper and IV Ativan as needed.  Patient's tremor is a little bit less today.  Nausea and vomiting Patient ate a big breakfast and then vomiting.  Patient ate some lunch  and did not feel well afterwards.  Patient on as needed nausea medications.  Chest pain Cardiac enzymes negative, continue IV Protonix  Headache Sedimentation rate 7 so this is not temporal arteritis.  As needed IV Phenergan.  Will give IV magnesium again today.  Patient did not want steroids or imaging of the brain.  MDD (major depressive disorder), recurrent severe, without psychosis (HCC) Continue Celexa, Cymbalta and Seroquel.        Subjective: Patient ate a large breakfast and then vomiting.  Patient ate some for lunch but then did not feel well.  Admitted with alcohol withdrawal.  Physical Exam: Vitals:   01/04/24 0311 01/04/24 0749 01/04/24 0857 01/04/24 1245  BP: 109/89 130/80 125/80 (!) 151/86  Pulse: 71 85 80 91  Resp:  16  16  Temp: 98.1 F (36.7 C) 98.2 F (36.8 C)  98.4 F (36.9 C)  TempSrc: Oral Oral  Oral  SpO2: 98% 98%  99%  Weight:      Height:       Physical Exam HENT:     Head: Normocephalic.     Mouth/Throat:     Pharynx: No oropharyngeal exudate.  Eyes:     General: Lids are normal.     Conjunctiva/sclera: Conjunctivae normal.  Cardiovascular:     Rate and Rhythm: Normal rate and regular rhythm.     Heart sounds: Normal heart sounds, S1 normal and S2 normal.  Pulmonary:     Breath sounds: No decreased breath sounds, wheezing, rhonchi or rales.  Abdominal:     Palpations: Abdomen is soft.     Tenderness: There is abdominal tenderness in the epigastric area.  Musculoskeletal:     Right lower leg: No swelling.     Left lower leg: No swelling.  Skin:    General: Skin is warm.     Findings: No rash.  Neurological:     Mental Status: She is alert and oriented to person, place, and time.     Comments: Less tremor today than yesterday.     Data Reviewed: Last creatinine 0.73 last CBC normal range  Disposition: Status is: Inpatient Remains inpatient appropriate because: Treating for alcohol withdrawal.  Vomited this morning  Planned  Discharge Destination: Home    Time spent: 28 minutes  Author: Alford Highland, MD 01/04/2024 1:42 PM  For on call review [email or web address].

## 2024-01-04 NOTE — Evaluation (Signed)
Physical Therapy Evaluation Patient Details Name: Tracy Phillips MRN: [id number] DOB: 15-Dec-1965 Today's Date: 01/04/2024  History of Present Illness  Tracy Phillips is a 57 y.o. female with medical history significant of alcohol abuse, hypertension, depression, hyperlipidemia presenting with alcohol abuse, alcohol withdrawal.  Clinical Impression  Patient received standing in room without AD trying to put on gown. She ambulated 125 with supervision and No AD. She has antalgic gait due to reports of left hip and back pain that are chronic. Patient reports she normally gets around better when she is taking her anti inflammatory medication. Patient may benefit from cane to improve quality of gait and reduce pain. We will follow up with her to educate on AD use.           If plan is discharge home, recommend the following: Help with stairs or ramp for entrance   Can travel by private vehicle    yes    Equipment Recommendations Cane  Recommendations for Other Services       Functional Status Assessment Patient has had a recent decline in their functional status and demonstrates the ability to make significant improvements in function in a reasonable and predictable amount of time.     Precautions / Restrictions Precautions Precautions: Fall Restrictions Weight Bearing Restrictions Per Provider Order: No      Mobility  Bed Mobility Overal bed mobility: Independent                  Transfers Overall transfer level: Independent                 General transfer comment: Patient received standing independently in room    Ambulation/Gait Ambulation/Gait assistance: Independent Gait Distance (Feet): 125 Feet Assistive device: None Gait Pattern/deviations: Step-through pattern, Antalgic, Decreased weight shift to left Gait velocity: decr     General Gait Details: antalgic gait due to left hip pain and back pain. Would benefit from [facility]  Stairs             Wheelchair Mobility     Tilt Bed    Modified Rankin (Stroke Patients Only)       Balance Overall balance assessment: Independent                                           Pertinent Vitals/Pain Pain Assessment Pain Assessment: 0-10 Pain Score: 6  Pain Location: Back and left hip ( chronic) Pain Descriptors / Indicators: Discomfort, Sore Pain Intervention(s): Patient requesting pain meds-RN notified, Monitored during session, Repositioned    Home Living Family/patient expects to be discharged to:: Private residence Living Arrangements: Alone Available Help at Discharge: Family;Available PRN/intermittently Type of Home: House Home Access: Stairs to enter   Entrance Stairs-Number of Steps: 3   Home Layout: Two level;Able to live on main level with bedroom/bathroom Home Equipment: Rolling Walker (2 wheels) Additional Comments: May have RW in garage    Prior Function Prior Level of Function : Independent/Modified Independent;Working/employed;Driving             Mobility Comments: works from home ADLs Comments: independent     Extremity/Trunk Assessment   Upper Extremity Assessment Upper Extremity Assessment: Overall WFL for tasks assessed    Lower Extremity Assessment Lower Extremity Assessment: Overall WFL for tasks assessed;LLE deficits/detail LLE Deficits / Details: Chronic hip and back pain, reports she receives injections and takes  anti inflammatories LLE Coordination: decreased gross motor    Cervical / Trunk Assessment Cervical / Trunk Assessment: Normal  Communication   Communication Communication: No apparent difficulties    Cognition Arousal: Alert Behavior During Therapy: WFL for tasks assessed/performed   PT - Cognitive impairments: No apparent impairments                         Following commands: Intact       Cueing Cueing Techniques: Verbal cues     General Comments      Exercises      Assessment/Plan    PT Assessment Patient needs continued PT services  PT Problem List Decreased knowledge of use of DME;Decreased mobility;Pain       PT Treatment Interventions DME instruction;Stair training;Gait training;Patient/family education    PT Goals (Current goals can be found in the Care Plan section)  Acute Rehab PT Goals Patient Stated Goal: decrease pain PT Goal Formulation: With patient Time For Goal Achievement: 01/11/24 Potential to Achieve Goals: Good    Frequency Min 1X/week     Co-evaluation               AM-PAC PT "6 Clicks" Mobility  Outcome Measure Help needed turning from your back to your side while in a flat bed without using bedrails?: None Help needed moving from lying on your back to sitting on the side of a flat bed without using bedrails?: None Help needed moving to and from a bed to a chair (including a wheelchair)?: None Help needed standing up from a chair using your arms (e.g., wheelchair or bedside chair)?: None Help needed to walk in hospital room?: None Help needed climbing 3-5 steps with a railing? : A Little 6 Click Score: 23    End of Session   Activity Tolerance: Patient limited by pain Patient left: in bed;with call bell/phone within reach Nurse Communication: Mobility status;Other (comment) (Requesting anti inflammatories) PT Visit Diagnosis: Other abnormalities of gait and mobility (R26.89)    Time: 0955-1005 PT Time Calculation (min) (ACUTE ONLY): 10 min   Charges:   PT Evaluation $PT Eval Low Complexity: 1 Low   PT General Charges $$ ACUTE PT VISIT: 1 Visit         Cashlyn Huguley, PT, GCS 01/04/24,10:17 AM

## 2024-01-04 NOTE — Assessment & Plan Note (Signed)
Patient ate lunch today and tolerated that.  Will give as needed Phenergan to go home with.

## 2024-01-05 DIAGNOSIS — G44039 Episodic paroxysmal hemicrania, not intractable: Secondary | ICD-10-CM

## 2024-01-05 DIAGNOSIS — R0789 Other chest pain: Secondary | ICD-10-CM

## 2024-01-05 DIAGNOSIS — E871 Hypo-osmolality and hyponatremia: Secondary | ICD-10-CM | POA: Diagnosis not present

## 2024-01-05 DIAGNOSIS — F10930 Alcohol use, unspecified with withdrawal, uncomplicated: Secondary | ICD-10-CM | POA: Diagnosis not present

## 2024-01-05 DIAGNOSIS — R112 Nausea with vomiting, unspecified: Secondary | ICD-10-CM | POA: Diagnosis not present

## 2024-01-05 LAB — CBC
HCT: 38.9 % (ref 36.0–46.0)
Hemoglobin: 13.1 g/dL (ref 12.0–15.0)
MCH: 30 pg (ref 26.0–34.0)
MCHC: 33.7 g/dL (ref 30.0–36.0)
MCV: 89 fL (ref 80.0–100.0)
Platelets: 196 10*3/uL (ref 150–400)
RBC: 4.37 MIL/uL (ref 3.87–5.11)
RDW: 14.4 % (ref 11.5–15.5)
WBC: 5.2 10*3/uL (ref 4.0–10.5)
nRBC: 0 % (ref 0.0–0.2)

## 2024-01-05 LAB — BASIC METABOLIC PANEL
Anion gap: 8 (ref 5–15)
BUN: 23 mg/dL — ABNORMAL HIGH (ref 6–20)
CO2: 27 mmol/L (ref 22–32)
Calcium: 8.9 mg/dL (ref 8.9–10.3)
Chloride: 102 mmol/L (ref 98–111)
Creatinine, Ser: 0.73 mg/dL (ref 0.44–1.00)
GFR, Estimated: >60 mL/min (ref >60)
Glucose, Bld: 83 mg/dL (ref 70–99)
Potassium: 4.3 mmol/L (ref 3.5–5.1)
Sodium: 137 mmol/L (ref 135–145)

## 2024-01-05 LAB — MAGNESIUM: Magnesium: 2.1 mg/dL (ref 1.7–2.4)

## 2024-01-05 MED ORDER — ADULT MULTIVITAMIN W/MINERALS CH: 1.0000 | ORAL_TABLET | Freq: Every day | ORAL | Status: DC

## 2024-01-05 MED ORDER — FOLIC ACID 1 MG PO TABS: 1.0000 mg | ORAL_TABLET | Freq: Every day | ORAL | 0 refills | Status: DC

## 2024-01-05 MED ORDER — POLYETHYLENE GLYCOL 3350 17 G PO PACK: 17.0000 g | PACK | Freq: Every day | ORAL | 0 refills | Status: DC

## 2024-01-05 MED ORDER — CELECOXIB 100 MG PO CAPS: 100.0000 mg | ORAL_CAPSULE | Freq: Every day | ORAL | Status: DC

## 2024-01-05 MED ORDER — BISACODYL 5 MG PO TBEC
5.0000 mg | DELAYED_RELEASE_TABLET | Freq: Once | ORAL | Status: AC
  Administered 2024-01-05: 5 mg via ORAL
  Filled 2024-01-05: qty 1

## 2024-01-05 MED ORDER — PROMETHAZINE HCL 25 MG PO TABS: 25.0000 mg | ORAL_TABLET | Freq: Three times a day (TID) | ORAL | 0 refills | Status: DC | PRN

## 2024-01-05 MED ORDER — VITAMIN B-1 100 MG PO TABS: 100.0000 mg | ORAL_TABLET | Freq: Every day | ORAL | 0 refills | Status: DC

## 2024-01-05 MED ORDER — POLYETHYLENE GLYCOL 3350 17 G PO PACK
17.0000 g | PACK | Freq: Every day | ORAL | Status: DC
  Administered 2024-01-05: 17 g via ORAL
  Filled 2024-01-05: qty 1

## 2024-01-05 MED ORDER — PANTOPRAZOLE SODIUM 40 MG PO TBEC: 40.0000 mg | DELAYED_RELEASE_TABLET | Freq: Every day | ORAL | 0 refills | Status: DC

## 2024-01-05 MED ORDER — LORAZEPAM 1 MG PO TABS
ORAL_TABLET | ORAL | 0 refills | Status: DC
Start: 2024-01-05 — End: 2024-01-21

## 2024-01-05 NOTE — TOC Progression Note (Signed)
Transition of Care [facility]) - Progression Note    Patient Details  Name: Tracy Phillips MRN: [id number] Date of Birth: 1966/11/06  Transition of Care [facility]) CM/SW Contact  Truddie Hidden, RN Phone Number: 01/05/2024, 9:19 AM  Clinical Narrative:    TOC continuing to follow patient's progress throughout discharge planning.        Expected Discharge Plan and Services                                               Social Determinants of Health (SDOH) Interventions SDOH Screenings   Food Insecurity: No Food Insecurity (01/01/2024)  Housing: Low Risk  (01/01/2024)  Recent Concern: Housing - High Risk (10/27/2023)   Received from [facility]  Transportation Needs: No Transportation Needs (01/01/2024)  Utilities: Not At Risk (01/01/2024)  Alcohol Screen: Low Risk  (09/25/2023)  Depression (PHQ2-9): High Risk (12/11/2023)  Financial Resource Strain: Medium Risk (11/04/2023)   Received from [facility]  Physical Activity: Inactive (09/25/2023)  Social Connections: Moderately Isolated (01/01/2024)  Stress: Stress Concern Present (09/25/2023)  Tobacco Use: Low Risk  (01/01/2024)  Health Literacy: Adequate Health Literacy (09/25/2023)    Readmission Risk Interventions     No data to display

## 2024-01-05 NOTE — Progress Notes (Signed)
PT note  DC orders in.  Pt up walking in room with no AD.  Pt trialed SPC in room as suggested at eval yesterday.  Stated cane did help her gait and she sees the benefit of it.  Stated she will plan to pick one up at discharge when she gets her medications.  No charge as session was only a few minutes.  No further questions or concerns.

## 2024-01-05 NOTE — Discharge Summary (Signed)
Physician Discharge Summary   Patient: Tracy Phillips MRN: [id number] DOB: [birth year]  Admit date:     12/31/2023  Discharge date: 01/05/24  Discharge Physician: Alford Highland   PCP: Dale Bliss, MD   Recommendations at discharge:   Follow-up PCP 5 days  Discharge Diagnoses: Principal Problem:   Alcohol withdrawal (HCC) Active Problems:   Nausea and vomiting   Chest pain   Headache   MDD (major depressive disorder), recurrent severe, without psychosis (HCC)   Hyponatremia    Hospital Course: 58 y.o. female with medical history significant of alcohol abuse, hypertension, depression, hyperlipidemia presenting with alcohol abuse, alcohol withdrawal.  Patient reports recent alcohol binge including drinking 1/5 of Christiane Ha as well as several boxes of wine over the past 3 to 4 days.  Patient denies any regular alcohol use though reports recent binge within the past 3 to 4 weeks.  No reported active HI or SI.  Denies any illicit drug use.  Mild chest pain.  Chest pain predominately with movement.  No reported radiation of the neck or down the left arm.  No associated diaphoresis.  No abdominal pain or diarrhea.  No focal hemiparesis or confusion.  Has had worsening tremors and anxiety over the past 12 to 24 hours. Presented to the ER afebrile, heart rate into the 130s, BP stable.  Satting well on room air.  White count 8.1, hemoglobin 14.8, platelets 282, troponin within normal limits, urine drug screen positive for TCAs, creatinine 0.63, alcohol level 225.  Chest x-ray within normal limits.  2/14.  Patient on phenobarbital alcohol withdrawal protocol plus as needed Ativan.  Patient complains of anxiety, headache and blurry vision left eye.  Sedimentation rate 7.  Patient given Phenergan and magnesium for headache.  Patient did not want steroids. 2/15.  Patient still having a headache.  Still feeling shaky.  Still feeling nauseous.  Was able to eat a little bit this morning. 2/16.   Patient vomited up breakfast.  She was able to eat lunch but then started feeling worse after lunch. 2/17.  Patient feeling better after lunch and was interested in going home.  Will prescribe an Ativan taper upon going home and as needed nausea medication.  Assessment and Plan: * Alcohol withdrawal (HCC) Patient was on phenobarbital taper for withdrawal protocol during the hospital course.  Patient was also on Ativan as needed.  Will prescribe an Ativan taper upon going home.  Nausea and vomiting Patient ate lunch today and tolerated that.  Will give as needed Phenergan to go home with.  Chest pain Cardiac enzymes negative.  IV Protonix changed to oral.  Patient hesitant about stopping her Celebrex which could be worsening her issues.  At least decreased to once a day.  Headache Sedimentation rate 7 so this is not temporal arteritis.  Patient did not want steroids or imaging of the brain.  Patient was given IV Phenergan and IV magnesium during the hospital course.  Stress and anxiety could also be playing a role with her headache.  Hyponatremia Sodium 1 point less than the normal range on admission but normal upon discharge.  MDD (major depressive disorder), recurrent severe, without psychosis (HCC) Continue Celexa, Cymbalta and Seroquel.  Ativan taper prescribed upon discharge.  Patient will speak with her PCP about adjusting her medications for anxiety and depression.         Consultants: None Procedures performed: None Disposition: Home Diet recommendation:  Regular diet DISCHARGE MEDICATION: Allergies as of 01/05/2024  Reactions   Aleve [naproxen Sodium] Hives        Medication List     STOP taking these medications    cyclobenzaprine 5 MG tablet Commonly known as: FLEXERIL   ondansetron 4 MG disintegrating tablet Commonly known as: ZOFRAN-ODT       TAKE these medications    celecoxib 100 MG capsule Commonly known as: CELEBREX Take 1 capsule (100  mg total) by mouth daily. What changed: when to take this   citalopram 20 MG tablet Commonly known as: CELEXA Take 1 tablet (20 mg total) by mouth daily.   DULoxetine 30 MG capsule Commonly known as: CYMBALTA Take 3 capsules (90 mg total) by mouth daily.   folic acid 1 MG tablet Commonly known as: FOLVITE Take 1 tablet (1 mg total) by mouth daily. Start taking on: January 06, 2024   LORazepam 1 MG tablet Commonly known as: ATIVAN One tab po every six hours for two days, one tab po every eight hours for two days; one tab po every 12 hours for two days then one tab po daily for three days   methocarbamol 750 MG tablet Commonly known as: ROBAXIN Take 325-750 mg by mouth every 8 (eight) hours as needed.  1/2-1 po tid prn   multivitamin with minerals Tabs tablet Take 1 tablet by mouth daily. Start taking on: January 06, 2024   pantoprazole 40 MG tablet Commonly known as: PROTONIX Take 1 tablet (40 mg total) by mouth daily.   polyethylene glycol 17 g packet Commonly known as: MIRALAX / GLYCOLAX Take 17 g by mouth daily. Start taking on: January 06, 2024   promethazine 25 MG tablet Commonly known as: PHENERGAN Take 1 tablet (25 mg total) by mouth every 8 (eight) hours as needed for nausea or vomiting.   QUEtiapine 100 MG tablet Commonly known as: SEROQUEL Take 1 tablet (100 mg total) by mouth at bedtime.   thiamine 100 MG tablet Commonly known as: Vitamin B-1 Take 1 tablet (100 mg total) by mouth daily. Start taking on: January 06, 2024        Follow-up Information     Dale Bay[place], MD. Call on 01/14/2024.   Specialty: Internal Medicine Why: Follow up appointment is scheduled for 01/14/2024 @11am  bring current insurance card and a valid ID Arrive 15 mins early Contact information: [address] [place] [address] [phone]                Discharge Exam: Ceasar Mons Weights   12/31/23 2202  Weight: 68 kg   Physical  Exam HENT:     Head: Normocephalic.     Mouth/Throat:     Pharynx: No oropharyngeal exudate.  Eyes:     General: Lids are normal.     Conjunctiva/sclera: Conjunctivae normal.  Cardiovascular:     Rate and Rhythm: Normal rate and regular rhythm.     Heart sounds: Normal heart sounds, S1 normal and S2 normal.  Pulmonary:     Breath sounds: No decreased breath sounds, wheezing, rhonchi or rales.  Abdominal:     Palpations: Abdomen is soft.     Tenderness: There is no abdominal tenderness.  Musculoskeletal:     Right lower leg: No swelling.     Left lower leg: No swelling.  Skin:    General: Skin is warm.     Findings: No rash.  Neurological:     Mental Status: She is alert and oriented to person, place, and time.  Comments: Minimal tremor.      Condition at discharge: stable    Labs: CBC: Recent Labs  Lab 12/31/23 2214 01/02/24 0800 01/05/24 0508  WBC 8.1 4.2 5.2  HGB 14.8 13.5 13.1  HCT 44.4 39.5 38.9  MCV 87.7 87.6 89.0  PLT 282 178 196   Basic Metabolic Panel: Recent Labs  Lab 12/31/23 2214 01/02/24 0800 01/05/24 0508  NA 134* 139 137  K 3.8 4.1 4.3  CL 97* 106 102  CO2 20* 26 27  GLUCOSE 132* 105* 83  BUN 17 15 23*  CREATININE 0.63 0.73 0.73  CALCIUM 8.3* 8.6* 8.9  MG  --   --  2.1   Liver Function Tests: Recent Labs  Lab 12/31/23 2214 01/02/24 0800  AST 30 28  ALT 27 24  ALKPHOS 89 75  BILITOT 0.9 1.3*  PROT 7.6 6.7  ALBUMIN 4.5 3.6     Discharge time spent: greater than 30 minutes.  Signed: Alford Highland, MD [facility] 01/05/2024

## 2024-01-05 NOTE — Progress Notes (Signed)
Nsg Discharge Note  Admit Date:  12/31/2023 Discharge date: 01/05/2024   Tracy Phillips to be D/C'd Home  per MD order.  AVS completed.  Copy for chart, and copy for patient signed, and dated. Patient/caregiver able to verbalize understanding.  Discharge Medication: Allergies as of 01/05/2024       Reactions   Aleve [naproxen Sodium] Hives        Medication List     STOP taking these medications    cyclobenzaprine 5 MG tablet Commonly known as: FLEXERIL   ondansetron 4 MG disintegrating tablet Commonly known as: ZOFRAN-ODT       TAKE these medications    celecoxib 100 MG capsule Commonly known as: CELEBREX Take 1 capsule (100 mg total) by mouth daily. What changed: when to take this   citalopram 20 MG tablet Commonly known as: CELEXA Take 1 tablet (20 mg total) by mouth daily.   DULoxetine 30 MG capsule Commonly known as: CYMBALTA Take 3 capsules (90 mg total) by mouth daily.   folic acid 1 MG tablet Commonly known as: FOLVITE Take 1 tablet (1 mg total) by mouth daily. Start taking on: January 06, 2024   LORazepam 1 MG tablet Commonly known as: ATIVAN One tab po every six hours for two days, one tab po every eight hours for two days; one tab po every 12 hours for two days then one tab po daily for three days   methocarbamol 750 MG tablet Commonly known as: ROBAXIN Take 325-750 mg by mouth every 8 (eight) hours as needed.  1/2-1 po tid prn   multivitamin with minerals Tabs tablet Take 1 tablet by mouth daily. Start taking on: January 06, 2024   pantoprazole 40 MG tablet Commonly known as: PROTONIX Take 1 tablet (40 mg total) by mouth daily.   polyethylene glycol 17 g packet Commonly known as: MIRALAX / GLYCOLAX Take 17 g by mouth daily. Start taking on: January 06, 2024   promethazine 25 MG tablet Commonly known as: PHENERGAN Take 1 tablet (25 mg total) by mouth every 8 (eight) hours as needed for nausea or vomiting.   QUEtiapine 100 MG  tablet Commonly known as: SEROQUEL Take 1 tablet (100 mg total) by mouth at bedtime.   thiamine 100 MG tablet Commonly known as: Vitamin B-1 Take 1 tablet (100 mg total) by mouth daily. Start taking on: January 06, 2024        Discharge Assessment: Vitals:   01/05/24 0322 01/05/24 0747  BP: 117/64 133/80  Pulse: 77 81  Resp:    Temp:  97.6 F (36.4 C)  SpO2:  100%   Skin clean, dry and intact without evidence of skin break down, no evidence of skin tears noted. IV catheter discontinued intact. Site without signs and symptoms of complications - no redness or edema noted at insertion site, patient denies c/o pain - only slight tenderness at site.  Dressing with slight pressure applied.  D/c Instructions-Education: Discharge instructions given to patient/family with verbalized understanding. D/c education completed with patient/family including follow up instructions, medication list, d/c activities limitations if indicated, with other d/c instructions as indicated by MD - patient able to verbalize understanding, all questions fully answered. Patient instructed to return to ED, call 911, or call MD for any changes in condition.  Patient escorted via WC, and D/C home via private auto.  Adair Laundry, RN 01/05/2024 2:21 PM

## 2024-01-05 NOTE — Assessment & Plan Note (Signed)
Sodium 1 point less than the normal range on admission but normal upon discharge.

## 2024-01-05 NOTE — Telephone Encounter (Signed)
Rx ok'd for seroquel

## 2024-01-06 ENCOUNTER — Telehealth: Payer: Self-pay

## 2024-01-06 NOTE — Transitions of Care (Post Inpatient/ED Visit) (Unsigned)
   01/06/2024  Name: Tracy Phillips MRN: [id number] DOB: [birth year]  Today's TOC FU Call Status: Today's TOC FU Call Status:: Unsuccessful Call (1st Attempt) Unsuccessful Call (1st Attempt) Date: 01/06/24  Attempted to reach the patient regarding the most recent Inpatient/ED visit.  Follow Up Plan: Additional outreach attempts will be made to reach the patient to complete the Transitions of Care (Post Inpatient/ED visit) call.   Signature Karena Addison, LPN [facility] Nurse Health Advisor Direct Dial [phone]

## 2024-01-07 NOTE — Transitions of Care (Post Inpatient/ED Visit) (Unsigned)
   01/07/2024  Name: Tracy Phillips MRN: [id number] DOB: 07-Aug-1966  Today's TOC FU Call Status: Today's TOC FU Call Status:: Unsuccessful Call (2nd Attempt) Unsuccessful Call (1st Attempt) Date: 01/06/24 Unsuccessful Call (2nd Attempt) Date: 01/07/24  Attempted to reach the patient regarding the most recent Inpatient/ED visit.  Follow Up Plan: Additional outreach attempts will be made to reach the patient to complete the Transitions of Care (Post Inpatient/ED visit) call.   Signature Karena Addison, LPN [facility] Nurse Health Advisor Direct Dial [phone]

## 2024-01-08 NOTE — Transitions of Care (Post Inpatient/ED Visit) (Signed)
   01/08/2024  Name: Tracy Phillips MRN: [id number] DOB: 1966-09-09  Today's TOC FU Call Status: Today's TOC FU Call Status:: Unsuccessful Call (3rd Attempt) Unsuccessful Call (1st Attempt) Date: 01/06/24 Unsuccessful Call (2nd Attempt) Date: 01/07/24 Unsuccessful Call (3rd Attempt) Date: 01/08/24  Attempted to reach the patient regarding the most recent Inpatient/ED visit.  Follow Up Plan: No further outreach attempts will be made at this time. We have been unable to contact the patient.  Signature Karena Addison, LPN [facility] Nurse Health Advisor Direct Dial [phone]

## 2024-01-14 ENCOUNTER — Inpatient Hospital Stay: Payer: BC Managed Care – PPO | Admitting: Internal Medicine

## 2024-01-21 ENCOUNTER — Inpatient Hospital Stay: Payer: BC Managed Care – PPO | Admitting: Internal Medicine

## 2024-01-21 ENCOUNTER — Ambulatory Visit (INDEPENDENT_AMBULATORY_CARE_PROVIDER_SITE_OTHER): Admitting: Internal Medicine

## 2024-01-21 ENCOUNTER — Inpatient Hospital Stay: Admitting: Internal Medicine

## 2024-01-21 ENCOUNTER — Encounter: Payer: Self-pay | Admitting: Internal Medicine

## 2024-01-21 VITALS — BP 128/74 | HR 92 | Temp 98.0°F | Resp 16 | Ht 63.0 in | Wt 183.0 lb

## 2024-01-21 DIAGNOSIS — F332 Major depressive disorder, recurrent severe without psychotic features: Secondary | ICD-10-CM | POA: Diagnosis not present

## 2024-01-21 DIAGNOSIS — R739 Hyperglycemia, unspecified: Secondary | ICD-10-CM

## 2024-01-21 DIAGNOSIS — F411 Generalized anxiety disorder: Secondary | ICD-10-CM

## 2024-01-21 DIAGNOSIS — N200 Calculus of kidney: Secondary | ICD-10-CM

## 2024-01-21 DIAGNOSIS — K219 Gastro-esophageal reflux disease without esophagitis: Secondary | ICD-10-CM

## 2024-01-21 DIAGNOSIS — F1029 Alcohol dependence with unspecified alcohol-induced disorder: Secondary | ICD-10-CM

## 2024-01-21 DIAGNOSIS — E7800 Pure hypercholesterolemia, unspecified: Secondary | ICD-10-CM | POA: Diagnosis not present

## 2024-01-21 DIAGNOSIS — M25559 Pain in unspecified hip: Secondary | ICD-10-CM

## 2024-01-21 NOTE — Progress Notes (Signed)
 Subjective:    Patient ID: Tracy Phillips, female    DOB: 1966/11/08, 58 y.o.   MRN: [id number]  Patient here for  Chief Complaint  Patient presents with   Hospitalization Follow-up    HPI Admitted 12/31/23 - 01/05/24 - after presenting with mild chest pain. Had been on alcohol binge PTA.  Had worsening tremors and anxiety. Placed on phenobarbital taper for wihtdrawal protocol. Ativan taper - discharged to continue taper. Placed on IV protonix and changed to oral prior to discharge. Cardiac enzymes negative. Treated with phenergan and magnesium for headache and N/V.  She comes in today reporting that she has not had any further alcohol. Discussed treatment for alcohol dependency, including AA, IOP, etc. She does have contacts she feels comfortable calling and has reached out. Discussed getting back into AA. Denies any cocaine use or other drug use. She is off ativan now. Reports increased anxiety and was requesting a refill on ativan (or something like it) to help with increased anxiety and increased panic attacks. She does have an appt with psychiatry 02/09/24. Discussed recommendation to avoid benzodiazepines. She has tried hydroxyzine and this did not work for her. Is concerned regarding weight gain. Having hip issues. Injection did help. Planning scan to further evaluate. No chest pain. Breathing stable. Taking her medications regularly.    Past Medical History:  Diagnosis Date   Depression    H/O dizziness    H/O eating disorder    H/O: alcohol abuse    Herpes    H/O   History of chicken pox    History of colon polyps    Hyperlipidemia    Hypertension    Kidney stones    H/O stones   Past Surgical History:  Procedure Laterality Date   ABDOMINAL HYSTERECTOMY  1998   APPENDECTOMY  1997   DILATION AND CURETTAGE OF UTERUS     TONSILLECTOMY  1997   Family History  Problem Relation Age of Onset   Hyperlipidemia Mother    Hypertension Mother    Breast cancer Mother 98    Arthritis Father    Hyperlipidemia Father    Hypertension Father    Diabetes Father    Hypertension Brother    Arthritis Maternal Aunt    Hyperlipidemia Maternal Aunt    Hypertension Maternal Aunt    Alcohol abuse Maternal Uncle    Arthritis Maternal Uncle    Hyperlipidemia Maternal Uncle    Hypertension Maternal Uncle    Arthritis Paternal Aunt    Hyperlipidemia Paternal Aunt    Hypertension Paternal Aunt    Arthritis Paternal Uncle    Hyperlipidemia Paternal Uncle    Hypertension Paternal Uncle    Arthritis Maternal Grandmother    Hyperlipidemia Maternal Grandmother    Hypertension Maternal Grandmother    Arthritis Maternal Grandfather    Lung cancer Maternal Grandfather    Prostate cancer Maternal Grandfather    Hyperlipidemia Maternal Grandfather    Stroke Maternal Grandfather    Hypertension Maternal Grandfather    Arthritis Paternal Grandmother    Hyperlipidemia Paternal Grandmother    Hypertension Paternal Grandmother    Alcohol abuse Paternal Grandfather    Arthritis Paternal Grandfather    Hyperlipidemia Paternal Grandfather    Hypertension Paternal Grandfather    Sudden death Paternal Grandfather    Social History   Socioeconomic History   Marital status: Single    Spouse name: Not on file   Number of children: Not on file   Years of education: Not  on file   Highest education level: Not on file  Occupational History   Not on file  Tobacco Use   Smoking status: Never   Smokeless tobacco: Never  Vaping Use   Vaping status: Never Used  Substance and Sexual Activity   Alcohol use: No    Comment: sober since 10/27/2010   Drug use: Not Currently    Types: "Crack" cocaine   Sexual activity: Not Currently  Other Topics Concern   Not on file  Social History Narrative   Not on file   Social Drivers of Health   Financial Resource Strain: Medium Risk (11/04/2023)   Received from [facility]   Overall Financial Resource Strain (CARDIA)     Difficulty of Paying Living Expenses: Somewhat hard  Food Insecurity: No Food Insecurity (01/01/2024)   Hunger Vital Sign    Worried About Running Out of Food in the Last Year: Never true    Ran Out of Food in the Last Year: Never true  Transportation Needs: No Transportation Needs (01/01/2024)   PRAPARE - Administrator, Civil Service (Medical): No    Lack of Transportation (Non-Medical): No  Physical Activity: Inactive (09/25/2023)   Exercise Vital Sign    Days of Exercise per Week: 0 days    Minutes of Exercise per Session: 0 min  Stress: Stress Concern Present (09/25/2023)   [employer] of Occupational Health - Occupational Stress Questionnaire    Feeling of Stress : To some extent  Social Connections: Moderately Isolated (01/01/2024)   Social Connection and Isolation Panel [NHANES]    Frequency of Communication with Friends and Family: Once a week    Frequency of Social Gatherings with Friends and Family: Once a week    Attends Religious Services: More than 4 times per year    Active Member of [employer] or Organizations: Yes    Attends Engineer, structural: More than 4 times per year    Marital Status: Divorced     Review of Systems  Constitutional:  Negative for fever.       Concerned regarding weight gain.   HENT:  Negative for congestion and sinus pressure.   Respiratory:  Negative for cough, chest tightness and shortness of breath.   Cardiovascular:  Negative for chest pain, palpitations and leg swelling.  Gastrointestinal:  Negative for abdominal pain, diarrhea, nausea and vomiting.  Genitourinary:  Negative for difficulty urinating and dysuria.  Musculoskeletal:  Negative for back pain and myalgias.  Skin:  Negative for color change and rash.  Neurological:  Negative for dizziness and headaches.  Psychiatric/Behavioral:         Increased anxiety as outlined. No SI.        Objective:     BP 128/74   Pulse 92   Temp 98 F (36.7 C)   Resp  16   Ht 5\' 3"  (1.6 m)   Wt 183 lb (83 kg)   SpO2 98%   BMI 32.42 kg/m  Wt Readings from Last 3 Encounters:  01/21/24 183 lb (83 kg)  12/31/23 150 lb (68 kg)  10/23/23 158 lb 12.8 oz (72 kg)    Physical Exam Vitals reviewed.  Constitutional:      General: She is not in acute distress.    Appearance: Normal appearance.  HENT:     Head: Normocephalic and atraumatic.     Right Ear: External ear normal.     Left Ear: External ear normal.  Mouth/Throat:     Pharynx: No oropharyngeal exudate or posterior oropharyngeal erythema.  Eyes:     General: No scleral icterus.       Right eye: No discharge.        Left eye: No discharge.     Conjunctiva/sclera: Conjunctivae normal.  Neck:     Thyroid: No thyromegaly.  Cardiovascular:     Rate and Rhythm: Normal rate and regular rhythm.  Pulmonary:     Effort: No respiratory distress.     Breath sounds: Normal breath sounds. No wheezing.  Abdominal:     General: Bowel sounds are normal.     Palpations: Abdomen is soft.     Tenderness: There is no abdominal tenderness.  Musculoskeletal:        General: No swelling or tenderness.     Cervical back: Neck supple. No tenderness.  Lymphadenopathy:     Cervical: No cervical adenopathy.  Skin:    Findings: No erythema or rash.  Neurological:     Mental Status: She is alert.  Psychiatric:        Mood and Affect: Mood normal.        Behavior: Behavior normal.         Outpatient Encounter Medications as of 01/21/2024  Medication Sig   celecoxib (CELEBREX) 100 MG capsule Take 1 capsule (100 mg total) by mouth daily.   citalopram (CELEXA) 20 MG tablet Take 1 tablet (20 mg total) by mouth daily.   DULoxetine (CYMBALTA) 30 MG capsule Take 3 capsules (90 mg total) by mouth daily.   folic acid (FOLVITE) 1 MG tablet Take 1 tablet (1 mg total) by mouth daily.   methocarbamol (ROBAXIN) 750 MG tablet Take 325-750 mg by mouth every 8 (eight) hours as needed.  1/2-1 po tid prn   pantoprazole  (PROTONIX) 40 MG tablet Take 1 tablet (40 mg total) by mouth daily.   promethazine (PHENERGAN) 25 MG tablet Take 1 tablet (25 mg total) by mouth every 8 (eight) hours as needed for nausea or vomiting.   QUEtiapine (SEROQUEL) 100 MG tablet TAKE 1 TABLET BY MOUTH EVERYDAY AT BEDTIME   thiamine (VITAMIN B-1) 100 MG tablet Take 1 tablet (100 mg total) by mouth daily.   [DISCONTINUED] LORazepam (ATIVAN) 1 MG tablet One tab po every six hours for two days, one tab po every eight hours for two days; one tab po every 12 hours for two days then one tab po daily for three days   [DISCONTINUED] Multiple Vitamin (MULTIVITAMIN WITH MINERALS) TABS tablet Take 1 tablet by mouth daily.   [DISCONTINUED] polyethylene glycol (MIRALAX / GLYCOLAX) 17 g packet Take 17 g by mouth daily.   No facility-administered encounter medications on file as of 01/21/2024.     Lab Results  Component Value Date   WBC 5.2 01/05/2024   HGB 13.1 01/05/2024   HCT 38.9 01/05/2024   PLT 196 01/05/2024   GLUCOSE 83 01/05/2024   CHOL 214 (H) 09/12/2023   TRIG 153 (H) 09/12/2023   HDL 55 09/12/2023   LDLDIRECT 132.0 05/13/2019   LDLCALC 128 (H) 09/12/2023   ALT 24 01/02/2024   AST 28 01/02/2024   NA 137 01/05/2024   K 4.3 01/05/2024   CL 102 01/05/2024   CREATININE 0.73 01/05/2024   BUN 23 (H) 01/05/2024   CO2 27 01/05/2024   TSH 2.288 04/08/2023   HGBA1C 5.5 09/11/2023    DG Chest 2 View Result Date: 01/01/2024 CLINICAL DATA:  Chest pain EXAM: CHEST -  2 VIEW COMPARISON:  10/09/2023 FINDINGS: Normal heart size and mediastinal contours. No acute infiltrate or edema. No effusion or pneumothorax. No acute osseous findings. IMPRESSION: No active cardiopulmonary disease. Electronically Signed   By: Tiburcio Pea M.D.   On: 01/01/2024 04:54       Assessment & Plan:  MDD (major depressive disorder), recurrent severe, without psychosis (HCC) Assessment & Plan: Currently on Celexa, Cymbalta and Seroquel.  Off Ativan.  She is  having increased anxiety and is concerned regarding possible anxiety attacks.  She request to have something like Ativan to help with her anxiety.  We discussed the recommendation to avoid benzodiazepines.  She does have a follow-up scheduled with psychiatry 02/09/2024.  Will discuss with psychiatry regarding her medications, especially given that she is on multiple medications and has tried previous medications.  She has tried hydroxyzine.  Did not feel like this worked for her.  No SI.  She is agreeable with me contacting psychiatry regarding further treatment.   Kidney stones Assessment & Plan: Does report a history of kidney stones.  Will avoid Topamax at this time.  Of note her last kidney stone was at least 15 years ago.   Hyperglycemia Assessment & Plan: Low-carb diet and exercise.  Follow met b and A1c.   Hypercholesterolemia Assessment & Plan: Low-cholesterol diet and exercise.  Follow-up fasting lipid profile.   Hip pain, unspecified laterality Assessment & Plan: Has seen orthopedics.  Previous hip injection helped.  Planning scan.   Gastroesophageal reflux disease, unspecified whether esophagitis present Assessment & Plan: Continue Protonix.   GAD (generalized anxiety disorder) Assessment & Plan: She is having issues with increased anxiety.  Discussed medication regimen as outlined above.  Will follow-up with psychiatry as outlined.  Hold on prescribing benzodiazepines.   Alcohol dependence with unspecified alcohol-induced disorder [facility]) Assessment & Plan: She denies any alcohol intake since her hospitalization.  I discussed with her regarding the importance of following up as an outpatient.  Discussed AA as well as intensive outpatient therapy.  She does have friends that she can contact and has reached out.  Discussed the need for follow-up with AA or outpatient therapy.      Dale Creighton, MD

## 2024-01-21 NOTE — Progress Notes (Deleted)
 Patient ID: Tracy Phillips, female   DOB: 12/08/1965, 58 y.o.   MRN: [id number]   Virtual Visit via video Note  I connected withNAME@ today at 11:00 AM EST by a video enabled telemedicine application or telephone and verified that I am speaking with the correct person using two identifiers. Location patient: home Location provider: work or home office Persons participating in the virtual visit: patient, provider  I discussed the limitations, risks, security and privacy concerns of performing an evaluation and management service by telephone and the availability of in person appointments. I also discussed with the patient that there may be a patient responsible charge related to this service. The patient expressed understanding and agreed to proceed.  Interactive audio and video telecommunications were attempted between this provider and patient, however failed, due to patient having technical difficulties OR patient did not have access to video capability.  We continued and completed visit with audio only. ***  Reason for visit: ***  HPI: Admitted 12/31/23 - 01/05/24 - after presenting with mild chest pain. Had been on alcohol binge PTA.  Had worsening tremors and anxiety. Placed on phenobarbital taper for wihtdrawal protocol. Ativan taper - discharged to continue taper. Placed on IV protonix and changed to oral prior to discharge. Cardiac enzymes negative. Treated with phenergan and magnesium for headache and N/V.    ROS: See pertinent positives and negatives per HPI.  Past Medical History:  Diagnosis Date   Depression    H/O dizziness    H/O eating disorder    H/O: alcohol abuse    Herpes    H/O   History of chicken pox    History of colon polyps    Hyperlipidemia    Hypertension    Kidney stones    H/O stones    Past Surgical History:  Procedure Laterality Date   ABDOMINAL HYSTERECTOMY  1998   APPENDECTOMY  1997   DILATION AND CURETTAGE OF UTERUS     TONSILLECTOMY  1997     Family History  Problem Relation Age of Onset   Hyperlipidemia Mother    Hypertension Mother    Breast cancer Mother 52   Arthritis Father    Hyperlipidemia Father    Hypertension Father    Diabetes Father    Hypertension Brother    Arthritis Maternal Aunt    Hyperlipidemia Maternal Aunt    Hypertension Maternal Aunt    Alcohol abuse Maternal Uncle    Arthritis Maternal Uncle    Hyperlipidemia Maternal Uncle    Hypertension Maternal Uncle    Arthritis Paternal Aunt    Hyperlipidemia Paternal Aunt    Hypertension Paternal Aunt    Arthritis Paternal Uncle    Hyperlipidemia Paternal Uncle    Hypertension Paternal Uncle    Arthritis Maternal Grandmother    Hyperlipidemia Maternal Grandmother    Hypertension Maternal Grandmother    Arthritis Maternal Grandfather    Lung cancer Maternal Grandfather    Prostate cancer Maternal Grandfather    Hyperlipidemia Maternal Grandfather    Stroke Maternal Grandfather    Hypertension Maternal Grandfather    Arthritis Paternal Grandmother    Hyperlipidemia Paternal Grandmother    Hypertension Paternal Grandmother    Alcohol abuse Paternal Grandfather    Arthritis Paternal Grandfather    Hyperlipidemia Paternal Grandfather    Hypertension Paternal Grandfather    Sudden death Paternal Grandfather     SOCIAL HX: ***   Current Outpatient Medications:    celecoxib (CELEBREX) 100 MG capsule, Take 1  capsule (100 mg total) by mouth daily., Disp: , Rfl:    citalopram (CELEXA) 20 MG tablet, Take 1 tablet (20 mg total) by mouth daily., Disp: 30 tablet, Rfl: 2   DULoxetine (CYMBALTA) 30 MG capsule, Take 3 capsules (90 mg total) by mouth daily., Disp: 90 capsule, Rfl: 2   folic acid (FOLVITE) 1 MG tablet, Take 1 tablet (1 mg total) by mouth daily., Disp: 30 tablet, Rfl: 0   LORazepam (ATIVAN) 1 MG tablet, One tab po every six hours for two days, one tab po every eight hours for two days; one tab po every 12 hours for two days then one tab po  daily for three days, Disp: 21 tablet, Rfl: 0   methocarbamol (ROBAXIN) 750 MG tablet, Take 325-750 mg by mouth every 8 (eight) hours as needed.  1/2-1 po tid prn, Disp: , Rfl:    Multiple Vitamin (MULTIVITAMIN WITH MINERALS) TABS tablet, Take 1 tablet by mouth daily., Disp: , Rfl:    pantoprazole (PROTONIX) 40 MG tablet, Take 1 tablet (40 mg total) by mouth daily., Disp: 30 tablet, Rfl: 0   polyethylene glycol (MIRALAX / GLYCOLAX) 17 g packet, Take 17 g by mouth daily., Disp: 30 each, Rfl: 0   promethazine (PHENERGAN) 25 MG tablet, Take 1 tablet (25 mg total) by mouth every 8 (eight) hours as needed for nausea or vomiting., Disp: 30 tablet, Rfl: 0   QUEtiapine (SEROQUEL) 100 MG tablet, TAKE 1 TABLET BY MOUTH EVERYDAY AT BEDTIME, Disp: 30 tablet, Rfl: 0   thiamine (VITAMIN B-1) 100 MG tablet, Take 1 tablet (100 mg total) by mouth daily., Disp: 30 tablet, Rfl: 0  EXAM:  VITALS per patient if applicable:  GENERAL: alert, oriented, appears well and in no acute distress  HEENT: atraumatic, conjunttiva clear, no obvious abnormalities on inspection of external nose and ears  NECK: normal movements of the head and neck  LUNGS: on inspection no signs of respiratory distress, breathing rate appears normal, no obvious gross SOB, gasping or wheezing  CV: no obvious cyanosis  MS: moves all visible extremities without noticeable abnormality  PSYCH/NEURO: pleasant and cooperative, no obvious depression or anxiety, speech and thought processing grossly intact  ASSESSMENT AND PLAN:  Discussed the following assessment and plan:  Problem List Items Addressed This Visit   None   No follow-ups on file.   I discussed the assessment and treatment plan with the patient. The patient was provided an opportunity to ask questions and all were answered. The patient agreed with the plan and demonstrated an understanding of the instructions.   The patient was advised to call back or seek an in-person  evaluation if the symptoms worsen or if the condition fails to improve as anticipated.  I provided *** minutes of non-face-to-face time during this encounter.   Dale Brentwood, MD

## 2024-01-22 ENCOUNTER — Telehealth: Payer: Self-pay

## 2024-01-22 NOTE — Telephone Encounter (Signed)
Duplicate note. See other telephone encounter.  

## 2024-01-22 NOTE — Telephone Encounter (Signed)
 Copied from CRM [id number]. Topic: General - Other >> Jan 22, 2024 10:48 AM Almira Coaster wrote: Reason for CRM: Patient is calling to follow up one her appointment with Dr.Scott regarding her anxiety. Patient had another anxiety attack today and wants to know if Dr.Scott was able to speak with the other provider she mentioned regarding anxiety medication.

## 2024-01-22 NOTE — Telephone Encounter (Signed)
 I did speak with psychiatry. Need to know if she feels between celexa and cymbalta - if either works better. Also need to confirm if she has ever taken topamax. Any history of kidney stones?

## 2024-01-22 NOTE — Telephone Encounter (Signed)
 Copied from CRM [id number]. Topic: General - Other >> Jan 22, 2024 10:48 AM Almira Coaster wrote: Reason for CRM: Patient is calling to follow up one her appointment with Dr.Scott regarding her anxiety. Patient had another anxiety attack today and wants to know if Dr.Scott was able to speak with the other provider she mentioned regarding anxiety medication. >> Jan 22, 2024  3:51 PM Isabell A wrote: Patient is calling to follow up on this, requesting a call back.

## 2024-01-22 NOTE — Telephone Encounter (Signed)
 Have you talked with psychiatry?

## 2024-01-22 NOTE — Telephone Encounter (Signed)
 Copied from CRM [id number]. Topic: General - Other >> Jan 22, 2024  5:15 PM [place] J wrote: Reason for CRM: Patient wanted to let Dr. Lorin Picket & nurse know that she in fact does have history of kidney stones, but it has been over 15 years since she last had them.

## 2024-01-23 ENCOUNTER — Telehealth: Payer: Self-pay

## 2024-01-23 NOTE — Telephone Encounter (Signed)
 Copied from CRM [id number]. Topic: Clinical - Medication Question >> Jan 23, 2024  1:27 PM Melissa C wrote: Reason for CRM: patient would like a call back from a nurse in the office regarding a medication that she is waiting to be prescribed. Patient doesn't know the name of the medication, stated Dr. Lorin Picket was going to consult with another doctor and that is where she was going to get the name of the medication. Please advise with patient. Thank you

## 2024-01-23 NOTE — Telephone Encounter (Signed)
 I advised her you were waiting to hear from Dr. Elna Breslow and would make decision at that time  and we would call patient back. Patient stated that she had notified nurse in previous call that she had Kidney stones but she did not tell her it was over 15 years ago. Patient ask I report this to provider.

## 2024-01-23 NOTE — Telephone Encounter (Signed)
 This is the patient I was talking to you about.  Thanks

## 2024-01-23 NOTE — Telephone Encounter (Signed)
 FYI for you.

## 2024-01-23 NOTE — Telephone Encounter (Signed)
 Noted. See other message

## 2024-01-25 ENCOUNTER — Encounter: Payer: Self-pay | Admitting: Internal Medicine

## 2024-01-25 NOTE — Assessment & Plan Note (Signed)
 Currently on Celexa, Cymbalta and Seroquel.  Off Ativan.  She is having increased anxiety and is concerned regarding possible anxiety attacks.  She request to have something like Ativan to help with her anxiety.  We discussed the recommendation to avoid benzodiazepines.  She does have a follow-up scheduled with psychiatry 02/09/2024.  Will discuss with psychiatry regarding her medications, especially given that she is on multiple medications and has tried previous medications.  She has tried hydroxyzine.  Did not feel like this worked for her.  No SI.  She is agreeable with me contacting psychiatry regarding further treatment.

## 2024-01-25 NOTE — Assessment & Plan Note (Signed)
 Does report a history of kidney stones.  Will avoid Topamax at this time.  Of note her last kidney stone was at least 15 years ago.

## 2024-01-25 NOTE — Assessment & Plan Note (Signed)
 Low-carb diet and exercise.  Follow met b and A1c.

## 2024-01-25 NOTE — Assessment & Plan Note (Signed)
 She is having issues with increased anxiety.  Discussed medication regimen as outlined above.  Will follow-up with psychiatry as outlined.  Hold on prescribing benzodiazepines.

## 2024-01-25 NOTE — Assessment & Plan Note (Signed)
 Low-cholesterol diet and exercise.  Follow-up fasting lipid profile.

## 2024-01-25 NOTE — Assessment & Plan Note (Signed)
-

## 2024-01-25 NOTE — Assessment & Plan Note (Signed)
 She denies any alcohol intake since her hospitalization.  I discussed with her regarding the importance of following up as an outpatient.  Discussed AA as well as intensive outpatient therapy.  She does have friends that she can contact and has reached out.  Discussed the need for follow-up with AA or outpatient therapy.

## 2024-01-25 NOTE — Assessment & Plan Note (Signed)
 Has seen orthopedics.  Previous hip injection helped.  Planning scan.

## 2024-01-26 ENCOUNTER — Other Ambulatory Visit: Payer: Self-pay | Admitting: Internal Medicine

## 2024-01-26 ENCOUNTER — Ambulatory Visit
Admission: RE | Admit: 2024-01-26 | Discharge: 2024-01-26 | Disposition: A | Discharge status: Home / Self Care | Source: Ambulatory Visit | Attending: Family Medicine | Admitting: Family Medicine

## 2024-01-26 ENCOUNTER — Ambulatory Visit: Payer: Self-pay | Admitting: Internal Medicine

## 2024-01-26 DIAGNOSIS — S3210XA Unspecified fracture of sacrum, initial encounter for closed fracture: Secondary | ICD-10-CM

## 2024-01-26 DIAGNOSIS — M4848XA Fatigue fracture of vertebra, sacral and sacrococcygeal region, initial encounter for fracture: Secondary | ICD-10-CM | POA: Diagnosis not present

## 2024-01-26 NOTE — Telephone Encounter (Signed)
 Patient called in to follow up from appt on Wednesday with Dr. Lorin Picket in regards to anxiety. Patient states she is in limbo because she is not being prescribed medication until her Therapy appt in May, but needs something sooner and needs advice on calming anxiety. Patient states magnesium is conflicting with certain medications she is on. Patient would like call back from Dr. Lorin Picket today if possible, stating she has worsened to having panic attacks.   Copied from CRM [id number]. Topic: Clinical - Red Word Triage >> Jan 26, 2024 12:17 PM [place] wrote: Red Word that prompted transfer to Nurse Triage: patient has been waiting on a call from Dr Lorin Picket due to another medication from a psychiatry doctor being prescribed to her. She is experiencing frequent anxiety attacks and is unsure how to help them Additional Information  Commented on: MODERATE anxiety (e.g., persistent or frequent anxiety symptoms; interferes with sleep, school, or work)    Not recommending being seen but follow up with PCP asap, as patient was just in offic on Wednesday and heard back from office on Friday  Answer Assessment - Initial Assessment Questions 1. CONCERN: "Did anything happen that prompted you to call today?"      Patient is following up on consult with Psychiatry and also about anything she can have to help 2. ANXIETY SYMPTOMS: "Can you describe how you (your loved one; patient) have been feeling?" (e.g., tense, restless, panicky, anxious, keyed up, overwhelmed, sense of impending doom).      Inhibiting daily activities 3. ONSET: "How long have you been feeling this way?" (e.g., hours, days, weeks)     Ongoing  4. SEVERITY: "How would you rate the level of anxiety?" (e.g., 0 - 10; or mild, moderate, severe).     Moderate  5. FUNCTIONAL IMPAIRMENT: "How have these feelings affected your ability to do daily activities?" "Have you had more difficulty than usual doing your normal daily activities?" (e.g., getting better,  same, worse; self-care, school, work, interactions)     Yes 6. HISTORY: "Have you felt this way before?" "Have you ever been diagnosed with an anxiety problem in the past?" (e.g., generalized anxiety disorder, panic attacks, PTSD). If Yes, ask: "How was this problem treated?" (e.g., medicines, counseling, etc.)     No  8. TREATMENT:  "What has been done so far to treat this anxiety?" (e.g., medicines, relaxation strategies). "What has helped?"     N/a 9. TREATMENT - THERAPIST: "Do you have a counselor or therapist? Name?"     Therapy appt coming up May 24 12. OTHER SYMPTOMS: "Do you have any other symptoms?" (e.g., feeling depressed, trouble concentrating, trouble sleeping, trouble breathing, palpitations or fast heartbeat, chest pain, sweating, nausea, or diarrhea)       Anxiety attacks  Protocols used: Anxiety and Panic Attack-A-AH

## 2024-01-26 NOTE — Telephone Encounter (Signed)
 After discussion with psychiatry, confirm she is taking 30mg  cymbalta (3 tablets per day). If so, then increase to 4 tablets per day. Keep f/u with psych.

## 2024-01-26 NOTE — Telephone Encounter (Signed)
 See other note

## 2024-01-27 NOTE — Telephone Encounter (Signed)
 Pt very comfortable with above plan.  Discussed current symptoms.  No SI. Plans to keep appt with psychiatry.

## 2024-01-27 NOTE — Telephone Encounter (Signed)
 Late entry.  I called and spoke to Tracy Phillips (01/26/24 - 1730). Discussed her medications and current symptoms. She is having increased anxiety. After discussion, clarified - she has previously bene on higher doses of citalopram and cymbalta. The two together made her feel more anxious. She does feel the cymbalta helps her more than citalopram. Discussed increasing dose of cymbalta and decreasing citalopram. She is in agreement with this. (Previously - the two higher doses of medication were too much). She is going to decrease citalopram to 1/2 20mg  q day with plans to taper off.  Will increase cymbalta to 3 tablets per day. (She has only been taking two tablets per day).  Will call with update with plans then to change rx if tolerating.

## 2024-01-28 DIAGNOSIS — M1612 Unilateral primary osteoarthritis, left hip: Secondary | ICD-10-CM | POA: Diagnosis not present

## 2024-01-29 ENCOUNTER — Ambulatory Visit: Payer: BC Managed Care – PPO | Admitting: Internal Medicine

## 2024-02-02 ENCOUNTER — Other Ambulatory Visit: Payer: Self-pay | Admitting: Internal Medicine

## 2024-02-02 NOTE — Telephone Encounter (Signed)
 Copied from CRM [id number]. Topic: Clinical - Medication Refill >> Feb 02, 2024  2:41 PM Truddie Crumble wrote: Most Recent Primary Care Visit:  Provider: Dale Embarrass  Department: [facility]  Visit Type: HOSPITAL FOLLOW UP  Date: 01/21/2024  Medication: citalopram (CELEXA) 20 MG tablet (new script need to be for 10mg ), DULoxetine (CYMBALTA) 30 MG capsule (amount was changed on how she takes) and pantoprazole (PROTONIX) 40 MG tablet  Has the patient contacted their pharmacy? Yes (Agent: If no, request that the patient contact the pharmacy for the refill. If patient does not wish to contact the pharmacy document the reason why and proceed with request.) (Agent: If yes, when and what did the pharmacy advise?)  Is this the correct pharmacy for this prescription? Yes If no, delete pharmacy and type the correct one.  This is the patient's preferred pharmacy:  [id number] [place], [place] - [address] [place] [address] Phone: [phone] Fax: [phone]   Has the prescription been filled recently? No  Is the patient out of the medication? Yes  Has the patient been seen for an appointment in the last year OR does the patient have an upcoming appointment? Yes  Can we respond through MyChart? Yes  Agent: Please be advised that Rx refills may take up to 3 business days. We ask that you follow-up with your pharmacy.

## 2024-02-02 NOTE — Telephone Encounter (Signed)
 Last Fill: Citalopram: 09/13/23     Cymbalta: 09/13/23     Protonix: 01/05/24  Last OV: 01/21/24 Next OV: None Scheduled  Routing to provider for review/authorization.

## 2024-02-03 NOTE — Telephone Encounter (Signed)
 Please call and confirm exactly how she is taking citalopram and cymbalta. We had discussed decreasing citalopram and increasing cymbalta. Also, please confirm when her psychiatry appt is scheduled.

## 2024-02-04 MED ORDER — DULOXETINE HCL 30 MG PO CPEP: 90.0000 mg | ORAL_CAPSULE | Freq: Every day | ORAL | 0 refills | Status: DC

## 2024-02-04 MED ORDER — PANTOPRAZOLE SODIUM 40 MG PO TBEC: 40.0000 mg | DELAYED_RELEASE_TABLET | Freq: Every day | ORAL | 0 refills | Status: DC

## 2024-02-04 MED ORDER — CITALOPRAM HYDROBROMIDE 10 MG PO TABS: 10.0000 mg | ORAL_TABLET | Freq: Every day | ORAL | 0 refills | Status: DC

## 2024-02-04 NOTE — Telephone Encounter (Signed)
 noted

## 2024-02-04 NOTE — Telephone Encounter (Signed)
 I have sent in rx (so that she will have medication to get her to her appt) for citalopram, cymbalta and protonix. She is to keep appt with Dr Elna Breslow. Further adjustments in her medication to be determined by Dr Elna Breslow.

## 2024-02-06 ENCOUNTER — Other Ambulatory Visit: Payer: Self-pay | Admitting: Internal Medicine

## 2024-02-06 DIAGNOSIS — M1612 Unilateral primary osteoarthritis, left hip: Secondary | ICD-10-CM | POA: Diagnosis not present

## 2024-02-07 NOTE — Telephone Encounter (Signed)
 Please notify pharmacy, I sent in 30 day prescription only. They had requested 90 day. She is due to see psychiatry soon and they may adjust her medication.

## 2024-02-09 ENCOUNTER — Other Ambulatory Visit: Payer: Self-pay

## 2024-02-09 ENCOUNTER — Ambulatory Visit (INDEPENDENT_AMBULATORY_CARE_PROVIDER_SITE_OTHER): Payer: Self-pay | Admitting: Psychiatry

## 2024-02-09 ENCOUNTER — Encounter: Payer: Self-pay | Admitting: Psychiatry

## 2024-02-09 VITALS — BP 145/85 | HR 91 | Temp 96.9°F | Ht 63.0 in | Wt 185.4 lb

## 2024-02-09 DIAGNOSIS — F1021 Alcohol dependence, in remission: Secondary | ICD-10-CM | POA: Diagnosis not present

## 2024-02-09 DIAGNOSIS — F331 Major depressive disorder, recurrent, moderate: Secondary | ICD-10-CM

## 2024-02-09 DIAGNOSIS — F1491 Cocaine use, unspecified, in remission: Secondary | ICD-10-CM

## 2024-02-09 DIAGNOSIS — Z79899 Other long term (current) drug therapy: Secondary | ICD-10-CM

## 2024-02-09 DIAGNOSIS — F411 Generalized anxiety disorder: Secondary | ICD-10-CM | POA: Diagnosis not present

## 2024-02-09 MED ORDER — BUSPIRONE HCL 10 MG PO TABS
10.0000 mg | ORAL_TABLET | Freq: Two times a day (BID) | ORAL | 1 refills | Status: DC
Start: 2024-02-09 — End: 2024-03-08

## 2024-02-09 MED ORDER — CITALOPRAM HYDROBROMIDE 10 MG PO TABS: 5.0000 mg | ORAL_TABLET | Freq: Every day | ORAL | Status: DC
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
## 2024-02-09 NOTE — Patient Instructions (Addendum)
 [email or web address]  [email or web address]  [email or web address] [facility] [address], [place], [place] [address]   [phone]  [facility] [email or web address] [address] [place], [place] [address]  [phone]  [facility], [facility] [email or web address] [address] [place], [place] [address]  [phone]   [facility] - [phone]  [facility] - [phone]  [facility] - [phone] counseling [phone]  [facility] [phone]   [email or web address] this clinician can offer telehealth and has a sliding scale option  [email or web address] this group also offers sliding scale rates and is based out of [place]   [employer] [facility] has interns who offer sliding scale rates and some of the full time clinicians do, as well. You complete their contact form on their website and the referrals coordinator will help to get connected to someone   [email or web address] [phone]   Buspirone Tablets What is this medication? BUSPIRONE (byoo SPYE rone) treats anxiety. It works by balancing the levels of dopamine and serotonin in your brain, substances that help regulate mood. This medicine may be used for other purposes; ask your health care provider or pharmacist if you have questions. COMMON BRAND NAME(S): BuSpar, Buspar Dividose What should I tell my care team before I take this medication? They need to know if you have any of these conditions: Kidney or liver disease An unusual or allergic reaction to buspirone, other medications, foods, dyes, or preservatives Pregnant or trying to get pregnant Breast-feeding How should I use this medication? Take this medication by mouth with a glass of water. Follow the  directions on the prescription label. You may take this medication with or without food. To ensure that this medication always works the same way for you, you should take it either always with or always without food. Take your doses at regular intervals. Do not take your medication more often than directed. Do not stop taking except on the advice of your care team. Talk to your care team about the use of this medication in children. Special care may be needed. Overdosage: If you think you have taken too much of this medicine contact a poison control center or emergency room at once. NOTE: This medicine is only for you. Do not share this medicine with others. What if I miss a dose? If you miss a dose, take it as soon as you can. If it is almost time for your next dose, take only that dose. Do not take double or extra doses. What may interact with this medication? Do not take this medication with any of the following: Linezolid MAOIs like Carbex, Eldepryl, Marplan, Nardil, and Parnate Methylene blue Procarbazine This medication may also interact with the following: Diazepam Digoxin Diltiazem Erythromycin Grapefruit juice Haloperidol Medications for mental depression or mood problems Medications for seizures like carbamazepine, phenobarbital and phenytoin Nefazodone Other medications for anxiety Rifampin Ritonavir Some antifungal medications like itraconazole, ketoconazole, and voriconazole Verapamil Warfarin This list may not describe all possible interactions. Give your health care provider a list of all the medicines, herbs, non-prescription drugs, or dietary supplements you use. Also tell them if you smoke, drink alcohol, or use illegal drugs. Some items may interact with your medicine. What should I watch for while using this medication? Visit your care team for regular checks on your progress. It may take 1 to 2 weeks before your anxiety gets better.  This medication may affect your  coordination, reaction time, or judgment. Do not drive or operate machinery until you know how this medication affects you. Sit up or stand slowly to reduce the risk of dizzy or fainting spells. Drinking alcohol with this medication can increase the risk of these side effects. What side effects may I notice from receiving this medication? Side effects that you should report to your care team as soon as possible: Allergic reactions--skin rash, itching, hives, swelling of the face, lips, tongue, or throat Irritability, confusion, fast or irregular heartbeat, muscle stiffness, twitching muscles, sweating, high fever, seizure, chills, vomiting, diarrhea, which may be signs of serotonin syndrome Side effects that usually do not require medical attention (report to your care team if they continue or are bothersome): Anxiety, nervousness Dizziness Drowsiness Headache Nausea Trouble sleeping This list may not describe all possible side effects. Call your doctor for medical advice about side effects. You may report side effects to FDA at 1-800-FDA-1088. Where should I keep my medication? Keep out of the reach of children. Store at room temperature below 30 degrees C (86 degrees F). Protect from light. Keep container tightly closed. Throw away any unused medication after the expiration date. NOTE: This sheet is a summary. It may not cover all possible information. If you have questions about this medicine, talk to your doctor, pharmacist, or health care provider.  2024 Elsevier/Gold Standard (2022-05-27 00:00:00)

## 2024-02-12 DIAGNOSIS — Z79899 Other long term (current) drug therapy: Secondary | ICD-10-CM | POA: Diagnosis not present

## 2024-02-13 LAB — HEPATIC FUNCTION PANEL
ALT: 16 IU/L (ref 0–32)
AST: 15 IU/L (ref 0–40)
Albumin: 4.5 g/dL (ref 3.8–4.9)
Alkaline Phosphatase: 98 IU/L (ref 44–121)
Bilirubin Total: 0.2 mg/dL (ref 0.0–1.2)
Bilirubin, Direct: <0.08 mg/dL (ref 0.00–0.40)
Total Protein: 7 g/dL (ref 6.0–8.5)

## 2024-02-24 ENCOUNTER — Other Ambulatory Visit: Payer: Self-pay | Admitting: Orthopedic Surgery

## 2024-02-24 ENCOUNTER — Encounter: Payer: Self-pay | Admitting: Internal Medicine

## 2024-02-24 ENCOUNTER — Ambulatory Visit: Admitting: Internal Medicine

## 2024-02-24 VITALS — BP 130/80 | HR 83 | Temp 98.0°F | Resp 16 | Ht 63.0 in | Wt 191.4 lb

## 2024-02-24 DIAGNOSIS — F332 Major depressive disorder, recurrent severe without psychotic features: Secondary | ICD-10-CM | POA: Diagnosis not present

## 2024-02-24 DIAGNOSIS — Z01818 Encounter for other preprocedural examination: Secondary | ICD-10-CM | POA: Diagnosis not present

## 2024-02-24 DIAGNOSIS — F419 Anxiety disorder, unspecified: Secondary | ICD-10-CM

## 2024-02-24 DIAGNOSIS — K219 Gastro-esophageal reflux disease without esophagitis: Secondary | ICD-10-CM

## 2024-02-24 DIAGNOSIS — F1491 Cocaine use, unspecified, in remission: Secondary | ICD-10-CM

## 2024-02-24 DIAGNOSIS — E7800 Pure hypercholesterolemia, unspecified: Secondary | ICD-10-CM

## 2024-02-24 DIAGNOSIS — R739 Hyperglycemia, unspecified: Secondary | ICD-10-CM

## 2024-02-24 DIAGNOSIS — M25552 Pain in left hip: Secondary | ICD-10-CM

## 2024-02-24 DIAGNOSIS — F1029 Alcohol dependence with unspecified alcohol-induced disorder: Secondary | ICD-10-CM

## 2024-02-24 LAB — CBC WITH DIFFERENTIAL/PLATELET
Basophils Absolute: 0.1 10*3/uL (ref 0.0–0.1)
Basophils Relative: 1.6 % (ref 0.0–3.0)
Eosinophils Absolute: 0.2 10*3/uL (ref 0.0–0.7)
Eosinophils Relative: 5.1 % — ABNORMAL HIGH (ref 0.0–5.0)
HCT: 42 % (ref 36.0–46.0)
Hemoglobin: 14.1 g/dL (ref 12.0–15.0)
Lymphocytes Relative: 41 % (ref 12.0–46.0)
Lymphs Abs: 1.9 10*3/uL (ref 0.7–4.0)
MCHC: 33.5 g/dL (ref 30.0–36.0)
MCV: 89.8 fl (ref 78.0–100.0)
Monocytes Absolute: 0.4 10*3/uL (ref 0.1–1.0)
Monocytes Relative: 9.8 % (ref 3.0–12.0)
Neutro Abs: 1.9 10*3/uL (ref 1.4–7.7)
Neutrophils Relative %: 42.5 % — ABNORMAL LOW (ref 43.0–77.0)
Platelets: 245 10*3/uL (ref 150.0–400.0)
RBC: 4.68 Mil/uL (ref 3.87–5.11)
RDW: 14.8 % (ref 11.5–15.5)
WBC: 4.6 10*3/uL (ref 4.0–10.5)

## 2024-02-24 LAB — BASIC METABOLIC PANEL WITH GFR
BUN: 16 mg/dL (ref 6–23)
CO2: 24 meq/L (ref 19–32)
Calcium: 9.6 mg/dL (ref 8.4–10.5)
Chloride: 104 meq/L (ref 96–112)
Creatinine, Ser: 0.86 mg/dL (ref 0.40–1.20)
GFR: 76.05 mL/min (ref >60.00)
Glucose, Bld: 119 mg/dL — ABNORMAL HIGH (ref 70–99)
Potassium: 4.2 meq/L (ref 3.5–5.1)
Sodium: 139 meq/L (ref 135–145)

## 2024-02-24 LAB — TSH: TSH: 3.79 u[IU]/mL (ref 0.35–5.50)

## 2024-02-24 LAB — HEMOGLOBIN A1C: Hgb A1c MFr Bld: 5.9 % (ref 4.6–6.5)

## 2024-02-24 NOTE — Progress Notes (Signed)
 Subjective:    Patient ID: Tracy Phillips, female    DOB: 13-Aug-1966, 58 y.o.   MRN: [id number]  Patient here for  Chief Complaint  Patient presents with   Pre-op Exam    HPI Here for a pre op evaluation. 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 Recommended left total hip replacement.  Was admitted 08/2023 - with chest pain - after cocaine use. Mild troponin elevation secondary to demand ischemia. ECHO unremarkable. Reports no cocaine use now. No alcohol use now. Seeing psychiatry now. Medications being adjusted. She is in touch with AA contacts. Overall she feels she is doing better - just the hip pain. Denies chest pain or increased sob. She is limited in activity given her hip. No abdominal pain. Some minimal constipation. She is eating prunes, increased bran and increasing water intake.    Past Medical History:  Diagnosis Date   Depression    H/O dizziness    H/O eating disorder    H/O: alcohol abuse    Herpes    H/O   History of chicken pox    History of colon polyps    Hyperlipidemia    Hypertension    Kidney stones    H/O stones   Past Surgical History:  Procedure Laterality Date   ABDOMINAL HYSTERECTOMY  1998   APPENDECTOMY  1997   DILATION AND CURETTAGE OF UTERUS     TONSILLECTOMY  1997   Family History  Problem Relation Age of Onset   Depression Mother    Hyperlipidemia Mother    Hypertension Mother    Breast cancer Mother 53   Dementia Father    Arthritis Father    Hyperlipidemia Father    Hypertension Father    Diabetes Father    Depression Brother    Hypertension Brother    Depression Brother    Depression Maternal Aunt    Arthritis Maternal Aunt    Hyperlipidemia Maternal Aunt    Hypertension Maternal Aunt    Arthritis Paternal Aunt    Hyperlipidemia Paternal Aunt    Hypertension Paternal Aunt    Alcohol abuse Maternal Uncle    Arthritis Maternal Uncle    Hyperlipidemia Maternal Uncle    Hypertension  Maternal Uncle    Arthritis Paternal Uncle    Hyperlipidemia Paternal Uncle    Hypertension Paternal Uncle    Arthritis Maternal Grandfather    Lung cancer Maternal Grandfather    Prostate cancer Maternal Grandfather    Hyperlipidemia Maternal Grandfather    Stroke Maternal Grandfather    Hypertension Maternal Grandfather    Arthritis Maternal Grandmother    Hyperlipidemia Maternal Grandmother    Hypertension Maternal Grandmother    Alcohol abuse Paternal Grandfather    Arthritis Paternal Grandfather    Hyperlipidemia Paternal Grandfather    Hypertension Paternal Grandfather    Sudden death Paternal Grandfather    Arthritis Paternal Grandmother    Hyperlipidemia Paternal Grandmother    Hypertension Paternal Grandmother    Social History   Socioeconomic History   Marital status: Divorced    Spouse name: Not on file   Number of children: 3   Years of education: Not on file   Highest education level: Bachelor's degree (e.g., BA, AB, BS)  Occupational History   Not on file  Tobacco Use   Smoking status: Never   Smokeless tobacco: Never  Vaping Use   Vaping status: Never Used  Substance and Sexual Activity   Alcohol use:  No    Comment: sober since 10/27/2010   Drug use: Not Currently    Types: "Crack" cocaine   Sexual activity: Not Currently  Other Topics Concern   Not on file  Social History Narrative   Not on file   Social Drivers of Health   Financial Resource Strain: Medium Risk (02/20/2024)   Overall Financial Resource Strain (CARDIA)    Difficulty of Paying Living Expenses: Somewhat hard  Food Insecurity: Food Insecurity Present (02/20/2024)   Hunger Vital Sign    Worried About Running Out of Food in the Last Year: Sometimes true    Ran Out of Food in the Last Year: Never true  Transportation Needs: No Transportation Needs (02/20/2024)   PRAPARE - Administrator, Civil Service (Medical): No    Lack of Transportation (Non-Medical): No  Physical  Activity: Unknown (02/20/2024)   Exercise Vital Sign    Days of Exercise per Week: Patient declined    Minutes of Exercise per Session: 0 min  Stress: Stress Concern Present (02/20/2024)   [employer] of Occupational Health - Occupational Stress Questionnaire    Feeling of Stress : Very much  Social Connections: Moderately Integrated (02/20/2024)   Social Connection and Isolation Panel [NHANES]    Frequency of Communication with Friends and Family: More than three times a week    Frequency of Social Gatherings with Friends and Family: Once a week    Attends Religious Services: More than 4 times per year    Active Member of [employer] or Organizations: Yes    Attends Engineer, structural: More than 4 times per year    Marital Status: Divorced  Recent Concern: Social Connections - Moderately Isolated (01/01/2024)   Social Connection and Isolation Panel [NHANES]    Frequency of Communication with Friends and Family: Once a week    Frequency of Social Gatherings with Friends and Family: Once a week    Attends Religious Services: More than 4 times per year    Active Member of [employer] or Organizations: Yes    Attends Engineer, structural: More than 4 times per year    Marital Status: Divorced     Review of Systems  Constitutional:  Negative for appetite change and unexpected weight change.  HENT:  Negative for congestion and sinus pressure.   Respiratory:  Negative for cough, chest tightness and shortness of breath.   Cardiovascular:  Negative for chest pain, palpitations and leg swelling.  Gastrointestinal:  Negative for abdominal pain, diarrhea, nausea and vomiting.  Genitourinary:  Negative for difficulty urinating and dysuria.  Musculoskeletal:  Negative for myalgias.       Hip pain as outlined.   Skin:  Negative for color change and rash.  Neurological:  Negative for dizziness and headaches.  Psychiatric/Behavioral:  Negative for agitation and dysphoric mood.         Objective:     BP 130/80   Pulse 83   Temp 98 F (36.7 C)   Resp 16   Ht 5\' 3"  (1.6 m)   Wt 191 lb 6.4 oz (86.8 kg)   SpO2 98%   BMI 33.90 kg/m  Wt Readings from Last 3 Encounters:  02/24/24 191 lb 6.4 oz (86.8 kg)  01/21/24 183 lb (83 kg)  12/31/23 150 lb (68 kg)    Physical Exam Vitals reviewed.  Constitutional:      General: She is not in acute distress.    Appearance: Normal appearance.  HENT:  Head: Normocephalic and atraumatic.     Right Ear: External ear normal.     Left Ear: External ear normal.     Mouth/Throat:     Pharynx: No oropharyngeal exudate or posterior oropharyngeal erythema.  Eyes:     General: No scleral icterus.       Right eye: No discharge.        Left eye: No discharge.     Conjunctiva/sclera: Conjunctivae normal.  Neck:     Thyroid: No thyromegaly.  Cardiovascular:     Rate and Rhythm: Normal rate and regular rhythm.  Pulmonary:     Effort: No respiratory distress.     Breath sounds: Normal breath sounds. No wheezing.  Abdominal:     General: Bowel sounds are normal.     Palpations: Abdomen is soft.     Tenderness: There is no abdominal tenderness.  Musculoskeletal:        General: No swelling or tenderness.     Cervical back: Neck supple. No tenderness.  Lymphadenopathy:     Cervical: No cervical adenopathy.  Skin:    Findings: No erythema or rash.  Neurological:     Mental Status: She is alert.  Psychiatric:        Mood and Affect: Mood normal.        Behavior: Behavior normal.         Outpatient Encounter Medications as of 02/24/2024  Medication Sig   busPIRone (BUSPAR) 10 MG tablet Take 1 tablet (10 mg total) by mouth 2 (two) times daily.   celecoxib (CELEBREX) 100 MG capsule Take 1 capsule (100 mg total) by mouth daily.   DULoxetine (CYMBALTA) 30 MG capsule Take 3 capsules (90 mg total) by mouth daily.   HYDROcodone-acetaminophen (NORCO/VICODIN) 5-325 MG tablet Take by mouth 2 (two) times daily.   methocarbamol  (ROBAXIN) 750 MG tablet Take 325-750 mg by mouth every 8 (eight) hours as needed.  1/2-1 po tid prn   pantoprazole (PROTONIX) 40 MG tablet Take 1 tablet (40 mg total) by mouth daily.   promethazine (PHENERGAN) 25 MG tablet Take 1 tablet (25 mg total) by mouth every 8 (eight) hours as needed for nausea or vomiting. (Patient taking differently: Take 12.5 mg by mouth every 8 (eight) hours as needed for nausea or vomiting.)   QUEtiapine (SEROQUEL) 100 MG tablet TAKE 1 TABLET BY MOUTH EVERYDAY AT BEDTIME   [DISCONTINUED] citalopram (CELEXA) 10 MG tablet Take 0.5 tablets (5 mg total) by mouth daily for 10 days.   [DISCONTINUED] folic acid (FOLVITE) 1 MG tablet Take 1 tablet (1 mg total) by mouth daily.   No facility-administered encounter medications on file as of 02/24/2024.     Lab Results  Component Value Date   WBC 4.6 02/24/2024   HGB 14.1 02/24/2024   HCT 42.0 02/24/2024   PLT 245.0 02/24/2024   GLUCOSE 119 (H) 02/24/2024   CHOL 214 (H) 09/12/2023   TRIG 153 (H) 09/12/2023   HDL 55 09/12/2023   LDLDIRECT 132.0 05/13/2019   LDLCALC 128 (H) 09/12/2023   ALT 16 02/12/2024   AST 15 02/12/2024   NA 139 02/24/2024   K 4.2 02/24/2024   CL 104 02/24/2024   CREATININE 0.86 02/24/2024   BUN 16 02/24/2024   CO2 24 02/24/2024   TSH 3.79 02/24/2024   HGBA1C 5.9 02/24/2024    CT PELVIS WO CONTRAST Result Date: 01/26/2024 CLINICAL DATA:  Left sacral ala fracture follow-up. EXAM: CT PELVIS WITHOUT CONTRAST TECHNIQUE: Multidetector CT imaging of  the pelvis was performed following the standard protocol without intravenous contrast. RADIATION DOSE REDUCTION: This exam was performed according to the departmental dose-optimization program which includes automated exposure control, adjustment of the mA and/or kV according to patient size and/or use of iterative reconstruction technique. COMPARISON:  MRI lumbar spine dated December 16, 2023. FINDINGS: Urinary Tract:  No abnormality visualized. Bowel:  Unremarkable visualized pelvic bowel loops. Prior appendectomy. Vascular/Lymphatic: No pathologically enlarged lymph nodes. No significant vascular abnormality seen. Reproductive:  Prior hysterectomy.  No adnexal mass. Other:  None. Musculoskeletal: Asymmetric sclerosis in the left sacral ala corresponding to subacute to chronic insufficiency fracture. 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 IMPRESSION: 1. Subacute to chronic insufficiency fracture of the left sacral ala. 2. 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 Electronically Signed   By: Aleta Anda M.D.   On: 01/26/2024 14:50       Assessment & Plan:  Hypercholesterolemia Assessment & Plan: Low cholesterol diet and exercise. Follow lipid panel.   Orders: -     CBC with Differential/Platelet -     Basic metabolic panel with GFR -     TSH  Hyperglycemia Assessment & Plan: Low carb diet and exercise. Follow met b and A1c.   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 Recommended left total hip replacement. Here for pre op evaluation.  Was admitted 08/2023 - with chest pain - after cocaine use. Mild troponin elevation secondary to demand ischemia. ECHO unremarkable. No chest pain or sob reported. Is limited in her activity with her hip pain. EKG - reviewed. Given above history, discussed need for cardiology to evaluate and give pre op/peri op recs.   Orders: -     EKG 12-Lead -     Ambulatory referral to Cardiology  MDD (major depressive disorder), recurrent severe, without psychosis (HCC) Assessment & Plan: Seeing psychiatry now. Medication being adjusted. Currently on seroquel, cymbalta and buspar. Off citalopram.  Feeling better. Follow. Continue f/u with psychiatry.    History of cocaine use Assessment & Plan: Denies using cocaine now.  Seeing psychiatry now.  Doing better.  Follow.    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 Recommended left total hip replacement.   Gastroesophageal reflux disease, unspecified whether esophagitis present Assessment & Plan: Continue protonix.    Anxiety and depression Assessment & Plan: Seeing psychiatry as outlined. Medication adjustments as outlined. Recently started on buspar.    Alcohol dependence with unspecified alcohol-induced disorder [facility]) Assessment & Plan: She denies any alcohol intake now. Seeing psychiatry. Has contacts from previous AA. Follow. Discussed the need to continue to abstain.       Dellar Fenton, MD

## 2024-02-28 ENCOUNTER — Other Ambulatory Visit: Payer: Self-pay | Admitting: Internal Medicine

## 2024-02-29 ENCOUNTER — Encounter: Payer: Self-pay | Admitting: Internal Medicine

## 2024-02-29 DIAGNOSIS — Z01818 Encounter for other preprocedural examination: Secondary | ICD-10-CM | POA: Insufficient documentation

## 2024-02-29 NOTE — Assessment & Plan Note (Addendum)
 She denies any alcohol intake now. Seeing psychiatry. Has contacts from previous AA. Follow. Discussed the need to continue to abstain.

## 2024-02-29 NOTE — Assessment & Plan Note (Signed)
 Seeing psychiatry as outlined. Medication adjustments as outlined. Recently started on buspar.

## 2024-02-29 NOTE — Assessment & Plan Note (Signed)
 Continue protonix

## 2024-02-29 NOTE — Assessment & Plan Note (Signed)
 Denies using cocaine now.  Seeing psychiatry now.  Doing better.  Follow.

## 2024-02-29 NOTE — Assessment & Plan Note (Signed)
 Seeing psychiatry now. Medication being adjusted. Currently on seroquel, cymbalta and buspar. Off citalopram. Feeling better. Follow. Continue f/u with psychiatry.

## 2024-02-29 NOTE — Assessment & Plan Note (Signed)
 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 Recommended left total hip replacement. Here for pre op evaluation.  Was admitted 08/2023 - with chest pain - after cocaine use. Mild troponin elevation secondary to demand ischemia. ECHO unremarkable. No chest pain or sob reported. Is limited in her activity with her hip pain. EKG - reviewed. Given above history, discussed need for cardiology to evaluate and give pre op/peri op recs.

## 2024-02-29 NOTE — Assessment & Plan Note (Signed)
 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 Recommended left total hip replacement.

## 2024-02-29 NOTE — Assessment & Plan Note (Signed)
 Low cholesterol diet and exercise.  Follow lipid panel.

## 2024-02-29 NOTE — Assessment & Plan Note (Signed)
 Low-carb diet and exercise.  Follow met b and A1c.

## 2024-03-03 ENCOUNTER — Telehealth: Payer: Self-pay | Admitting: Internal Medicine

## 2024-03-03 ENCOUNTER — Other Ambulatory Visit: Payer: Self-pay | Admitting: Internal Medicine

## 2024-03-03 NOTE — Telephone Encounter (Signed)
 She is now seeing psychiatry. Please notify pt and pharmacy that refills need to go through psychiatry. Let me know if a problem.

## 2024-03-03 NOTE — Telephone Encounter (Unsigned)
 Copied from CRM [id number]. Topic: Referral - Prior Authorization Question >> Mar 03, 2024 11:57 AM Danae Duncans wrote: Reason for CRM: Pt state she would like a nurse to callback in regards to pre approval for hip surgery for cardiology

## 2024-03-04 ENCOUNTER — Telehealth: Payer: Self-pay

## 2024-03-04 ENCOUNTER — Telehealth: Payer: Self-pay | Admitting: Internal Medicine

## 2024-03-04 NOTE — Telephone Encounter (Signed)
 Called patient. She says they are trying to schedule her for late May at [facility] and her surgery is scheduled for 5/1. Do you want me to see if we can get sooner appt or place new referral to another cardiologist?

## 2024-03-04 NOTE — Telephone Encounter (Signed)
 Called and spoke to [facility]. Unable to see pt for pre op cardiology clearance before the end of May. Pt wants to be able to go ahead and proceed with her surgery planned for 03/18/24. [facility] cardiology. They are able to see her 03/08/24 at 2:30. I called and left her a message regarding the appt. Please call and confirm that she received the appt date and time and is able to go.  Also, please send a copy of the pre op form, my office note and her EKG. Thanks

## 2024-03-04 NOTE — Telephone Encounter (Signed)
 See other note

## 2024-03-04 NOTE — Telephone Encounter (Signed)
 Copied from CRM [id number]. Topic: Referral - Prior Authorization Question >> Mar 03, 2024 11:57 AM Tracy Phillips wrote: Reason for CRM: Pt state she would like a nurse to callback in regards to pre approval for hip surgery for cardiology

## 2024-03-04 NOTE — Telephone Encounter (Signed)
 Copied from CRM [id number]. Topic: General - Call Back - No Documentation >> Mar 04, 2024  3:13 PM Clyde Darling P wrote: Reason for CRM: Pt callback to advise she was able to be scheduled with the cardiologist.

## 2024-03-04 NOTE — Telephone Encounter (Signed)
 Copied from CRM [id number]. Topic: Referral - Question >> Mar 04, 2024 12:17 PM Tracy Phillips wrote: Reason for CRM: Patient is calling regarding referral submitted for her to Cardiology, patient states the office doesn't have an appointment for her pre op clearance until after her surgery which is 5/1. Patient would like to know if she can have a referral to a different facility to get in before her surgery.   Tracy Phillips [phone]

## 2024-03-04 NOTE — Telephone Encounter (Signed)
 Called and spoke with patient to give her message below. She was already scheduled with [facility] after I spoke with her on the phone earlier. She was to keep this appointment for 4/24. I have cancelled the appt with [facility] cardiology.

## 2024-03-05 ENCOUNTER — Other Ambulatory Visit: Payer: Self-pay

## 2024-03-05 ENCOUNTER — Encounter
Admission: RE | Admit: 2024-03-05 | Discharge: 2024-03-05 | Disposition: A | Discharge status: Home / Self Care | Source: Ambulatory Visit | Attending: Orthopedic Surgery | Admitting: Orthopedic Surgery

## 2024-03-05 DIAGNOSIS — Z01818 Encounter for other preprocedural examination: Secondary | ICD-10-CM

## 2024-03-05 DIAGNOSIS — Z01812 Encounter for preprocedural laboratory examination: Secondary | ICD-10-CM | POA: Diagnosis not present

## 2024-03-05 HISTORY — DX: Family history of other specified conditions: Z84.89

## 2024-03-05 HISTORY — DX: Personal history of urinary calculi: Z87.442

## 2024-03-05 HISTORY — DX: Gastro-esophageal reflux disease without esophagitis: K21.9

## 2024-03-05 HISTORY — DX: Alcohol abuse, uncomplicated: F10.10

## 2024-03-05 HISTORY — DX: Gestational diabetes mellitus in pregnancy, unspecified control: O24.419

## 2024-03-05 HISTORY — DX: Anemia, unspecified: D64.9

## 2024-03-05 HISTORY — DX: Deficiency of other specified B group vitamins: E53.8

## 2024-03-05 HISTORY — DX: Other specified abnormal findings of blood chemistry: R79.89

## 2024-03-05 HISTORY — DX: Anxiety disorder, unspecified: F41.9

## 2024-03-05 HISTORY — DX: Cocaine use, unspecified, uncomplicated: F14.90

## 2024-03-05 LAB — URINALYSIS, ROUTINE W REFLEX MICROSCOPIC
Glucose, UA: NEGATIVE mg/dL
Hgb urine dipstick: NEGATIVE
Ketones, ur: NEGATIVE mg/dL
Leukocytes,Ua: NEGATIVE
Nitrite: NEGATIVE
Protein, ur: 30 mg/dL — AB
Specific Gravity, Urine: 1.033 — ABNORMAL HIGH (ref 1.005–1.030)
pH: 5 (ref 5.0–8.0)

## 2024-03-05 LAB — SURGICAL PCR SCREEN
MRSA, PCR: NEGATIVE
Staphylococcus aureus: NEGATIVE

## 2024-03-05 LAB — TYPE AND SCREEN
ABO/RH(D): O NEG
Antibody Screen: NEGATIVE

## 2024-03-05 NOTE — Patient Instructions (Addendum)
 Your procedure is scheduled on:03-18-24 Thursday Report to the Registration Desk on the 1st floor of the [facility].Then proceed to the 2nd floor Surgery Desk To find out your arrival time, please call [phone] between 1PM - 3PM on:03-17-24 Wednesday If your arrival time is 6:00 am, do not arrive before that time as the [facility] entrance doors do not open until 6:00 am.  REMEMBER: Instructions that are not followed completely may result in serious medical risk, up to and including death; or upon the discretion of your surgeon and anesthesiologist your surgery may need to be rescheduled.  Do not eat food after midnight the night before surgery.  No gum chewing or hard candies.  You may however, drink CLEAR liquids up to 2 hours before you are scheduled to arrive for your surgery. Do not drink anything within 2 hours of your scheduled arrival time.  Clear liquids include: - water  - apple juice without pulp - gatorade (not RED colors) - black coffee or tea (Do NOT add milk or creamers to the coffee or tea) Do NOT drink anything that is not on this list.  In addition, your doctor has ordered for you to drink the provided:  Ensure Pre-Surgery Clear Carbohydrate Drink  Drinking this carbohydrate drink up to two hours before surgery helps to reduce insulin resistance and improve patient outcomes. Please complete drinking 2 hours before scheduled arrival time.  One week prior to surgery:Last dose will be on 03-10-24 Stop Anti-inflammatories (NSAIDS) such as Advil, Aleve, Ibuprofen, Motrin, Naproxen, Naprosyn and Aspirin  based products such as Excedrin, Goody's Powder, BC Powder. Stop ANY OVER THE COUNTER supplements until after surgery (Vitamin D3, Vitamin B 12) Continue your Folic Acid  since it is a prescription  You may however, continue to take Tylenol  if needed for pain up until the day of surgery.  Continue taking all of your other prescription medications up until the day of  surgery.  ON THE DAY OF SURGERY ONLY TAKE THESE MEDICATIONS WITH SIPS OF WATER: -busPIRone  (BUSPAR )  -DULoxetine  (CYMBALTA )  -pantoprazole  (PROTONIX )  -You may take LORazepam  (ATIVAN ) if needed for anxiety  No Alcohol for 24 hours before or after surgery.  No Smoking including e-cigarettes for 24 hours before surgery.  No chewable tobacco products for at least 6 hours before surgery.  No nicotine patches on the day of surgery.  Do not use any "recreational" drugs for at least a week (preferably 2 weeks) before your surgery.  Please be advised that the combination of cocaine and anesthesia may have negative outcomes, up to and including death. If you test positive for cocaine, your surgery will be cancelled.  On the morning of surgery brush your teeth with toothpaste and water, you may rinse your mouth with mouthwash if you wish. Do not swallow any toothpaste or mouthwash.  Use CHG Soap as directed on instruction sheet.  Do not wear jewelry, make-up, hairpins, clips or nail polish.  For welded (permanent) jewelry: bracelets, anklets, waist bands, etc.  Please have this removed prior to surgery.  If it is not removed, there is a chance that hospital personnel will need to cut it off on the day of surgery.  Do not wear lotions, powders, or perfumes.   Do not shave body hair from the neck down 48 hours before surgery.  Contact lenses, hearing aids and dentures may not be worn into surgery.  Do not bring valuables to the hospital. [facility] is not responsible for any missing/lost belongings  or valuables.   Notify your doctor if there is any change in your medical condition (cold, fever, infection).  Wear comfortable clothing (specific to your surgery type) to the hospital.  After surgery, you can help prevent lung complications by doing breathing exercises.  Take deep breaths and cough every 1-2 hours. Your doctor may order a device called an Incentive Spirometer to help you take  deep breaths. When coughing or sneezing, hold a pillow firmly against your incision with both hands. This is called "splinting." Doing this helps protect your incision. It also decreases belly discomfort.  If you are being admitted to the hospital overnight, leave your suitcase in the car. After surgery it may be brought to your room.  In case of increased patient census, it may be necessary for you, the patient, to continue your postoperative care in the Same Day Surgery department.  If you are being discharged the day of surgery, you will not be allowed to drive home. You will need a responsible individual to drive you home and stay with you for 24 hours after surgery.   If you are taking public transportation, you will need to have a responsible individual with you.  Please call the Pre-admissions Testing Dept. at [phone] if you have any questions about these instructions.  Surgery Visitation Policy:  Patients having surgery or a procedure may have two visitors.  Children under the age of 54 must have an adult with them who is not the patient.  Inpatient Visitation:    Visiting hours are 7 a.m. to 8 p.m. Up to four visitors are allowed at one time in a patient room. The visitors may rotate out with other people during the day.  One visitor age 77 or older may stay with the patient overnight and must be in the room by 8 p.m.    Pre-operative 5 CHG Bath Instructions   You can play a key role in reducing the risk of infection after surgery. Your skin needs to be as free of germs as possible. You can reduce the number of germs on your skin by washing with CHG (chlorhexidine gluconate) soap before surgery. CHG is an antiseptic soap that kills germs and continues to kill germs even after washing.   DO NOT use if you have an allergy to chlorhexidine/CHG or antibacterial soaps. If your skin becomes reddened or irritated, stop using the CHG and notify one of our RNs at [phone].    Please shower with the CHG soap starting 4 days before surgery using the following schedule:     Please keep in mind the following:  DO NOT shave, including legs and underarms, starting the day of your first shower.   You may shave your face at any point before/day of surgery.  Place clean sheets on your bed the day you start using CHG soap. Use a clean washcloth (not used since being washed) for each shower. DO NOT sleep with pets once you start using the CHG.   CHG Shower Instructions:  If you choose to wash your hair and private area, wash first with your normal shampoo/soap.  After you use shampoo/soap, rinse your hair and body thoroughly to remove shampoo/soap residue.  Turn the water OFF and apply about 3 tablespoons (45 ml) of CHG soap to a CLEAN washcloth.  Apply CHG soap ONLY FROM YOUR NECK DOWN TO YOUR TOES (washing for 3-5 minutes)  DO NOT use CHG soap on face, private areas, open wounds, or sores.  Pay special attention to the area where your surgery is being performed.  If you are having back surgery, having someone wash your back for you may be helpful. Wait 2 minutes after CHG soap is applied, then you may rinse off the CHG soap.  Pat dry with a clean towel  Put on clean clothes/pajamas   If you choose to wear lotion, please use ONLY the CHG-compatible lotions on the back of this paper.     Additional instructions for the day of surgery: DO NOT APPLY any lotions, deodorants, cologne, or perfumes.   Put on clean/comfortable clothes.  Brush your teeth.  Ask your nurse before applying any prescription medications to the skin.      CHG Compatible Lotions   Aveeno Moisturizing lotion  Cetaphil Moisturizing Cream  Cetaphil Moisturizing Lotion  Clairol Herbal Essence Moisturizing Lotion, Dry Skin  Clairol Herbal Essence Moisturizing Lotion, Extra Dry Skin  Clairol Herbal Essence Moisturizing Lotion, Normal Skin  Curel Age Defying Therapeutic Moisturizing Lotion  with Alpha Hydroxy  Curel Extreme Care Body Lotion  Curel Soothing Hands Moisturizing Hand Lotion  Curel Therapeutic Moisturizing Cream, Fragrance-Free  Curel Therapeutic Moisturizing Lotion, Fragrance-Free  Curel Therapeutic Moisturizing Lotion, Original Formula  Eucerin Daily Replenishing Lotion  Eucerin Dry Skin Therapy Plus Alpha Hydroxy Crme  Eucerin Dry Skin Therapy Plus Alpha Hydroxy Lotion  Eucerin Original Crme  Eucerin Original Lotion  Eucerin Plus Crme Eucerin Plus Lotion  Eucerin TriLipid Replenishing Lotion  Keri Anti-Bacterial Hand Lotion  Keri Deep Conditioning Original Lotion Dry Skin Formula Softly Scented  Keri Deep Conditioning Original Lotion, Fragrance Free Sensitive Skin Formula  Keri Lotion Fast Absorbing Fragrance Free Sensitive Skin Formula  Keri Lotion Fast Absorbing Softly Scented Dry Skin Formula  Keri Original Lotion  Keri Skin Renewal Lotion Keri Silky Smooth Lotion  Keri Silky Smooth Sensitive Skin Lotion  Nivea Body Creamy Conditioning Oil  Nivea Body Extra Enriched Lotion  Nivea Body Original Lotion  Nivea Body Sheer Moisturizing Lotion Nivea Crme  Nivea Skin Firming Lotion  NutraDerm 30 Skin Lotion  NutraDerm Skin Lotion  NutraDerm Therapeutic Skin Cream  NutraDerm Therapeutic Skin Lotion  ProShield Protective Hand Cream  Provon moisturizing lotion  How to Use an Incentive Spirometer An incentive spirometer is a tool that measures how well you are filling your lungs with each breath. Learning to take long, deep breaths using this tool can help you keep your lungs clear and active. This may help to reverse or lessen your chance of developing breathing (pulmonary) problems, especially infection. You may be asked to use a spirometer: After a surgery. If you have a lung problem or a history of smoking. After a long period of time when you have been unable to move or be active. If the spirometer includes an indicator to show the highest number  that you have reached, your health care provider or respiratory therapist will help you set a goal. Keep a log of your progress as told by your health care provider. What are the risks? Breathing too quickly may cause dizziness or cause you to pass out. Take your time so you do not get dizzy or light-headed. If you are in pain, you may need to take pain medicine before doing incentive spirometry. It is harder to take a deep breath if you are having pain. How to use your incentive spirometer  Sit up on the edge of your bed or on a chair. Hold the incentive spirometer so that it is in an  upright position. Before you use the spirometer, breathe out normally. Place the mouthpiece in your mouth. Make sure your lips are closed tightly around it. Breathe in slowly and as deeply as you can through your mouth, causing the piston or the ball to rise toward the top of the chamber. Hold your breath for 3-5 seconds, or for as long as possible. If the spirometer includes a coach indicator, use this to guide you in breathing. Slow down your breathing if the indicator goes above the marked areas. Remove the mouthpiece from your mouth and breathe out normally. The piston or ball will return to the bottom of the chamber. Rest for a few seconds, then repeat the steps 10 or more times. Take your time and take a few normal breaths between deep breaths so that you do not get dizzy or light-headed. Do this every 1-2 hours when you are awake. If the spirometer includes a goal marker to show the highest number you have reached (best effort), use this as a goal to work toward during each repetition. After each set of 10 deep breaths, cough a few times. This will help to make sure that your lungs are clear. If you have an incision on your chest or abdomen from surgery, place a pillow or a rolled-up towel firmly against the incision when you cough. This can help to reduce pain while taking deep breaths and coughing. General  tips When you are able to get out of bed: Walk around often. Continue to take deep breaths and cough in order to clear your lungs. Keep using the incentive spirometer until your health care provider says it is okay to stop using it. If you have been in the hospital, you may be told to keep using the spirometer at home. Contact a health care provider if: You are having difficulty using the spirometer. You have trouble using the spirometer as often as instructed. Your pain medicine is not giving enough relief for you to use the spirometer as told. You have a fever. Get help right away if: You develop shortness of breath. You develop a cough with bloody mucus from the lungs. You have fluid or blood coming from an incision site after you cough. Summary An incentive spirometer is a tool that can help you learn to take long, deep breaths to keep your lungs clear and active. You may be asked to use a spirometer after a surgery, if you have a lung problem or a history of smoking, or if you have been inactive for a long period of time. Use your incentive spirometer as instructed every 1-2 hours while you are awake. If you have an incision on your chest or abdomen, place a pillow or a rolled-up towel firmly against your incision when you cough. This will help to reduce pain. Get help right away if you have shortness of breath, you cough up bloody mucus, or blood comes from your incision when you cough. This information is not intended to replace advice given to you by your health care provider. Make sure you discuss any questions you have with your health care provider. Document Revised: 09/12/2023 Document Reviewed: 09/12/2023 Elsevier Patient Education  2024 [employer].  Preoperative Educational Videos for Total Hip, Knee and Shoulder Replacements  To better prepare for surgery, please view our videos that explain the physical activity and discharge planning required to have the best surgical  recovery at [facility].  [email or web address]  Questions? Call [phone] or email [email or web address]

## 2024-03-07 ENCOUNTER — Other Ambulatory Visit: Payer: Self-pay | Admitting: Internal Medicine

## 2024-03-08 ENCOUNTER — Other Ambulatory Visit: Payer: Self-pay | Admitting: Psychiatry

## 2024-03-08 DIAGNOSIS — F331 Major depressive disorder, recurrent, moderate: Secondary | ICD-10-CM

## 2024-03-11 ENCOUNTER — Ambulatory Visit: Admitting: Medical

## 2024-03-12 ENCOUNTER — Ambulatory Visit: Attending: Nurse Practitioner | Admitting: Nurse Practitioner

## 2024-03-12 ENCOUNTER — Encounter: Payer: Self-pay | Admitting: Nurse Practitioner

## 2024-03-12 VITALS — BP 128/88 | HR 88 | Resp 16 | Ht 63.0 in | Wt 198.0 lb

## 2024-03-12 DIAGNOSIS — Z01810 Encounter for preprocedural cardiovascular examination: Secondary | ICD-10-CM | POA: Diagnosis not present

## 2024-03-12 DIAGNOSIS — E7800 Pure hypercholesterolemia, unspecified: Secondary | ICD-10-CM

## 2024-03-12 DIAGNOSIS — R030 Elevated blood-pressure reading, without diagnosis of hypertension: Secondary | ICD-10-CM | POA: Diagnosis not present

## 2024-03-12 DIAGNOSIS — I2489 Other forms of acute ischemic heart disease: Secondary | ICD-10-CM

## 2024-03-12 DIAGNOSIS — M1612 Unilateral primary osteoarthritis, left hip: Secondary | ICD-10-CM | POA: Diagnosis not present

## 2024-03-12 DIAGNOSIS — Z01818 Encounter for other preprocedural examination: Secondary | ICD-10-CM

## 2024-03-12 NOTE — Progress Notes (Signed)
 Office Visit    Patient Name: Tracy Phillips Date of Encounter: 03/12/2024  Primary Care Provider:  Dellar Fenton, MD Primary Cardiologist:  Belva Boyden, MD  Chief Complaint    58 y.o. female with a history of hyperlipidemia, alcohol use disorder, anxiety, depression, cocaine use, family history of CAD, and chest pain with demand ischemia, who presents for follow-up related for preoperative cardiovascular evaluation.  Past Medical History  Subjective   Past Medical History:  Diagnosis Date   Alcohol abuse    Anemia    Anxiety    Crack cocaine use    Demand ischemia (HCC)    a. 08/2023 HsTrop 107 in setting of crack cocaine; b. 08/2023 Echo: EF of 60-65%, without regional wall motion abnormalities, normal RV size and function, mild MR, and mild-moderate TR.   Depression    Elevated troponin level    Family history of adverse reaction to anesthesia    mom-n/v   GERD (gastroesophageal reflux disease)    Gestational diabetes    H/O dizziness    H/O eating disorder    Herpes    H/O   History of chicken pox    History of colon polyps    History of kidney stones    Hyperlipidemia    Hypertension    no meds   Vitamin B12 deficiency    Past Surgical History:  Procedure Laterality Date   ABDOMINAL HYSTERECTOMY  11/18/1996   APPENDECTOMY  11/19/1995   COLONOSCOPY     DILATION AND CURETTAGE OF UTERUS     multiple   TONSILLECTOMY  11/19/1995   WRIST FRACTURE SURGERY Left     Allergies  Allergies  Allergen Reactions   Aleve [Naproxen Sodium] Hives      History of Present Illness      58 y.o. y/o female with the above past medical history including hyperlipidemia, alcohol use disorder, anxiety, depression, cocaine use, family history of CAD, chest pain, and demand ischemia.  Patient was evaluated in the emergency department October 2020 for with chest pain and nausea occurring approximate 2 hours after smoking crack.  She was found to have a troponin of 107.   CK was elevated at 2500.  Creatinine was elevated at 1.34 with a BUN of 30.  EKG showed sinus tachycardia without acute ST or T changes.  Chest x-ray was unremarkable.  Urine drug screen was positive for tricyclics, cocaine, and benzodiazepines.  She was placed on heparin  and seen by our team.  Echocardiogram was performed and showed normal an EF of 60-65%, without regional wall motion abnormalities, normal RV size and function, mild MR, and mild-moderate TR.  Presentation was not felt to be consistent with acute coronary syndrome and no further cardiac evaluation was warranted.  She was readmitted in 12/2023 w/ alcohol toxicity, n/v, and chest pain.  Trops were normal.  She was treated w/ phenobarbital  taper and ativan  and subsequently discharged.    Since her February hospitalization, she has been sober.  She says she has not used drugs since October and no alcohol since February.  She is now being evaluated for left total hip arthroplasty in the setting of avascular necrosis and arthritis.  Because of hip pain, activity is somewhat limited.  She ambulates with a cane.  She does not routinely exercise.  She does not experience chest pain or dyspnea.  She is able to walk up steps, perform work around her home, and also engage in sexual intercourse.  She denies palpitations,  PND, orthopnea, dizziness, syncope, edema, or early satiety. Objective  Home Medications    Current Outpatient Medications  Medication Sig Dispense Refill   busPIRone  (BUSPAR ) 10 MG tablet TAKE 1 TABLET BY MOUTH TWICE A DAY 60 tablet 1   celecoxib  (CELEBREX ) 200 MG capsule Take 1 capsule by mouth 2 (two) times daily.     Cholecalciferol (D3-1000) 25 MCG (1000 UT) tablet Take 1,000 Units by mouth daily.     cyanocobalamin  ([facility] VITAMIN B12) 1000 MCG tablet Take 1,000 mcg by mouth daily.     DULoxetine  (CYMBALTA ) 30 MG capsule Take 3 capsules (90 mg total) by mouth daily. (Patient taking differently: Take 60 mg by mouth every  morning.) 90 capsule 0   folic acid  (FOLVITE ) 1 MG tablet Take 1 mg by mouth daily.     HYDROcodone-acetaminophen  (NORCO/VICODIN) 5-325 MG tablet Take 1 tablet by mouth 2 (two) times daily as needed for moderate pain (pain score 4-6).     LORazepam  (ATIVAN ) 0.5 MG tablet Take 0.5 mg by mouth every 8 (eight) hours as needed for anxiety.     methocarbamol  (ROBAXIN ) 750 MG tablet Take 750 mg by mouth every 8 (eight) hours as needed for muscle spasms.  1/2-1 po tid prn     pantoprazole  (PROTONIX ) 40 MG tablet Take 1 tablet (40 mg total) by mouth daily. (Patient taking differently: Take 40 mg by mouth every morning.) 30 tablet 0   promethazine  (PHENERGAN ) 12.5 MG tablet Take 12.5 mg by mouth every 6 (six) hours as needed for nausea or vomiting.     QUEtiapine  (SEROQUEL ) 100 MG tablet TAKE 1 TABLET BY MOUTH EVERYDAY AT BEDTIME (Patient taking differently: Take 100 mg by mouth at bedtime.) 30 tablet 0   No current facility-administered medications for this visit.     Physical Exam    VS:  BP (!) 144/92 (BP Location: Left Arm, Patient Position: Sitting, Cuff Size: Large)   Pulse 88   Resp 16   Ht 5\' 3"  (1.6 m)   Wt 198 lb (89.8 kg)   SpO2 95%   BMI 35.07 kg/m  , BMI Body mass index is 35.07 kg/m.     Vitals:   03/12/24 1302 03/12/24 1527  BP: (!) 144/92 128/88  Pulse: 88   Resp: 16   SpO2: 95%       GEN: Well nourished, well developed, in no acute distress. HEENT: normal. Neck: Supple, no JVD, carotid bruits, or masses. Cardiac: RRR, no murmurs, rubs, or gallops. No clubbing, cyanosis, edema.  Radials 2+/PT 2+ and equal bilaterally.  Respiratory:  Respirations regular and unlabored, clear to auscultation bilaterally. GI: Soft, nontender, nondistended, BS + x 4. MS: no deformity or atrophy. Skin: warm and dry, no rash. Neuro:  Strength and sensation are intact. Psych: Normal affect.  Accessory Clinical Findings    ECG personally reviewed by me today - EKG  Interpretation Date/Time:  Friday March 12 2024 13:04:32 EDT Ventricular Rate:  88 PR Interval:  132 QRS Duration:  86 QT Interval:  364 QTC Calculation: 440 R Axis:   26  Text Interpretation: Normal sinus rhythm Normal ECG Confirmed by Laneta Pintos [phone]) on 03/12/2024 1:17:39 PM   - no acute changes.  Lab Results  Component Value Date   WBC 4.6 02/24/2024   HGB 14.1 02/24/2024   HCT 42.0 02/24/2024   MCV 89.8 02/24/2024   PLT 245.0 02/24/2024   Lab Results  Component Value Date   CREATININE 0.86 02/24/2024   BUN 16  02/24/2024   NA 139 02/24/2024   K 4.2 02/24/2024   CL 104 02/24/2024   CO2 24 02/24/2024   Lab Results  Component Value Date   ALT 16 02/12/2024   AST 15 02/12/2024   ALKPHOS 98 02/12/2024   BILITOT 0.2 02/12/2024   Lab Results  Component Value Date   CHOL 214 (H) 09/12/2023   HDL 55 09/12/2023   LDLCALC 128 (H) 09/12/2023   LDLDIRECT 132.0 05/13/2019   TRIG 153 (H) 09/12/2023   CHOLHDL 3.9 09/12/2023    Lab Results  Component Value Date   HGBA1C 5.9 02/24/2024   Lab Results  Component Value Date   TSH 3.79 02/24/2024       Assessment & Plan    1.  Demand ischemia/preoperative cardiovascular examination: Patient with prior history of chest pain and mild troponin elevation in October 2024 in the setting of crack cocaine use.  Echocardiogram at that time showed normal LV function without wall motion abnormalities.  Presentation was not felt to be consistent with ACS - more likely coronary vasospasm in the setting of drug use vs rhabdo (CK>2000)- and no additional ischemic evaluation was felt to be warranted.  She had subsequent admission with alcohol intoxication, nausea, vomiting, and chest pain in February 2025.  Troponins were normal.  She is now off of drugs and alcohol.  In the setting of avascular necrosis and arthritis, she is pending left total hip arthroplasty next week.  She presents today for preoperative evaluation.  Activity is  somewhat limited by chronic hip pain however, activity is not limited by chest pain or dyspnea.  She is able to achieve at least 5.5 METS without symptoms.  She is low risk for cardiovascular complications related to pending noncardiac surgery.  She may proceed to surgery without additional ischemic evaluation.  2.  Hyperlipidemia: LDL 128 in October 2024.  She is not currently on statin.  10-year risk of cardiovascular event calculates to 5.99%.  Hopefully following her hip surgery, she will be able to engage in more regular activity.  We discussed the importance of dietary modifications to limit saturated fat, cholesterol, and meats, and increase intake of fruits, vegetables, grains, and nuts.  3.  Elevated blood pressure: Initial blood pressure reading was 144/92.  She is not on any antihypertensives.  She says she was very anxious.  I repeated her blood pressure and it was 128/88.  4.  Disposition: Follow-up with cardiology as needed.  Laneta Pintos, NP 03/12/2024, 1:24 PM

## 2024-03-12 NOTE — Patient Instructions (Signed)
 Medication Instructions:  Your physician recommends that you continue on your current medications as directed. Please refer to the Current Medication list given to you today.   *If you need a refill on your cardiac medications before your next appointment, please call your pharmacy*  Lab Work: No labs ordered today   Testing/Procedures: No test ordered today   Follow-Up: At [facility], you and your health needs are our priority.  As part of our continuing mission to provide you with exceptional heart care, our providers are all part of one team.  This team includes your primary Cardiologist (physician) and Advanced Practice Providers or APPs (Physician Assistants and Nurse Practitioners) who all work together to provide you with the care you need, when you need it.  Your next appointment:   As needed  Provider:   You may see Timothy Gollan, MD or one of the following Advanced Practice Providers on your designated Care Team:   Laneta Pintos, NP Gildardo Labrador, PA-C Varney Gentleman, PA-C Cadence Montgomery, PA-C Ronald Cockayne, NP Morey Ar, NP

## 2024-03-16 ENCOUNTER — Other Ambulatory Visit: Payer: Self-pay | Admitting: Internal Medicine

## 2024-03-16 ENCOUNTER — Telehealth: Payer: Self-pay

## 2024-03-16 ENCOUNTER — Encounter: Payer: Self-pay | Admitting: Orthopedic Surgery

## 2024-03-16 DIAGNOSIS — F331 Major depressive disorder, recurrent, moderate: Secondary | ICD-10-CM

## 2024-03-16 MED ORDER — QUETIAPINE FUMARATE 100 MG PO TABS
100.0000 mg | ORAL_TABLET | Freq: Every day | ORAL | 0 refills | Status: DC
Start: 2024-03-16 — End: 2024-06-09

## 2024-03-16 NOTE — Telephone Encounter (Signed)
 I have sent Seroquel  to pharmacy, [facility], [address]

## 2024-03-16 NOTE — Telephone Encounter (Signed)
 pt left message that she needs a refill on the seroquel  100mg  she is out and needs refills.  pt seen you on 3-24 next appt 6-2  Pt states that you are to take over giving refills

## 2024-03-16 NOTE — Telephone Encounter (Signed)
 pt called again requesting refill on the seroquel . pt states she out.Aaron Aas

## 2024-03-17 NOTE — Telephone Encounter (Signed)
 left message that rx was sent to the pharmacy

## 2024-03-18 ENCOUNTER — Ambulatory Visit: Payer: Self-pay | Admitting: Urgent Care

## 2024-03-18 ENCOUNTER — Encounter
Admission: RE | Disposition: A | Discharge status: Home Health | Payer: Self-pay | Source: Ambulatory Visit | Attending: Orthopedic Surgery

## 2024-03-18 ENCOUNTER — Ambulatory Visit
Admission: RE | Admit: 2024-03-18 | Discharge: 2024-03-19 | Disposition: A | Discharge status: Home Health | Source: Ambulatory Visit | Attending: Orthopedic Surgery | Admitting: Orthopedic Surgery

## 2024-03-18 ENCOUNTER — Other Ambulatory Visit: Payer: Self-pay

## 2024-03-18 ENCOUNTER — Encounter: Payer: Self-pay | Admitting: Orthopedic Surgery

## 2024-03-18 ENCOUNTER — Ambulatory Visit

## 2024-03-18 DIAGNOSIS — M1612 Unilateral primary osteoarthritis, left hip: Secondary | ICD-10-CM | POA: Insufficient documentation

## 2024-03-18 DIAGNOSIS — Z01818 Encounter for other preprocedural examination: Secondary | ICD-10-CM

## 2024-03-18 DIAGNOSIS — Z96642 Presence of left artificial hip joint: Secondary | ICD-10-CM | POA: Diagnosis present

## 2024-03-18 HISTORY — PX: TOTAL HIP ARTHROPLASTY: SHX124

## 2024-03-18 SURGERY — ARTHROPLASTY, HIP, TOTAL, ANTERIOR APPROACH
Anesthesia: Spinal | Site: Hip | Laterality: Left

## 2024-03-18 MED ORDER — PROPOFOL 1000 MG/100ML IV EMUL
INTRAVENOUS | Status: AC
  Filled 2024-03-18: qty 100

## 2024-03-18 MED ORDER — SURGIFLO WITH THROMBIN (HEMOSTATIC MATRIX KIT) OPTIME
TOPICAL | Status: DC | PRN
  Administered 2024-03-18: 1 via TOPICAL

## 2024-03-18 MED ORDER — DOCUSATE SODIUM 100 MG PO CAPS
100.0000 mg | ORAL_CAPSULE | Freq: Two times a day (BID) | ORAL | Status: DC
  Administered 2024-03-18 – 2024-03-19 (×2): 100 mg via ORAL
  Filled 2024-03-18 (×2): qty 1

## 2024-03-18 MED ORDER — BUPIVACAINE HCL (PF) 0.5 % IJ SOLN
INTRAMUSCULAR | Status: DC | PRN
  Administered 2024-03-18: 2.4 mL

## 2024-03-18 MED ORDER — CEFAZOLIN SODIUM-DEXTROSE 2-4 GM/100ML-% IV SOLN
2.0000 g | INTRAVENOUS | Status: AC
  Administered 2024-03-18: 2 g via INTRAVENOUS

## 2024-03-18 MED ORDER — METOCLOPRAMIDE HCL 5 MG PO TABS: 5.0000 mg | ORAL_TABLET | Freq: Three times a day (TID) | ORAL | Status: DC | PRN

## 2024-03-18 MED ORDER — KETOROLAC TROMETHAMINE 15 MG/ML IJ SOLN
7.5000 mg | Freq: Four times a day (QID) | INTRAMUSCULAR | Status: DC
Start: 2024-03-18 — End: 2024-03-19
  Administered 2024-03-18 – 2024-03-19 (×3): 7.5 mg via INTRAVENOUS
  Filled 2024-03-18 (×3): qty 1

## 2024-03-18 MED ORDER — TRANEXAMIC ACID-NACL 1000-0.7 MG/100ML-% IV SOLN
1000.0000 mg | INTRAVENOUS | Status: AC
Start: 2024-03-18 — End: 2024-03-18
  Administered 2024-03-18 (×2): 1000 mg via INTRAVENOUS

## 2024-03-18 MED ORDER — DULOXETINE HCL 30 MG PO CPEP
60.0000 mg | ORAL_CAPSULE | ORAL | Status: DC
  Administered 2024-03-19: 60 mg via ORAL
  Filled 2024-03-18: qty 2

## 2024-03-18 MED ORDER — TRAMADOL HCL 50 MG PO TABS: 50.0000 mg | ORAL_TABLET | Freq: Four times a day (QID) | ORAL | Status: DC | PRN

## 2024-03-18 MED ORDER — FENTANYL CITRATE (PF) 100 MCG/2ML IJ SOLN
25.0000 ug | INTRAMUSCULAR | Status: AC | PRN
  Administered 2024-03-18 (×6): 25 ug via INTRAVENOUS

## 2024-03-18 MED ORDER — PHENYLEPHRINE HCL-NACL 20-0.9 MG/250ML-% IV SOLN
INTRAVENOUS | Status: DC | PRN
  Administered 2024-03-18: 40 ug/min via INTRAVENOUS

## 2024-03-18 MED ORDER — PROPOFOL 500 MG/50ML IV EMUL
INTRAVENOUS | Status: DC | PRN
  Administered 2024-03-18: 125 ug/kg/min via INTRAVENOUS

## 2024-03-18 MED ORDER — SODIUM CHLORIDE (PF) 0.9 % IJ SOLN
INTRAMUSCULAR | Status: DC | PRN
  Administered 2024-03-18: 50 mL via INTRAMUSCULAR

## 2024-03-18 MED ORDER — OXYCODONE HCL 5 MG PO TABS
ORAL_TABLET | ORAL | Status: AC
  Filled 2024-03-18: qty 1

## 2024-03-18 MED ORDER — ONDANSETRON HCL 4 MG/2ML IJ SOLN: 4.0000 mg | Freq: Four times a day (QID) | INTRAMUSCULAR | Status: DC | PRN

## 2024-03-18 MED ORDER — HYDROCODONE-ACETAMINOPHEN 5-325 MG PO TABS
1.0000 | ORAL_TABLET | ORAL | Status: DC | PRN
  Administered 2024-03-18 – 2024-03-19 (×3): 2 via ORAL
  Filled 2024-03-18 (×3): qty 2

## 2024-03-18 MED ORDER — TETRACAINE HCL 0.5 % OP SOLN
1.0000 [drp] | Freq: Once | OPHTHALMIC | Status: AC
  Administered 2024-03-18: 1 [drp] via OPHTHALMIC
  Filled 2024-03-18: qty 2
  Filled 2024-03-18: qty 4

## 2024-03-18 MED ORDER — ACETAMINOPHEN 10 MG/ML IV SOLN
INTRAVENOUS | Status: DC | PRN
  Administered 2024-03-18: 1000 mg via INTRAVENOUS

## 2024-03-18 MED ORDER — DEXAMETHASONE SODIUM PHOSPHATE 10 MG/ML IJ SOLN
8.0000 mg | Freq: Once | INTRAMUSCULAR | Status: AC
Start: 2024-03-18 — End: 2024-03-18
  Administered 2024-03-18: 8 mg via INTRAVENOUS

## 2024-03-18 MED ORDER — SOD CITRATE-CITRIC ACID 500-334 MG/5ML PO SOLN
30.0000 mL | Freq: Once | ORAL | Status: AC
  Administered 2024-03-18: 30 mL via ORAL
  Filled 2024-03-18 (×3): qty 30

## 2024-03-18 MED ORDER — TRANEXAMIC ACID-NACL 1000-0.7 MG/100ML-% IV SOLN
INTRAVENOUS | Status: AC
  Filled 2024-03-18: qty 100

## 2024-03-18 MED ORDER — CEFAZOLIN SODIUM-DEXTROSE 2-4 GM/100ML-% IV SOLN
2.0000 g | Freq: Four times a day (QID) | INTRAVENOUS | Status: AC
  Administered 2024-03-18 (×2): 2 g via INTRAVENOUS
  Filled 2024-03-18 (×2): qty 100

## 2024-03-18 MED ORDER — 0.9 % SODIUM CHLORIDE (POUR BTL) OPTIME
TOPICAL | Status: DC | PRN
  Administered 2024-03-18: 500 mL

## 2024-03-18 MED ORDER — FENTANYL CITRATE (PF) 100 MCG/2ML IJ SOLN
INTRAMUSCULAR | Status: AC
  Filled 2024-03-18: qty 2

## 2024-03-18 MED ORDER — LIDOCAINE HCL (PF) 2 % IJ SOLN
INTRAMUSCULAR | Status: AC
  Filled 2024-03-18: qty 5

## 2024-03-18 MED ORDER — METOCLOPRAMIDE HCL 5 MG/ML IJ SOLN: 5.0000 mg | Freq: Three times a day (TID) | INTRAMUSCULAR | Status: DC | PRN

## 2024-03-18 MED ORDER — MENTHOL 3 MG MT LOZG: 1.0000 | LOZENGE | OROMUCOSAL | Status: DC | PRN

## 2024-03-18 MED ORDER — CHLORHEXIDINE GLUCONATE 0.12 % MT SOLN
15.0000 mL | Freq: Once | OROMUCOSAL | Status: AC
  Administered 2024-03-18: 15 mL via OROMUCOSAL

## 2024-03-18 MED ORDER — QUETIAPINE FUMARATE 25 MG PO TABS
100.0000 mg | ORAL_TABLET | Freq: Every day | ORAL | Status: DC
  Administered 2024-03-18: 100 mg via ORAL
  Filled 2024-03-18: qty 4

## 2024-03-18 MED ORDER — ONDANSETRON HCL 4 MG PO TABS
4.0000 mg | ORAL_TABLET | Freq: Four times a day (QID) | ORAL | Status: DC | PRN
  Filled 2024-03-18: qty 1

## 2024-03-18 MED ORDER — ACETAMINOPHEN 10 MG/ML IV SOLN
1000.0000 mg | Freq: Once | INTRAVENOUS | Status: DC | PRN
  Administered 2024-03-18: 1000 mg via INTRAVENOUS

## 2024-03-18 MED ORDER — ONDANSETRON HCL 4 MG/2ML IJ SOLN: 4.0000 mg | Freq: Once | INTRAMUSCULAR | Status: DC | PRN

## 2024-03-18 MED ORDER — PHENOL 1.4 % MT LIQD: 1.0000 | OROMUCOSAL | Status: DC | PRN

## 2024-03-18 MED ORDER — PROPOFOL 10 MG/ML IV BOLUS
INTRAVENOUS | Status: AC
  Filled 2024-03-18: qty 20

## 2024-03-18 MED ORDER — PROPOFOL 10 MG/ML IV BOLUS
INTRAVENOUS | Status: DC | PRN
  Administered 2024-03-18 (×2): 50 mg via INTRAVENOUS
  Administered 2024-03-18: 30 mg via INTRAVENOUS
  Administered 2024-03-18: 50 mg via INTRAVENOUS

## 2024-03-18 MED ORDER — OXYCODONE HCL 5 MG/5ML PO SOLN: 5.0000 mg | Freq: Once | ORAL | Status: AC | PRN

## 2024-03-18 MED ORDER — OXYCODONE HCL 5 MG PO TABS
5.0000 mg | ORAL_TABLET | Freq: Once | ORAL | Status: AC | PRN
  Administered 2024-03-18: 5 mg via ORAL

## 2024-03-18 MED ORDER — SODIUM CHLORIDE 0.9 % IV SOLN: INTRAVENOUS | Status: DC

## 2024-03-18 MED ORDER — SODIUM CHLORIDE 0.9 % IR SOLN
Status: DC | PRN
  Administered 2024-03-18: 100 mL

## 2024-03-18 MED ORDER — ENOXAPARIN SODIUM 40 MG/0.4ML IJ SOSY
40.0000 mg | PREFILLED_SYRINGE | INTRAMUSCULAR | Status: DC
  Administered 2024-03-19: 40 mg via SUBCUTANEOUS
  Filled 2024-03-18: qty 0.4

## 2024-03-18 MED ORDER — PANTOPRAZOLE SODIUM 40 MG PO TBEC
40.0000 mg | DELAYED_RELEASE_TABLET | Freq: Every day | ORAL | Status: DC
  Administered 2024-03-18 – 2024-03-19 (×2): 40 mg via ORAL
  Filled 2024-03-18 (×2): qty 1

## 2024-03-18 MED ORDER — ACETAMINOPHEN 500 MG PO TABS
1000.0000 mg | ORAL_TABLET | Freq: Three times a day (TID) | ORAL | Status: DC
  Filled 2024-03-18 (×3): qty 2

## 2024-03-18 MED ORDER — LACTATED RINGERS IV SOLN: INTRAVENOUS | Status: DC

## 2024-03-18 MED ORDER — CHLORHEXIDINE GLUCONATE 0.12 % MT SOLN
OROMUCOSAL | Status: AC
Start: 2024-03-18 — End: ?
  Filled 2024-03-18: qty 15

## 2024-03-18 MED ORDER — BUSPIRONE HCL 10 MG PO TABS
10.0000 mg | ORAL_TABLET | Freq: Two times a day (BID) | ORAL | Status: DC
  Administered 2024-03-18 – 2024-03-19 (×2): 10 mg via ORAL
  Filled 2024-03-18 (×2): qty 1

## 2024-03-18 MED ORDER — PHENYLEPHRINE 80 MCG/ML (10ML) SYRINGE FOR IV PUSH (FOR BLOOD PRESSURE SUPPORT)
PREFILLED_SYRINGE | INTRAVENOUS | Status: DC | PRN
Start: 2024-03-18 — End: 2024-03-18
  Administered 2024-03-18 (×3): 80 ug via INTRAVENOUS

## 2024-03-18 MED ORDER — LIDOCAINE HCL (CARDIAC) PF 100 MG/5ML IV SOSY
PREFILLED_SYRINGE | INTRAVENOUS | Status: DC | PRN
  Administered 2024-03-18: 40 mg via INTRAVENOUS

## 2024-03-18 MED ORDER — ACETAMINOPHEN 325 MG PO TABS: 325.0000 mg | ORAL_TABLET | Freq: Four times a day (QID) | ORAL | Status: DC | PRN

## 2024-03-18 MED ORDER — CEFAZOLIN SODIUM-DEXTROSE 2-4 GM/100ML-% IV SOLN
INTRAVENOUS | Status: AC
  Filled 2024-03-18: qty 100

## 2024-03-18 MED ORDER — ACETAMINOPHEN 10 MG/ML IV SOLN
INTRAVENOUS | Status: AC
  Filled 2024-03-18: qty 100

## 2024-03-18 MED ORDER — MIDAZOLAM HCL 2 MG/2ML IJ SOLN
INTRAMUSCULAR | Status: AC
  Filled 2024-03-18: qty 2

## 2024-03-18 MED ORDER — SURGIPHOR WOUND IRRIGATION SYSTEM - OPTIME: TOPICAL | Status: DC | PRN

## 2024-03-18 MED ORDER — MORPHINE SULFATE (PF) 2 MG/ML IV SOLN
0.5000 mg | INTRAVENOUS | Status: DC | PRN
  Administered 2024-03-18: 1 mg via INTRAVENOUS
  Filled 2024-03-18: qty 1

## 2024-03-18 MED ORDER — ORAL CARE MOUTH RINSE: 15.0000 mL | Freq: Once | OROMUCOSAL | Status: AC

## 2024-03-18 MED ORDER — ACETAMINOPHEN 10 MG/ML IV SOLN
INTRAVENOUS | Status: AC
Start: 2024-03-18 — End: ?
  Filled 2024-03-18: qty 100

## 2024-03-18 MED ORDER — MIDAZOLAM HCL 5 MG/5ML IJ SOLN
INTRAMUSCULAR | Status: DC | PRN
  Administered 2024-03-18: 2 mg via INTRAVENOUS

## 2024-03-18 MED ORDER — PROMETHAZINE HCL 25 MG PO TABS
12.5000 mg | ORAL_TABLET | Freq: Four times a day (QID) | ORAL | Status: DC | PRN
  Administered 2024-03-18 – 2024-03-19 (×2): 12.5 mg via ORAL
  Filled 2024-03-18 (×5): qty 1

## 2024-03-18 MED ORDER — ONDANSETRON HCL 4 MG/2ML IJ SOLN
INTRAMUSCULAR | Status: DC | PRN
  Administered 2024-03-18 (×2): 4 mg via INTRAVENOUS

## 2024-03-18 MED ORDER — LORAZEPAM 0.5 MG PO TABS
0.5000 mg | ORAL_TABLET | Freq: Three times a day (TID) | ORAL | Status: DC | PRN
  Administered 2024-03-18 – 2024-03-19 (×3): 0.5 mg via ORAL
  Filled 2024-03-18 (×3): qty 1

## 2024-03-18 SURGICAL SUPPLY — 61 items
BIT DRILL TRIDENT 4X40 SU (BIT) IMPLANT
BLADE CLIPPER SURG (BLADE) IMPLANT
BLADE SAGITTAL AGGR TOOTH XLG (BLADE) ×1 IMPLANT
BNDG COHESIVE 6X5 TAN ST LF (GAUZE/BANDAGES/DRESSINGS) ×2 IMPLANT
BRUSH SCRUB EZ PLAIN DRY (MISCELLANEOUS) ×1 IMPLANT
CHLORAPREP W/TINT 26 (MISCELLANEOUS) ×1 IMPLANT
DERMABOND ADVANCED .7 DNX12 (GAUZE/BANDAGES/DRESSINGS) ×1 IMPLANT
DRAPE C-ARM XRAY 36X54 (DRAPES) ×1 IMPLANT
DRAPE SHEET LG 3/4 BI-LAMINATE (DRAPES) ×3 IMPLANT
DRAPE TABLE BACK 80X90 (DRAPES) ×1 IMPLANT
DRSG MEPILEX SACRM 8.7X9.8 (GAUZE/BANDAGES/DRESSINGS) ×1 IMPLANT
DRSG OPSITE POSTOP 4X8 (GAUZE/BANDAGES/DRESSINGS) ×1 IMPLANT
ELECTRODE BLDE 4.0 EZ CLN MEGD (MISCELLANEOUS) ×1 IMPLANT
ELECTRODE REM PT RTRN 9FT ADLT (ELECTROSURGICAL) ×1 IMPLANT
GLOVE BIO SURGEON STRL SZ8 (GLOVE) ×1 IMPLANT
GLOVE BIOGEL PI IND STRL 8 (GLOVE) ×1 IMPLANT
GLOVE PI ORTHO PRO STRL 7.5 (GLOVE) ×2 IMPLANT
GLOVE PI ORTHO PRO STRL SZ8 (GLOVE) ×2 IMPLANT
GLOVE SURG SYN 7.5 E (GLOVE) ×1 IMPLANT
GLOVE SURG SYN 7.5 PF PI (GLOVE) ×1 IMPLANT
GOWN SRG XL LVL 3 NONREINFORCE (GOWNS) ×1 IMPLANT
GOWN STRL REUS W/ TWL LRG LVL3 (GOWN DISPOSABLE) ×1 IMPLANT
GOWN STRL REUS W/ TWL XL LVL3 (GOWN DISPOSABLE) ×1 IMPLANT
HEAD CERAMIC FEMORAL 36MM (Head) IMPLANT
HOOD PEEL AWAY T7 (MISCELLANEOUS) ×2 IMPLANT
INSERT 0 DEGREE 36 (Miscellaneous) IMPLANT
IV NS 100ML SINGLE PACK (IV SOLUTION) ×1 IMPLANT
KIT PATIENT CARE HANA TABLE (KITS) ×1 IMPLANT
KIT TURNOVER CYSTO (KITS) ×1 IMPLANT
LIGHT WAVEGUIDE WIDE FLAT (MISCELLANEOUS) ×1 IMPLANT
MANIFOLD NEPTUNE II (INSTRUMENTS) ×1 IMPLANT
MARKER SKIN DUAL TIP RULER LAB (MISCELLANEOUS) ×1 IMPLANT
MAT ABSORB FLUID 56X50 GRAY (MISCELLANEOUS) ×1 IMPLANT
NDL SPNL 20GX3.5 QUINCKE YW (NEEDLE) ×1 IMPLANT
NEEDLE SPNL 20GX3.5 QUINCKE YW (NEEDLE) ×1 IMPLANT
NS IRRIG 500ML POUR BTL (IV SOLUTION) ×1 IMPLANT
PACK HIP COMPR (MISCELLANEOUS) ×1 IMPLANT
PAD ARMBOARD POSITIONER FOAM (MISCELLANEOUS) ×1 IMPLANT
PENCIL SMOKE EVACUATOR (MISCELLANEOUS) ×1 IMPLANT
SCREW HEX LP 6.5X15 (Screw) IMPLANT
SCREW HEX LP 6.5X30 (Screw) IMPLANT
SHELL ACETAB TRIDENT 48 (Shell) IMPLANT
SLEEVE SCD COMPRESS KNEE MED (STOCKING) ×1 IMPLANT
SOLUTION IRRIG SURGIPHOR (IV SOLUTION) ×1 IMPLANT
STEM HIGH OFFSET SZ4 36X103 (Stem) IMPLANT
SURGIFLO W/THROMBIN 8M KIT (HEMOSTASIS) IMPLANT
SUT BONE WAX W31G (SUTURE) ×1 IMPLANT
SUT ETHIBOND 2 V 37 (SUTURE) ×1 IMPLANT
SUT SILK 0 30XBRD TIE 6 (SUTURE) ×1 IMPLANT
SUT STRATAFIX 14 PDO 36 VLT (SUTURE) ×1 IMPLANT
SUT STRATAFIX PDO 1 14 VIOLET (SUTURE) ×1 IMPLANT
SUT VIC AB 0 CT1 36 (SUTURE) ×1 IMPLANT
SUT VIC AB 2-0 CT2 27 (SUTURE) ×1 IMPLANT
SUTURE STRATA 1 CT-1 DLB (SUTURE) ×1 IMPLANT
SUTURE STRATA SPIR 4-0 18 (SUTURE) ×1 IMPLANT
SYR 20ML LL LF (SYRINGE) ×2 IMPLANT
TAPE MICROFOAM 4IN (TAPE) IMPLANT
TOWEL OR 17X26 4PK STRL BLUE (TOWEL DISPOSABLE) IMPLANT
TRAP FLUID SMOKE EVACUATOR (MISCELLANEOUS) ×1 IMPLANT
WAND WEREWOLF FASTSEAL 6.0 (MISCELLANEOUS) ×1 IMPLANT
WATER STERILE IRR 1000ML POUR (IV SOLUTION) ×1 IMPLANT

## 2024-03-18 NOTE — Evaluation (Signed)
 Physical Therapy Evaluation Patient Details Name: Tracy Phillips MRN: [id number] DOB: 03-Aug-1966 Today's Date: 03/18/2024  History of Present Illness  Pt is s/p L anterior THA on 03/18/24.  Clinical Impression  Pt admitted with above diagnosis. Pt currently with functional limitations due to the deficits listed below (see PT Problem List). Pt received supine in bed agreeable to PT with mother in the room. PTA pt reports being independent. Had some limitations due to her chronic L hip pain. Pt plans to live with mother for short period of time upon discharge for assistance.   To date, reviewed WB and anterior hip precautions, provided HEP (reps/sets/frequency), and safe use of DME. Sensation to LT intact and excellent ankle/knee mobility. Pt mod-I with bed mobility with increased time and bed features and able to STS to RW with CGA and VC's for hand placement. Pt easily able to SPT to recliner with good stability of L hip in stance phase and no reports of lightheadedness or dizziness. Pt in recliner with ice pack provided with all needs in reach. Pending gait progression and stairs training, anticipate pt safe to d/c home with expected f/u recs to address acute deficits in pain, strength, gait, and ROM to maximize return to PLOF.       If plan is discharge home, recommend the following: A little help with walking and/or transfers;A little help with bathing/dressing/bathroom;Assist for transportation;Assistance with cooking/housework;Help with stairs or ramp for entrance   Can travel by private vehicle        Equipment Recommendations Rolling walker (2 wheels);BSC/3in1  Recommendations for Other Services       Functional Status Assessment Patient has had a recent decline in their functional status and demonstrates the ability to make significant improvements in function in a reasonable and predictable amount of time.     Precautions / Restrictions Precautions Precautions: Anterior  Hip Precaution Booklet Issued: Yes (comment) Recall of Precautions/Restrictions: Intact Restrictions Weight Bearing Restrictions Per Provider Order: Yes LLE Weight Bearing Per Provider Order: Weight bearing as tolerated      Mobility  Bed Mobility Overal bed mobility: Modified Independent               Patient Response: Cooperative  Transfers Overall transfer level: Needs assistance Equipment used: Rolling walker (2 wheels) Transfers: Sit to/from Stand, Bed to chair/wheelchair/BSC Sit to Stand: Contact guard assist           General transfer comment: VC's for hand placement    Ambulation/Gait                  Stairs            Wheelchair Mobility     Tilt Bed Tilt Bed Patient Response: Cooperative  Modified Rankin (Stroke Patients Only)       Balance Overall balance assessment: Mild deficits observed, not formally tested                                           Pertinent Vitals/Pain Pain Assessment Pain Assessment: 0-10 Pain Score: 6  Pain Location: L anterior hip Pain Descriptors / Indicators: Aching, Discomfort, Grimacing Pain Intervention(s): Limited activity within patient's tolerance, Monitored during session, Repositioned, Patient requesting pain meds-RN notified    Home Living Family/patient expects to be discharged to:: Private residence (mother's house) Living Arrangements: Parent Available Help at Discharge: Family;Available 24 hours/day Type of Home:  House Home Access: Stairs to enter   [employer] of Steps: 2 (hand bar on house)   Home Layout: Two level;Able to live on main level with bedroom/bathroom Home Equipment: Cane - single point      Prior Function Prior Level of Function : Independent/Modified Independent                     Extremity/Trunk Assessment   Upper Extremity Assessment Upper Extremity Assessment: Overall WFL for tasks assessed    Lower Extremity  Assessment Lower Extremity Assessment: Overall WFL for tasks assessed;LLE deficits/detail LLE Deficits / Details: L hip    Cervical / Trunk Assessment Cervical / Trunk Assessment: Normal  Communication   Communication Communication: No apparent difficulties    Cognition Arousal: Alert Behavior During Therapy: WFL for tasks assessed/performed   PT - Cognitive impairments: No apparent impairments                         Following commands: Intact       Cueing Cueing Techniques: Verbal cues     General Comments      Exercises Total Joint Exercises Ankle Circles/Pumps: AROM, Both, 10 reps Quad Sets: AROM, Strengthening, Left, 10 reps, Supine Gluteal Sets: AROM, Both, 10 reps, Supine Hip ABduction/ADduction: AROM, Strengthening, Left, 10 reps, Supine Other Exercises Other Exercises: Role of PT in acute setting, WB and anterior hip precautions, HEP, safe use of RW.   Assessment/Plan    PT Assessment Patient needs continued PT services  PT Problem List Decreased strength;Decreased mobility;Decreased range of motion;Decreased activity tolerance;Pain       PT Treatment Interventions DME instruction;Therapeutic exercise;Gait training;Balance training;Stair training;Neuromuscular re-education;Functional mobility training;Therapeutic activities;Patient/family education    PT Goals (Current goals can be found in the Care Plan section)  Acute Rehab PT Goals Patient Stated Goal: to improve pain and go home PT Goal Formulation: With patient Time For Goal Achievement: 04/01/24 Potential to Achieve Goals: Good    Frequency BID     Co-evaluation               AM-PAC PT "6 Clicks" Mobility  Outcome Measure Help needed turning from your back to your side while in a flat bed without using bedrails?: A Little Help needed moving from lying on your back to sitting on the side of a flat bed without using bedrails?: A Little Help needed moving to and from a bed to a  chair (including a wheelchair)?: A Little Help needed standing up from a chair using your arms (e.g., wheelchair or bedside chair)?: A Little Help needed to walk in hospital room?: A Little Help needed climbing 3-5 steps with a railing? : A Little 6 Click Score: 18    End of Session Equipment Utilized During Treatment: Gait belt Activity Tolerance: Patient tolerated treatment well Patient left: in chair;with call bell/phone within reach;with chair alarm set;with family/visitor present Nurse Communication: Mobility status PT Visit Diagnosis: Muscle weakness (generalized) (M62.81);Difficulty in walking, not elsewhere classified (R26.2);Pain Pain - Right/Left: Left Pain - part of body: Hip    Time: 1610-9604 PT Time Calculation (min) (ACUTE ONLY): 18 min   Charges:   PT Evaluation $PT Eval Low Complexity: 1 Low   PT General Charges $$ ACUTE PT VISIT: 1 Visit       Marc Senior. Fairly IV, PT, DPT Physical Therapist- [place]  [facility]  03/18/2024, 4:05 PM

## 2024-03-18 NOTE — Transfer of Care (Signed)
 Immediate Anesthesia Transfer of Care Note  Patient: Tracy Phillips  Procedure(s) Performed: ARTHROPLASTY, HIP, TOTAL, ANTERIOR APPROACH (Left: Hip)  Patient Location: PACU  Anesthesia Type:General and Spinal  Level of Consciousness: awake, oriented, and patient cooperative  Airway & Oxygen Therapy: Patient Spontanous Breathing and Patient connected to face mask oxygen  Post-op Assessment: Report given to RN, Post -op Vital signs reviewed and stable, and Patient moving all extremities X 4  Post vital signs: Reviewed and stable  Last Vitals:  Vitals Value Taken Time  BP 108/71 03/18/24 1254  Temp 36.9 C 03/18/24 1254  Pulse 86 03/18/24 1256  Resp 18 03/18/24 1256  SpO2 100 % 03/18/24 1256  Vitals shown include unfiled device data.  Last Pain:  Vitals:   03/18/24 0850  TempSrc: Temporal  PainSc: 9        Pt transported to PACU with circ RN and report given to PACU RN. Patent airway to PACU, breathing spontaneously on 6L O2 via FM. Pt awake, responsive and comfortable. VSS.   Complications: No notable events documented.

## 2024-03-18 NOTE — Interval H&P Note (Signed)
Patient history and physical updated. Consent reviewed including risks, benefits, and alternatives to surgery. Patient agrees with above plan to proceed with left anterior total hip arthroplasty  

## 2024-03-18 NOTE — Anesthesia Procedure Notes (Signed)
 Spinal  Patient location during procedure: OR Start time: 03/18/2024 10:26 AM End time: 03/18/2024 10:34 AM Reason for block: surgical anesthesia Staffing Performed: resident/CRNA  Anesthesiologist: Lattie Poli, MD Resident/CRNA: Sherrlyn Dolores, CRNA Performed by: Sherrlyn Dolores, CRNA Authorized by: Lattie Poli, MD   Preanesthetic Checklist Completed: patient identified, IV checked, site marked, risks and benefits discussed, surgical consent, monitors and equipment checked, pre-op evaluation and timeout performed Spinal Block Patient position: sitting Prep: DuraPrep Patient monitoring: heart rate, cardiac monitor, continuous pulse ox and blood pressure Approach: midline Location: L3-4 Injection technique: single-shot Needle Needle type: Pencan  Needle gauge: 24 G Needle length: 9 cm Assessment Sensory level: T4 Events: CSF return Additional Notes Sterile technique maintained throughout spinal block procedure (CHG prep, sterile gloves, sterile drape). + free flow CSF, negative heme, negative paresthesia. Patient tolerated procedure well, without complications.

## 2024-03-18 NOTE — Progress Notes (Signed)
 Called to PACU for complaints of left eye pain.  Patient underwent spinal anesthetic for left hip surgery and now complains of feeling of foreign body sensation in left eye, exacerbated by eye movement and opening. Denies vision changes. She does wear contacts normally, and removed them shortly before being wheeled from preop to the OR. She says she noticed these symptoms begin PRIOR to initiating of anesthetic.  Advised patient that given these symptoms started after she took out her contacts and prior to initiation of anesthesia, she could have caused herself a small corneal abrasion at that time. Advised that the symptoms are consistent with a corneal abrasion and that the vast majority of these improve without intervention with time. I have ordered one drop of tetracaine  ophthalmic into affected eye, one time, for symptomatic relief, and advised lubricating eye drops regularly as needed. If no improvement noticed in 24-48 hrs, advised patient to let the medical team know to contact anesthesiology department for potential ophthalmology consult.

## 2024-03-18 NOTE — H&P (Signed)
 History of Present Illness: Tracy Phillips is an 58 y.o. female presents for follow-up evaluation of her left hip prior to planned left total hip replacement. Patient has had ongoing worsening pain in her left groin for several years described as a sharp aching pain which limits her walking and feels like her legs may give out. She uses a cane for ambulation due to her left hip. She is undergone physical therapy, injections, anti-inflammatory treatments with diminishing returns. Her pain is still a 10 out of 10 at its worst and her activities are limited due to her left hip. She is ready to move forward with surgical intervention for her left hip. The patient denies fevers, chills, numbness, tingling, shortness of breath, chest pain, recent illness, or any trauma.  Patient is a non-smoker with a BMI 3 and an A1c of 5.7  Past Medical History: Past Medical History:  Diagnosis Date  Anxiety  Depression   Past Surgical History: Past Surgical History:  Procedure Laterality Date  APPENDECTOMY  HYSTERECTOMY VAGINAL  TONSILLECTOMY   Past Family History: Family History  Problem Relation Age of Onset  Diabetes Mother  Arthritis Mother  COPD Mother  High blood pressure (Hypertension) Mother  Alzheimer's disease Father  Diabetes Father  Gout Brother   Medications: Current Outpatient Medications  Medication Sig Dispense Refill  acyclovir  (ZOVIRAX ) 800 MG tablet AT FIRST SIGN OF FLARE, TAKE ONE PO TID X 2 DAYS 24 tablet 6  celecoxib  (CELEBREX ) 200 MG capsule TAKE 1 CAPSULE BY MOUTH TWICE A DAY 30 capsule 0  citalopram  (CELEXA ) 20 MG tablet Take 10 mg by mouth once daily  DULoxetine  (CYMBALTA ) 30 MG DR capsule Take 60 mg by mouth once daily  folic acid  (FOLVITE ) 1 MG tablet Take 1 mg by mouth once daily  HYDROcodone -acetaminophen  (NORCO) 5-325 mg tablet Take 1 tablet by mouth 2 (two) times daily as needed 14 tablet 0  LORazepam  (ATIVAN ) 1 MG tablet as directed  methocarbamoL  (ROBAXIN ) 750  MG tablet Take 1 tablet (750 mg total) by mouth 3 (three) times daily 30 tablet 0  pantoprazole  (PROTONIX ) 40 MG DR tablet Take 40 mg by mouth once daily  promethazine  (PHENERGAN ) 12.5 MG tablet Take 1 tablet (12.5 mg total) by mouth every 8 (eight) hours as needed for Nausea 20 tablet 0  QUEtiapine  (SEROQUEL ) 100 MG tablet Take 100 mg by mouth at bedtime   No current facility-administered medications for this visit.   Allergies: Allergies  Allergen Reactions  Naproxen Sodium Hives    Visit Vitals: Vitals:  03/12/24 1132  BP: (!) 140/88    Review of Systems:  A comprehensive 14 point ROS was performed, reviewed, and the pertinent orthopaedic findings are documented in the HPI.  Physical Exam: Body mass index is 33.99 kg/m. General/Constitutional: No apparent distress: well-nourished and well developed. Lymphatic: No palpable adenopathy. Pulmonary exam: Lungs clear to auscultation bilaterally no wheezing rales or rhonchi Cardiac exam: Regular rate and rhythm no obvious murmurs rubs or gallops. Vascular: No edema, swelling or tenderness, except as noted in detailed exam. Integumentary: No impressive skin lesions present, except as noted in detailed exam. Neuro/Psych: Normal mood and affect, oriented to person, place and time. Musculoskeletal: Normal, except as noted in detailed exam and in HPI.  Left hip exam  SKIN: intact SWELLING: none WARMTH: no warmth TENDERNESS: none, Stinchfield Positive ROM: 0 degrees internal rotation and 20 degrees external rotation and pain with internal rotation,; Hip Flexion 80 STRENGTH: limited by pain GAIT: antalgic STABILITY: stable  to testing CREPITUS: yes LEG LENGTH DISCREPANCY: right longer by .5 cm NEUROLOGICAL EXAM: normal VASCULAR EXAM: normal LUMBAR SPINE: tenderness: no straight leg raising sign: no motor exam: normal  The contralateral hip was examined for comparison and it showed: TENDERNESS: none ROM: normal and  full STRENGTH: normal STABILITY: stable to testing  Hip Imaging :  I have reviewed AP pelvis and lateral hip X-rays (2 views) taken at the last office visit of the left hip which reveal degenerative changes with bone-on-bone articulation, osteophyte formation, sclerosis, femoral head deformity with lateralization of the hip center and subchondral cyst formation. Severe left hip arthritis. Right hip shows mild generative changes with some sclerosis and femoral head and acetabulum. No fractures or dislocations noted.  Assessment:  Left hip osteoarthritis  Plan: [name] is a 58 year old female presents with left hip bone on bone arthritis. Based upon the patient's continued symptoms and failure to respond to conservative treatment, I have recommended a left total hip replacement for this patient. A long discussion took place with the patient describing what a total joint replacement is and what the procedure would entail. A hip model, similar to the implants that will be used during the operation, was utilized to demonstrate the implants. Choices of implant manufactures were discussed and reviewed. The ability to secure the implant utilizing cement or cementless (press fit) fixation was discussed. Anterior and posterior exposures were discussed. For this patient an appropriate approach will be anterior.  The hospitalization and post-operative care and rehabilitation were also discussed. The use of perioperative antibiotics and DVT prophylaxis were discussed. The risk, benefits and alternatives to a surgical intervention were discussed at length with the patient. The patient was also advised of risks related to the medical comorbidities and elevated body mass index (BMI). A lengthy discussion took place to review the most common complications including but not limited to: deep vein thrombosis, pulmonary embolus, heart attack, stroke, infection, wound breakdown, heterotopic ossification, dislocation, numbness,  leg length in-equality, intraoperative fracture, damage to nerves, tendon,muscles, arteries or other blood vessels, death and other possible complications from anesthesia. The patient was told that we will take steps to minimize these risks by using sterile technique, antibiotics and DVT prophylaxis when appropriate and follow the patient postoperatively in the office setting to monitor progress. The possibility of recurrent pain, no improvement in pain and actual worsening of pain were also discussed with the patient. The risk of dislocation following total hip replacement was discussed and potential precautions to prevent dislocation were reviewed. We had a specific conversation about her young age and her increased risk of needing a revision during her life.  Patient asked about and confirms no history of any reactions to metal or metal allergy in the past.  The discharge plan of care focused on the patient going home following surgery. The patient was encouraged to make the necessary arrangements to have someone stay with them when they are discharged home.   The benefits of surgery were discussed with the patient including the potential for improving the patient's current clinical condition through operative intervention. Alternatives to surgical intervention including continued conservative management were also discussed in detail. All questions were answered to the satisfaction of the patient. The patient participated and agreed to the plan of care as well as the use of the recommended implants for their total hip replacement surgery. An information packet was given to the patient to review prior to surgery.   Patient received clearance for surgery. All questions answered patient  agrees with plan to proceed with left anterior total replacement.   Portions of this record have been created using Scientist, clinical (histocompatibility and immunogenetics). Dictation errors have been sought, but may not have been identified and  corrected.  Venus Ginsberg MD

## 2024-03-18 NOTE — Op Note (Signed)
 Patient Name: Tracy Phillips  JYN:[id number]  Pre-Operative Diagnosis: Left hip Osteoarthritis  Post-Operative Diagnosis: (same)  Procedure: Left Total Hip Arthroplasty  Components/Implants: Cup: Trident Tritanium Clusterhole 48/D w/x2 screws    Liner: Neutral X3 Poly 36/D  Stem: Inisgnia #4 High offset  Head:Biolox ceramic 36+0  Date of Surgery: 03/18/2024  Surgeon: Venus Ginsberg MD  Assistant: Francenia Ingle PA (present and scrubbed throughout the case, critical for assistance with exposure, retraction, instrumentation, and closure)   Anesthesiologist: Girard Lam  Anesthesia: Spinal   EBL: 200cc  IVF:200cc  Complications: None   Brief history: The patient is a 58 year old female with a history of osteoarthritis of the left hip with pain limiting their range of motion and activities of daily living, which has failed multiple attempts at conservative therapy.  The risks and benefits of total hip arthroplasty as definitive surgical treatment were discussed with the patient, who opted to proceed with the operation.  After outpatient medical clearance and optimization was completed the patient was admitted to [facility] for the procedure.  All preoperative films were reviewed and an appropriate surgical plan was made prior to surgery.   Description of procedure: The patient was brought to the operating room where laterality was confirmed by all those present to be the left side.  The patient was administered spinal anesthesia on a stretcher prior to being moved supine on the operating room table. Patient was given an intravenous dose of antibiotics for surgical prophylaxis and TXA.  All bony prominences and extremities were well padded and the patient was securely attached to the table boots, a perineal post was placed and the patient had a safety strap placed.  Surgical site was prepped with alcohol and chlorhexidine . The surgical site over the hip was and draped in typical  sterile fashion with multiple layers of adhesive and nonadhesive drapes.  The incision site was marked out with a sterile marker and care was taken to assess the position of the ASIS and ensure appropriate position for the incision.    A surgical timeout was then called with participation of all staff in the room the patient was then a confirmed again and laterality confirmed.  Incision was made over the anterior lateral aspect of the proximal thigh in line with the TFL.  Appropriate retractors were placed and all bleeding vessels were coagulated within the subcutaneous and fatty layers.  An incision was made in the TFL fascia in the interval was carefully identified.  The lateral ascending branches of the circumflex vessels were identified, cauterized and carefully dissected. The main vessels were then tied with a 0 silk hand tie.  Retractors were placed around the superior lateral and inferior medial aspects of the femoral neck and a capsulotomy was performed exposing the hip joint.  Retraction stitches were placed and the capsulotomy to assist with visualization.  Femoral neck cut was then made and the femoral head was extracted after placing the leg in traction.  Bone wax was then applied to the proximal cut surface of the femur and aqua mantis was used to address any bleeding around the femoral neck cut.  Retractors were then placed around the acetabulum to fully visualize the joint space, and the remaining labral tissue was removed and pulvinar was removed.   The acetabulum was then sequentially reamed up to the appropriate size in order to get good fit and fill for the acetabular component while under fluoroscopic guidance.  Acetabular component was then placed and malleted into a secure  fit while confirming position and abduction angle and anteversion utilizing fluoroscopy.  2 screws were then placed in the acetabular cup to assist in securing the cup in place. The cup was irrigated,  a real neutral  liner was placed, impacted, and checked for stability. The femur traction was dropped and sequentially externally rotated while performing a release of the posterior and superomedial tissues off of the proximal femur to allow for mobility, care was taken to preserve the external rotators and piriformis attachments.  The remaining interval between the abductors and the capsule was dissected out and a retractor was placed over the superolateral aspect of the femur over the greater trochanter.  The leg was carefully brought down into extension and adducted to provide visualization of the proximal femur for broaching.  The femur was then sequentially broached up to an appropriate size which provided for good fill and stability to the femoral broach.  A trial neck and head were placed on the femoral broach and the leg was brought up for reduction.  The hip was reduced and manual check of stability was performed.  The hip was found to be stable in flexion internal rotation and extension external rotation.  Leg lengths were confirmed on fluoroscopy.   The hip was then dislocated the trial neck and head were removed.  The leg was then brought down into extension and adduction in the proximal femur was reexposed.  The broach trial was removed and the femur was irrigated with normal saline prior to the real femoral stem being implanted.  After the femoral stem was seated and shown to have good fit and fill the appropriate head was impacted the leg was brought up and reduced.  There was good range of motion with stability in flexion internal rotation and extension external rotation on testing.  Leg lengths were found to be appropriate on fluoroscopic evaluation at this time.  The hip was then irrigated with betdine based surgiphor solution and then saline solution.  The capsulotomy was repaired with Ethibond sutures.  A pericapsular and peritrochanteric cocktail with Exparel  and bupivacaine  was then injected as well as the  subcutaneous tissues. The fascia was closed with a #1 barbed running suture.  The deep tissues were closed with Vicryl sutures the subcutaneous tissues were closed with interrupted Vicryl sutures and a running barbed 4-0 suture.  The skin was then reinforced with Dermabond and a sterile dressing was placed.   The patient was awoken from anesthesia transferred off of the operating room table onto a hospital bed where examination of leg lengths found the leg lengths to be equal with a good distal pulse.  The patient was then transferred to the PACU in stable condition.

## 2024-03-18 NOTE — Anesthesia Preprocedure Evaluation (Signed)
 Anesthesia Evaluation  Patient identified by MRN, date of birth, ID band Patient awake    Reviewed: Allergy & Precautions, NPO status , Patient's Chart, lab work & pertinent test results  History of Anesthesia Complications Negative for: history of anesthetic complications  Airway Mallampati: II  TM Distance: >3 FB Neck ROM: Full    Dental no notable dental hx. (+) Teeth Intact   Pulmonary neg pulmonary ROS, neg sleep apnea, neg COPD, Patient abstained from smoking.Not current smoker   Pulmonary exam normal breath sounds clear to auscultation       Cardiovascular Exercise Tolerance: Good METS(-) hypertension(-) CAD and (-) Past MI (-) dysrhythmias  Rhythm:Regular Rate:Normal - Systolic murmurs  Per recent cardiology note: "Patient with prior history of chest pain and mild troponin elevation in October 2024 in the setting of crack cocaine use.  Echocardiogram at that time showed normal LV function without wall motion abnormalities.  Presentation was not felt to be consistent with ACS - more likely coronary vasospasm in the setting of drug use vs rhabdo (CK>2000)- and no additional ischemic evaluation was felt to be warranted.  She had subsequent admission with alcohol intoxication, nausea, vomiting, and chest pain in February 2025.  Troponins were normal.  She is now off of drugs and alcohol.  In the setting of avascular necrosis and arthritis, she is pending left total hip arthroplasty next week.  She presents today for preoperative evaluation.  Activity is somewhat limited by chronic hip pain however, activity is not limited by chest pain or dyspnea.  She is able to achieve at least 5.5 METS without symptoms.  She is low risk for cardiovascular complications related to pending noncardiac surgery.  She may proceed to surgery without additional ischemic evaluation."   Neuro/Psych  Headaches PSYCHIATRIC DISORDERS Anxiety Depression        GI/Hepatic ,GERD  Medicated and Controlled,,(+)     substance abuse  Prior history of drug and alcohol use, has been sober for at least a few months. Usually GERD well controlled on daily protonix , last taken 6am. Does say she has some mild residual GERD. Will plan for sodium citrate  solution preop   Endo/Other  diabetes, Well Controlled    Renal/GU negative Renal ROS     Musculoskeletal   Abdominal   Peds  Hematology   Anesthesia Other Findings Past Medical History: No date: Alcohol abuse No date: Anemia No date: Anxiety     Comment:  a.) on BZO (lorazepam ) PRN No date: Crack cocaine use No date: Demand ischemia (HCC)     Comment:  a. 08/2023 HsTrop 107 in setting of crack cocaine; b.               08/2023 Echo: EF of 60-65%, without regional wall motion               abnormalities, normal RV size and function, mild MR, and               mild-moderate TR. No date: Depression No date: Family history of adverse reaction to anesthesia     Comment:  a.) PONV in 1st degree relative (mother) No date: GERD (gastroesophageal reflux disease) No date: Gestational diabetes No date: H/O dizziness No date: H/O eating disorder No date: Herpes No date: History of chicken pox No date: History of colon polyps No date: History of kidney stones No date: Hyperlipidemia No date: Vitamin B12 deficiency  Reproductive/Obstetrics  Anesthesia Physical Anesthesia Plan  ASA: 2  Anesthesia Plan: Spinal   Post-op Pain Management: Ofirmev  IV (intra-op)*   Induction: Intravenous  PONV Risk Score and Plan: 2 and Ondansetron , Dexamethasone , Propofol  infusion, TIVA and Midazolam   Airway Management Planned: Natural Airway  Additional Equipment: None  Intra-op Plan:   Post-operative Plan:   Informed Consent: I have reviewed the patients History and Physical, chart, labs and discussed the procedure including the risks, benefits and  alternatives for the proposed anesthesia with the patient or authorized representative who has indicated his/her understanding and acceptance.       Plan Discussed with: CRNA and Surgeon  Anesthesia Plan Comments: (Discussed R/B/A of neuraxial anesthesia technique with patient: - rare risks of spinal/epidural hematoma, nerve damage, infection - Risk of PDPH - Risk of nausea and vomiting - Risk of conversion to general anesthesia and its associated risks, including sore throat, damage to lips/eyes/teeth/oropharynx, and rare risks such as cardiac and respiratory events. - Risk of allergic reactions  Discussed the role of CRNA in patient's perioperative care.  Patient voiced understanding. Patient does have contacts currently in which she will remove prior to anesthesia.)       Anesthesia Quick Evaluation

## 2024-03-19 ENCOUNTER — Encounter: Payer: Self-pay | Admitting: Orthopedic Surgery

## 2024-03-19 DIAGNOSIS — M1612 Unilateral primary osteoarthritis, left hip: Secondary | ICD-10-CM | POA: Diagnosis not present

## 2024-03-19 DIAGNOSIS — Z96642 Presence of left artificial hip joint: Secondary | ICD-10-CM | POA: Diagnosis not present

## 2024-03-19 LAB — BASIC METABOLIC PANEL WITH GFR
Anion gap: 11 (ref 5–15)
BUN: 18 mg/dL (ref 6–20)
CO2: 23 mmol/L (ref 22–32)
Calcium: 8.3 mg/dL — ABNORMAL LOW (ref 8.9–10.3)
Chloride: 100 mmol/L (ref 98–111)
Creatinine, Ser: 0.83 mg/dL (ref 0.44–1.00)
GFR, Estimated: >60 mL/min (ref >60)
Glucose, Bld: 167 mg/dL — ABNORMAL HIGH (ref 70–99)
Potassium: 3.8 mmol/L (ref 3.5–5.1)
Sodium: 134 mmol/L — ABNORMAL LOW (ref 135–145)

## 2024-03-19 LAB — CBC
HCT: 36.1 % (ref 36.0–46.0)
Hemoglobin: 11.8 g/dL — ABNORMAL LOW (ref 12.0–15.0)
MCH: 29.3 pg (ref 26.0–34.0)
MCHC: 32.7 g/dL (ref 30.0–36.0)
MCV: 89.6 fL (ref 80.0–100.0)
Platelets: 245 10*3/uL (ref 150–400)
RBC: 4.03 MIL/uL (ref 3.87–5.11)
RDW: 13.5 % (ref 11.5–15.5)
WBC: 13.6 10*3/uL — ABNORMAL HIGH (ref 4.0–10.5)
nRBC: 0 % (ref 0.0–0.2)

## 2024-03-19 MED ORDER — OXYCODONE HCL 5 MG PO TABS: 2.5000 mg | ORAL_TABLET | Freq: Four times a day (QID) | ORAL | 0 refills | Status: DC | PRN

## 2024-03-19 MED ORDER — ACETAMINOPHEN 500 MG PO TABS: 1000.0000 mg | ORAL_TABLET | Freq: Three times a day (TID) | ORAL | 0 refills | Status: DC

## 2024-03-19 MED ORDER — CELECOXIB 200 MG PO CAPS
200.0000 mg | ORAL_CAPSULE | Freq: Two times a day (BID) | ORAL | 0 refills | Status: AC
Start: 2024-03-19 — End: 2024-04-02

## 2024-03-19 MED ORDER — ENOXAPARIN SODIUM 40 MG/0.4ML IJ SOSY: 40.0000 mg | PREFILLED_SYRINGE | INTRAMUSCULAR | 0 refills | Status: DC

## 2024-03-19 MED ORDER — TRAMADOL HCL 50 MG PO TABS: 50.0000 mg | ORAL_TABLET | Freq: Four times a day (QID) | ORAL | 0 refills | Status: DC | PRN

## 2024-03-19 MED ORDER — PROMETHAZINE HCL 12.5 MG PO TABS: 12.5000 mg | ORAL_TABLET | Freq: Four times a day (QID) | ORAL | 0 refills | Status: DC | PRN

## 2024-03-19 MED ORDER — DOCUSATE SODIUM 100 MG PO CAPS: 100.0000 mg | ORAL_CAPSULE | Freq: Two times a day (BID) | ORAL | 0 refills | Status: DC

## 2024-03-19 NOTE — Plan of Care (Signed)
   Problem: Education: Goal: Knowledge of General Education information will improve Description Including pain rating scale, medication(s)/side effects and non-pharmacologic comfort measures Outcome: Progressing   Problem: Health Behavior/Discharge Planning: Goal: Ability to manage health-related needs will improve Outcome: Progressing

## 2024-03-19 NOTE — Progress Notes (Signed)
   Subjective: 1 Day Post-Op Procedure(s) (LRB): ARTHROPLASTY, HIP, TOTAL, ANTERIOR APPROACH (Left) Patient reports pain as mild.   Patient is well, and has had no acute complaints or problems Denies any CP, SOB, ABD pain. We will continue therapy today.  Plan is to go Home after hospital stay.  Objective: Vital signs in last 24 hours: Temp:  [97.2 F (36.2 C)-99.3 F (37.4 C)] 98.1 F (36.7 C) (05/02 0258) Pulse Rate:  [84-98] 92 (05/02 0258) Resp:  [10-19] 17 (05/02 0258) BP: (108-157)/(71-97) 157/73 (05/02 0258) SpO2:  [93 %-100 %] 98 % (05/02 0258)  Intake/Output from previous day: 05/01 0701 - 05/02 0700 In: 1008 [P.O.:100; I.V.:408; IV Piggyback:500] Out: 200 [Blood:200] Intake/Output this shift: No intake/output data recorded.  Recent Labs    03/19/24 0329  HGB 11.8*   Recent Labs    03/19/24 0329  WBC 13.6*  RBC 4.03  HCT 36.1  PLT 245   Recent Labs    03/19/24 0329  NA 134*  K 3.8  CL 100  CO2 23  BUN 18  CREATININE 0.83  GLUCOSE 167*  CALCIUM  8.3*   No results for input(s): "LABPT", "INR" in the last 72 hours.  EXAM General - Patient is Alert, Appropriate, and Oriented Extremity - Neurovascular intact Sensation intact distally Intact pulses distally Dorsiflexion/Plantar flexion intact Dressing - dressing C/D/I and no drainage Motor Function - intact, moving foot and toes well on exam.   Past Medical History:  Diagnosis Date   Alcohol abuse    Anemia    Anxiety    a.) on BZO (lorazepam ) PRN   Crack cocaine use    Demand ischemia (HCC)    a. 08/2023 HsTrop 107 in setting of crack cocaine; b. 08/2023 Echo: EF of 60-65%, without regional wall motion abnormalities, normal RV size and function, mild MR, and mild-moderate TR.   Depression    Family history of adverse reaction to anesthesia    a.) PONV in 1st degree relative (mother)   GERD (gastroesophageal reflux disease)    Gestational diabetes    H/O dizziness    H/O eating disorder     Herpes    History of chicken pox    History of colon polyps    History of kidney stones    Hyperlipidemia    Vitamin B12 deficiency     Assessment/Plan:   1 Day Post-Op Procedure(s) (LRB): ARTHROPLASTY, HIP, TOTAL, ANTERIOR APPROACH (Left) Principal Problem:   S/P total left hip arthroplasty  Estimated body mass index is 35.07 kg/m as calculated from the following:   Height as of 03/12/24: 5\' 3"  (1.6 m).   Weight as of 03/12/24: 89.8 kg. Advance diet Up with therapy Pain well controlled Labs and VSS CM to assist with discharge to home with HHPT today  DVT Prophylaxis - Lovenox , TED hose, and SCDs Weight-Bearing as tolerated to left leg   T. Thomos Flies, PA-C [facility] 03/19/2024, 8:00 AM

## 2024-03-19 NOTE — TOC Initial Note (Signed)
 Transition of Care [facility]) - Initial/Assessment Note    Patient Details  Name: Tracy Phillips MRN: [id number] Date of Birth: 1966-10-01  Transition of Care [facility]) CM/SW Contact:    Alexandra Ice, RN Phone Number: 03/19/2024, 10:49 AM  Clinical Narrative:                  Transition of Care [facility]) - Inpatient Brief Assessment   Patient Details  Name: Tracy Phillips MRN: [id number] Date of Birth: Dec 14, 1965  Transition of Care [facility]) CM/SW Contact:    Alexandra Ice, RN Phone Number: 03/19/2024, 10:49 AM   Clinical Narrative: Patient lives with parents. Current DME is [facility]. Per Shaun with [facility], patient is set up with outpatient therapy. Patient needs BSC and RW, sent referral to Jon with [employer] for processing.    Transition of Care Asessment: Insurance and Status: Insurance coverage has been reviewed Patient has primary care physician: Yes Home environment has been reviewed: lives with parents Prior level of function:: independent Prior/Current Home Services: No current home services Social Drivers of Health Review: SDOH reviewed no interventions necessary Readmission risk has been reviewed: Yes Transition of care needs: transition of care needs identified, TOC will continue to follow        Patient Goals and CMS Choice            Expected Discharge Plan and Services         Expected Discharge Date: 03/19/24                                    Prior Living Arrangements/Services                       Activities of Daily Living      Permission Sought/Granted                  Emotional Assessment              Admission diagnosis:  Primary osteoarthritis of left hip [M16.12] S/P total left hip arthroplasty [N82.956] Patient Active Problem List   Diagnosis Date Noted   S/P total left hip arthroplasty 03/18/2024   Pre-op evaluation 02/29/2024   MDD (major depressive disorder), recurrent episode, moderate  (HCC) 02/09/2024   History of cocaine use 02/09/2024   High risk medication use 02/09/2024   Hyponatremia 01/05/2024   Low back pain 11/30/2023   Elevated CK 10/26/2023   Hip pain 10/26/2023   Hypokalemia 10/26/2023   Demand ischemia (HCC) 09/11/2023   Chest pain 09/10/2023   AKI (acute kidney injury) (HCC) 09/10/2023   Elevated troponin 09/10/2023   Encounter for screening for coronary artery disease 06/22/2023   B12 deficiency 06/20/2023   Alcohol dependence (HCC) 04/07/2023   Anxiety disorder 04/07/2023   GERD (gastroesophageal reflux disease) 04/07/2023   MDD (major depressive disorder), recurrent severe, without psychosis (HCC) 04/06/2023   Alcohol withdrawal (HCC) 04/04/2023   Major depressive disorder, recurrent episode, severe (HCC) 04/03/2023   Alcoholic intoxication with complication (HCC) 04/03/2023   Hyperglycemia 05/02/2019   Healthcare maintenance 09/26/2017   Anxiety and depression 09/07/2017   Hot flashes 09/07/2017   Headache 11/07/2013   Hypercholesterolemia 11/07/2013   Kidney stones 11/07/2013   History of colonic polyps 11/07/2013   Herpes simplex 11/07/2013   Major depressive disorder, single episode 11/07/2013   PCP:  Dellar Fenton, MD Pharmacy:   [facility] [phone] -  [place], [place] - [address] [place] [address] Phone: [phone] Fax: [phone]     Social Drivers of Health (SDOH) Social History: SDOH Screenings   Food Insecurity: Food Insecurity Present (02/20/2024)  Housing: Low Risk  (02/20/2024)  Transportation Needs: No Transportation Needs (02/20/2024)  Utilities: Patient Declined (01/29/2024)   Received from [facility]  Alcohol Screen: Low Risk  (09/25/2023)  Depression (PHQ2-9): High Risk (02/11/2024)  Financial Resource Strain: Medium Risk (02/20/2024)  Physical Activity: Unknown (02/20/2024)  Social Connections: Moderately Integrated (02/20/2024)  Recent Concern: Social Connections -  Moderately Isolated (01/01/2024)  Stress: Stress Concern Present (02/20/2024)  Tobacco Use: Low Risk  (03/18/2024)  Health Literacy: Adequate Health Literacy (09/25/2023)   SDOH Interventions:     Readmission Risk Interventions     No data to display

## 2024-03-19 NOTE — Anesthesia Postprocedure Evaluation (Signed)
 Anesthesia Post Note  Patient: Tracy Phillips  Procedure(s) Performed: ARTHROPLASTY, HIP, TOTAL, ANTERIOR APPROACH (Left: Hip)  Patient location during evaluation: Short Stay Anesthesia Type: Spinal Level of consciousness: awake and alert and oriented Pain management: pain level controlled Vital Signs Assessment: post-procedure vital signs reviewed and stable Respiratory status: spontaneous breathing and respiratory function stable Cardiovascular status: blood pressure returned to baseline Postop Assessment: no headache, no backache, no apparent nausea or vomiting, adequate PO intake and able to ambulate Anesthetic complications: no   No notable events documented.   Last Vitals:  Vitals:   03/19/24 0003 03/19/24 0258  BP: (!) 141/84 (!) 157/73  Pulse: 98 92  Resp: 17 17  Temp: 37.1 C 36.7 C  SpO2:  98%    Last Pain:  Vitals:   03/19/24 0622  TempSrc:   PainSc: 8                  Delphina Schum D Earle Burson

## 2024-03-19 NOTE — Discharge Summary (Signed)
 Physician Discharge Summary  Patient ID: Tracy Phillips MRN: [id number] DOB/AGE: 19-Jan-1966 58 y.o.  Admit date: 03/18/2024 Discharge date: 03/19/2024  Admission Diagnoses:  Primary osteoarthritis of left hip [M16.12] S/P total left hip arthroplasty [Z96.642]   Discharge Diagnoses: Patient Active Problem List   Diagnosis Date Noted   S/P total left hip arthroplasty 03/18/2024   Pre-op evaluation 02/29/2024   MDD (major depressive disorder), recurrent episode, moderate (HCC) 02/09/2024   History of cocaine use 02/09/2024   High risk medication use 02/09/2024   Hyponatremia 01/05/2024   Low back pain 11/30/2023   Elevated CK 10/26/2023   Hip pain 10/26/2023   Hypokalemia 10/26/2023   Demand ischemia (HCC) 09/11/2023   Chest pain 09/10/2023   AKI (acute kidney injury) (HCC) 09/10/2023   Elevated troponin 09/10/2023   Encounter for screening for coronary artery disease 06/22/2023   B12 deficiency 06/20/2023   Alcohol dependence (HCC) 04/07/2023   Anxiety disorder 04/07/2023   GERD (gastroesophageal reflux disease) 04/07/2023   MDD (major depressive disorder), recurrent severe, without psychosis (HCC) 04/06/2023   Alcohol withdrawal (HCC) 04/04/2023   Major depressive disorder, recurrent episode, severe (HCC) 04/03/2023   Alcoholic intoxication with complication (HCC) 04/03/2023   Hyperglycemia 05/02/2019   Healthcare maintenance 09/26/2017   Anxiety and depression 09/07/2017   Hot flashes 09/07/2017   Headache 11/07/2013   Hypercholesterolemia 11/07/2013   Kidney stones 11/07/2013   History of colonic polyps 11/07/2013   Herpes simplex 11/07/2013   Major depressive disorder, single episode 11/07/2013    Past Medical History:  Diagnosis Date   Alcohol abuse    Anemia    Anxiety    a.) on BZO (lorazepam ) PRN   Crack cocaine use    Demand ischemia (HCC)    a. 08/2023 HsTrop 107 in setting of crack cocaine; b. 08/2023 Echo: EF of 60-65%, without regional wall motion  abnormalities, normal RV size and function, mild MR, and mild-moderate TR.   Depression    Family history of adverse reaction to anesthesia    a.) PONV in 1st degree relative (mother)   GERD (gastroesophageal reflux disease)    Gestational diabetes    H/O dizziness    H/O eating disorder    Herpes    History of chicken pox    History of colon polyps    History of kidney stones    Hyperlipidemia    Vitamin B12 deficiency      Transfusion: none   Consultants (if any):   Discharged Condition: Improved  Hospital Course: Tracy Phillips is an 58 y.o. female who was admitted 03/18/2024 with a diagnosis of S/P total left hip arthroplasty and went to the operating room on 03/18/2024 and underwent the above named procedures.    Surgeries: Procedure(s): ARTHROPLASTY, HIP, TOTAL, ANTERIOR APPROACH on 03/18/2024 Patient tolerated the surgery well. Taken to PACU where she was stabilized and then transferred to the orthopedic floor.  Started on Lovenox  40 mg q 24 hrs. TEDs and SCDs applied bilaterally. Heels elevated on bed. No evidence of DVT. Negative Homan. Physical therapy started on day #1 for gait training and transfer. OT started day #1 for ADL and assisted devices.  Patient's IV was d/c on day #1. Patient was able to safely and independently complete all PT goals. PT recommending discharge to home.    On post op day #1 patient was stable and ready for discharge to home with HHPT.  Implants: Cup: Trident Tritanium Clusterhole 48/D w/x2 screws    Liner: Neutral  X3 Poly 36/D  Stem: Inisgnia #4 High offset  Head:Biolox ceramic 36+0    She was given perioperative antibiotics:  Anti-infectives (From admission, onward)    Start     Dose/Rate Route Frequency Ordered Stop   03/18/24 1700  ceFAZolin  (ANCEF ) IVPB 2g/100 mL premix        2 g 200 mL/hr over 30 Minutes Intravenous Every 6 hours 03/18/24 1522 03/18/24 2302   03/18/24 0915  ceFAZolin  (ANCEF ) IVPB 2g/100 mL premix        2 g 200  mL/hr over 30 Minutes Intravenous On call to O.R. 03/18/24 0900 03/18/24 1121     .  She was given sequential compression devices, early ambulation, and lovenox  TEDs for DVT prophylaxis.  She benefited maximally from the hospital stay and there were no complications.    Recent vital signs:  Vitals:   03/19/24 0003 03/19/24 0258  BP: (!) 141/84 (!) 157/73  Pulse: 98 92  Resp: 17 17  Temp: 98.8 F (37.1 C) 98.1 F (36.7 C)  SpO2:  98%    Recent laboratory studies:  Lab Results  Component Value Date   HGB 11.8 (L) 03/19/2024   HGB 14.1 02/24/2024   HGB 13.1 01/05/2024   Lab Results  Component Value Date   WBC 13.6 (H) 03/19/2024   PLT 245 03/19/2024   No results found for: "INR" Lab Results  Component Value Date   NA 134 (L) 03/19/2024   K 3.8 03/19/2024   CL 100 03/19/2024   CO2 23 03/19/2024   BUN 18 03/19/2024   CREATININE 0.83 03/19/2024   GLUCOSE 167 (H) 03/19/2024    Discharge Medications:   Allergies as of 03/19/2024       Reactions   Aleve [naproxen Sodium] Hives        Medication List     STOP taking these medications    HYDROcodone -acetaminophen  5-325 MG tablet Commonly known as: NORCO/VICODIN       TAKE these medications    acetaminophen  500 MG tablet Commonly known as: TYLENOL  Take 2 tablets (1,000 mg total) by mouth every 8 (eight) hours.   busPIRone  10 MG tablet Commonly known as: BUSPAR  TAKE 1 TABLET BY MOUTH TWICE A DAY   celecoxib  200 MG capsule Commonly known as: CELEBREX  Take 1 capsule (200 mg total) by mouth 2 (two) times daily for 14 days.   [facility] Vitamin B12 1000 MCG tablet Generic drug: cyanocobalamin  Take 1,000 mcg by mouth daily.   D3-1000 25 MCG (1000 UT) tablet Generic drug: Cholecalciferol Take 1,000 Units by mouth daily.   docusate sodium  100 MG capsule Commonly known as: COLACE Take 1 capsule (100 mg total) by mouth 2 (two) times daily.   DULoxetine  30 MG capsule Commonly known as: CYMBALTA  Take 3  capsules (90 mg total) by mouth daily. What changed:  how much to take when to take this   enoxaparin  40 MG/0.4ML injection Commonly known as: LOVENOX  Inject 0.4 mLs (40 mg total) into the skin daily for 14 days.   folic acid  1 MG tablet Commonly known as: FOLVITE  Take 1 mg by mouth daily.   LORazepam  0.5 MG tablet Commonly known as: ATIVAN  Take 0.5 mg by mouth every 8 (eight) hours as needed for anxiety.   methocarbamol  750 MG tablet Commonly known as: ROBAXIN  Take 750 mg by mouth every 8 (eight) hours as needed for muscle spasms.  1/2-1 po tid prn   oxyCODONE  5 MG immediate release tablet Commonly known as: Roxicodone  Take  0.5-1 tablets (2.5-5 mg total) by mouth every 6 (six) hours as needed for breakthrough pain.   pantoprazole  40 MG tablet Commonly known as: PROTONIX  Take 1 tablet (40 mg total) by mouth daily. What changed: when to take this   promethazine  12.5 MG tablet Commonly known as: PHENERGAN  Take 12.5 mg by mouth every 6 (six) hours as needed for nausea or vomiting. What changed: Another medication with the same name was added. Make sure you understand how and when to take each.   promethazine  12.5 MG tablet Commonly known as: PHENERGAN  Take 1 tablet (12.5 mg total) by mouth every 6 (six) hours as needed for nausea or vomiting. What changed: You were already taking a medication with the same name, and this prescription was added. Make sure you understand how and when to take each.   QUEtiapine  100 MG tablet Commonly known as: SEROQUEL  Take 1 tablet (100 mg total) by mouth at bedtime.   traMADol  50 MG tablet Commonly known as: ULTRAM  Take 1 tablet (50 mg total) by mouth every 6 (six) hours as needed for moderate pain (pain score 4-6).        Diagnostic Studies: DG HIP UNILAT WITH PELVIS 2-3 VIEWS LEFT Result Date: 03/18/2024 CLINICAL DATA:  Elective surgery. EXAM: DG HIP (WITH OR WITHOUT PELVIS) 2-3V LEFT COMPARISON:  None Available. FINDINGS: Three  fluoroscopic spot views of the pelvis and left hip obtained in the operating room. Images during hip arthroplasty. Fluoroscopy time 19 seconds. Dose 3.07 mGy. IMPRESSION: Intraoperative fluoroscopy during left hip arthroplasty. Electronically Signed   By: Chadwick Colonel M.D.   On: 03/18/2024 13:08   DG C-Arm 1-60 Min-No Report Result Date: 03/18/2024 Fluoroscopy was utilized by the requesting physician.  No radiographic interpretation.   DG C-Arm 1-60 Min-No Report Result Date: 03/18/2024 Fluoroscopy was utilized by the requesting physician.  No radiographic interpretation.    Disposition:      Follow-up Information     Coralyn Derry, PA-C Follow up in 2 week(s).   Specialties: Orthopedic Surgery, Emergency Medicine Contact information: [address] [place] [address] [phone]                  Signed: Coralyn Derry 03/19/2024, 8:06 AM

## 2024-03-19 NOTE — Plan of Care (Signed)
 Problem: Education: Goal: Knowledge of General Education information will improve Description: Including pain rating scale, medication(s)/side effects and non-pharmacologic comfort measures 03/19/2024 1152 by Oza Blumenthal I, LPN Outcome: Adequate for Discharge 03/19/2024 1152 by Oza Blumenthal I, LPN Outcome: Adequate for Discharge   Problem: Health Behavior/Discharge Planning: Goal: Ability to manage health-related needs will improve 03/19/2024 1152 by Pearce Littlefield I, LPN Outcome: Adequate for Discharge 03/19/2024 1152 by Oza Blumenthal I, LPN Outcome: Adequate for Discharge   Problem: Clinical Measurements: Goal: Ability to maintain clinical measurements within normal limits will improve 03/19/2024 1152 by Kenzee Bassin I, LPN Outcome: Adequate for Discharge 03/19/2024 1152 by Oza Blumenthal I, LPN Outcome: Adequate for Discharge Goal: Will remain free from infection 03/19/2024 1152 by Zuly Belkin I, LPN Outcome: Adequate for Discharge 03/19/2024 1152 by Oza Blumenthal I, LPN Outcome: Adequate for Discharge Goal: Diagnostic test results will improve 03/19/2024 1152 by Darra Rosa I, LPN Outcome: Adequate for Discharge 03/19/2024 1152 by Oza Blumenthal I, LPN Outcome: Adequate for Discharge Goal: Respiratory complications will improve 03/19/2024 1152 by Oza Blumenthal I, LPN Outcome: Adequate for Discharge 03/19/2024 1152 by Oza Blumenthal I, LPN Outcome: Adequate for Discharge Goal: Cardiovascular complication will be avoided 03/19/2024 1152 by Oza Blumenthal I, LPN Outcome: Adequate for Discharge 03/19/2024 1152 by Oza Blumenthal I, LPN Outcome: Adequate for Discharge   Problem: Activity: Goal: Risk for activity intolerance will decrease 03/19/2024 1152 by Oza Blumenthal I, LPN Outcome: Adequate for Discharge 03/19/2024 1152 by Oza Blumenthal I, LPN Outcome: Adequate for Discharge   Problem:  Nutrition: Goal: Adequate nutrition will be maintained 03/19/2024 1152 by Oza Blumenthal I, LPN Outcome: Adequate for Discharge 03/19/2024 1152 by Oza Blumenthal I, LPN Outcome: Adequate for Discharge   Problem: Coping: Goal: Level of anxiety will decrease 03/19/2024 1152 by Roanne Haye I, LPN Outcome: Adequate for Discharge 03/19/2024 1152 by Oza Blumenthal I, LPN Outcome: Adequate for Discharge   Problem: Elimination: Goal: Will not experience complications related to bowel motility 03/19/2024 1152 by Oza Blumenthal I, LPN Outcome: Adequate for Discharge 03/19/2024 1152 by Oza Blumenthal I, LPN Outcome: Adequate for Discharge Goal: Will not experience complications related to urinary retention 03/19/2024 1152 by Oza Blumenthal I, LPN Outcome: Adequate for Discharge 03/19/2024 1152 by Oza Blumenthal I, LPN Outcome: Adequate for Discharge   Problem: Pain Managment: Goal: General experience of comfort will improve and/or be controlled 03/19/2024 1152 by Oza Blumenthal I, LPN Outcome: Adequate for Discharge 03/19/2024 1152 by Oza Blumenthal I, LPN Outcome: Adequate for Discharge   Problem: Safety: Goal: Ability to remain free from injury will improve 03/19/2024 1152 by Emileo Semel I, LPN Outcome: Adequate for Discharge 03/19/2024 1152 by Oza Blumenthal I, LPN Outcome: Adequate for Discharge   Problem: Skin Integrity: Goal: Risk for impaired skin integrity will decrease 03/19/2024 1152 by Oza Blumenthal I, LPN Outcome: Adequate for Discharge 03/19/2024 1152 by Oza Blumenthal I, LPN Outcome: Adequate for Discharge   Problem: Education: Goal: Knowledge of the prescribed therapeutic regimen will improve 03/19/2024 1152 by Oza Blumenthal I, LPN Outcome: Adequate for Discharge 03/19/2024 1152 by Oza Blumenthal I, LPN Outcome: Adequate for Discharge Goal: Understanding of discharge needs will improve 03/19/2024 1152 by  Asta Corbridge I, LPN Outcome: Adequate for Discharge 03/19/2024 1152 by Oza Blumenthal I, LPN Outcome: Adequate for Discharge Goal: Individualized Educational Video(s) 03/19/2024 1152 by Oza Blumenthal I, LPN Outcome: Adequate for Discharge 03/19/2024 1152 by Oza Blumenthal I, LPN Outcome: Adequate for Discharge   Problem: Activity: Goal: Ability to avoid complications of  mobility impairment will improve 03/19/2024 1152 by Yaslene Lindamood I, LPN Outcome: Adequate for Discharge 03/19/2024 1152 by Oza Blumenthal I, LPN Outcome: Adequate for Discharge Goal: Ability to tolerate increased activity will improve 03/19/2024 1152 by Ketrick Matney I, LPN Outcome: Adequate for Discharge 03/19/2024 1152 by Oza Blumenthal I, LPN Outcome: Adequate for Discharge   Problem: Clinical Measurements: Goal: Postoperative complications will be avoided or minimized 03/19/2024 1152 by Kewanna Kasprzak I, LPN Outcome: Adequate for Discharge 03/19/2024 1152 by Latajah Thuman I, LPN Outcome: Adequate for Discharge   Problem: Pain Management: Goal: Pain level will decrease with appropriate interventions Outcome: Adequate for Discharge   Problem: Skin Integrity: Goal: Will show signs of wound healing Outcome: Adequate for Discharge

## 2024-03-19 NOTE — Progress Notes (Signed)
 Physical Therapy Treatment Patient Details Name: Tracy Phillips MRN: [id number] DOB: 10-Jun-1966 Today's Date: 03/19/2024   History of Present Illness Pt is s/p L anterior THA on 03/18/24.    PT Comments  Pt received upright in bed agreeable to PT. Completed HEP as listed below educating on reps/sets/frequency. Able to exit bed mod-I, stand to RW and ambulate ~300' at supervision. Completing stairs safely with education and various strategies depending on ways to enter mother's home. Educated on benefits of B rails despite having more steps due to reduced falls risk and ease of accessibility. Pt understanding returning to room with all needs in reach. Discussed LB dressing strategies as well to independently dress herself and utilization of mother for donning/doffing socks on L foot. Pt understanding with all education and goals met for d/c. Pt will benefit from d/c recs as listed to address acute deficits in gait, strength, and hip ROM.    If plan is discharge home, recommend the following: A little help with walking and/or transfers;A little help with bathing/dressing/bathroom;Assist for transportation;Assistance with cooking/housework;Help with stairs or ramp for entrance   Can travel by private vehicle        Equipment Recommendations  Rolling walker (2 wheels);BSC/3in1    Recommendations for Other Services       Precautions / Restrictions Precautions Precautions: Anterior Hip Precaution Booklet Issued: Yes (comment) Recall of Precautions/Restrictions: Intact Restrictions Weight Bearing Restrictions Per Provider Order: Yes LLE Weight Bearing Per Provider Order: Weight bearing as tolerated     Mobility  Bed Mobility Overal bed mobility: Modified Independent               Patient Response: Cooperative  Transfers Overall transfer level: Needs assistance Equipment used: Rolling walker (2 wheels) Transfers: Sit to/from Stand Sit to Stand: Supervision            General transfer comment: VC's for hand placement    Ambulation/Gait   Gait Distance (Feet): 300 Feet Assistive device: Rolling walker (2 wheels) Gait Pattern/deviations: Step-through pattern, Decreased stance time - left       General Gait Details: little use of RW completing step through gait. Consistent heel strike on LLE at initial contact and excellent foot clearance.   Stairs Stairs: Yes Stairs assistance: Modified independent (Device/Increase time) Stair Management: One rail Left, One rail Right, Step to pattern, Forwards Number of Stairs: 4 General stair comments: asc/desc with correct LE sequencing. Discussed alternate stairs at mother's house due to benefit of B rail use and ease of stair navigation.   Wheelchair Mobility     Tilt Bed Tilt Bed Patient Response: Cooperative  Modified Rankin (Stroke Patients Only)       Balance Overall balance assessment: Mild deficits observed, not formally tested                                          Communication Communication Communication: No apparent difficulties  Cognition Arousal: Alert Behavior During Therapy: WFL for tasks assessed/performed   PT - Cognitive impairments: No apparent impairments                         Following commands: Intact      Cueing Cueing Techniques: Verbal cues  Exercises Total Joint Exercises Ankle Circles/Pumps: AROM, Both, 10 reps Quad Sets: AROM, Strengthening, Left, 10 reps, Supine Gluteal Sets: AROM, Both, 10 reps,  Supine Towel Squeeze: AROM, Strengthening, Both, 10 reps, Supine Short Arc Quad: AROM, Strengthening, Left, 10 reps, Supine Hip ABduction/ADduction: AROM, Strengthening, Left, 10 reps, Supine Straight Leg Raises: AAROM, Strengthening, Left, 10 reps, Supine Long Arc Quad: AROM, Strengthening, Left, 10 reps, Seated General Exercises - Lower Extremity Hip Flexion/Marching: AROM, Strengthening, Both, 10 reps, Seated Other  Exercises Other Exercises: discussed various stair training techniques. Car transfer.    General Comments        Pertinent Vitals/Pain Pain Assessment Pain Assessment: Faces Faces Pain Scale: Hurts a little bit Pain Location: L anterior hip Pain Descriptors / Indicators: Aching, Discomfort, Grimacing Pain Intervention(s): Limited activity within patient's tolerance, Monitored during session, Repositioned    Home Living                          Prior Function            PT Goals (current goals can now be found in the care plan section) Acute Rehab PT Goals Patient Stated Goal: to improve pain and go home PT Goal Formulation: With patient Time For Goal Achievement: 04/01/24 Potential to Achieve Goals: Good Progress towards PT goals: Progressing toward goals    Frequency    BID      PT Plan      Co-evaluation              AM-PAC PT "6 Clicks" Mobility   Outcome Measure  Help needed turning from your back to your side while in a flat bed without using bedrails?: A Little Help needed moving from lying on your back to sitting on the side of a flat bed without using bedrails?: A Little Help needed moving to and from a bed to a chair (including a wheelchair)?: A Little Help needed standing up from a chair using your arms (e.g., wheelchair or bedside chair)?: A Little Help needed to walk in hospital room?: A Little Help needed climbing 3-5 steps with a railing? : A Little 6 Click Score: 18    End of Session Equipment Utilized During Treatment: Gait belt Activity Tolerance: Patient tolerated treatment well Patient left: in chair;with call bell/phone within reach;with chair alarm set Nurse Communication: Mobility status PT Visit Diagnosis: Muscle weakness (generalized) (M62.81);Difficulty in walking, not elsewhere classified (R26.2);Pain Pain - Right/Left: Left Pain - part of body: Hip     Time: 1610-9604 PT Time Calculation (min) (ACUTE ONLY): 26  min  Charges:    $Gait Training: 8-22 mins $Therapeutic Exercise: 8-22 mins PT General Charges $$ ACUTE PT VISIT: 1 Visit                     Marc Senior. Fairly IV, PT, DPT Physical Therapist- [place]  [facility]  03/19/2024, 9:50 AM

## 2024-03-19 NOTE — Progress Notes (Signed)
 Patient is not able to walk the distance required to go the bathroom, or she is unable to safely negotiate stairs required to access the bathroom.  A 3in1 BSC will alleviate this problem.       Lollie Marrow, PA-C [facility]

## 2024-03-19 NOTE — Discharge Instructions (Signed)

## 2024-03-19 NOTE — TOC Transition Note (Signed)
 Transition of Care [facility]) - Discharge Note   Patient Details  Name: Tracy Phillips MRN: [id number] Date of Birth: 1965/12/04  [facility] CM/SW Contact:  Alexandra Ice, RN Phone Number: 03/19/2024, 12:39 PM   Clinical Narrative:    Patient to discharge home today. She is scheduled for outpatient therapy per Shaun with [facility]. She declines DME per nursing, stated she will use her mom's DME. Cancelled referral with Jon at [facility]. No other TOC needs identified.    Final next level of care: OP Rehab Barriers to Discharge: Barriers Resolved   Patient Goals and CMS Choice            Discharge Placement                    Patient and family notified of of transfer: 03/19/24  Discharge Plan and Services Additional resources added to the After Visit Summary for                    DME Agency: NA       HH Arranged: NA          Social Drivers of Health (SDOH) Interventions SDOH Screenings   Food Insecurity: Food Insecurity Present (02/20/2024)  Housing: Low Risk  (02/20/2024)  Transportation Needs: No Transportation Needs (02/20/2024)  Utilities: Patient Declined (01/29/2024)   Received from [facility]  Alcohol Screen: Low Risk  (09/25/2023)  Depression (PHQ2-9): High Risk (02/11/2024)  Financial Resource Strain: Medium Risk (02/20/2024)  Physical Activity: Unknown (02/20/2024)  Social Connections: Moderately Integrated (02/20/2024)  Recent Concern: Social Connections - Moderately Isolated (01/01/2024)  Stress: Stress Concern Present (02/20/2024)  Tobacco Use: Low Risk  (03/18/2024)  Health Literacy: Adequate Health Literacy (09/25/2023)     Readmission Risk Interventions     No data to display

## 2024-03-26 DIAGNOSIS — Z96642 Presence of left artificial hip joint: Secondary | ICD-10-CM | POA: Diagnosis not present

## 2024-03-26 DIAGNOSIS — M25552 Pain in left hip: Secondary | ICD-10-CM | POA: Diagnosis not present

## 2024-03-31 DIAGNOSIS — M25552 Pain in left hip: Secondary | ICD-10-CM | POA: Diagnosis not present

## 2024-03-31 DIAGNOSIS — Z96642 Presence of left artificial hip joint: Secondary | ICD-10-CM | POA: Diagnosis not present

## 2024-04-05 DIAGNOSIS — Z96642 Presence of left artificial hip joint: Secondary | ICD-10-CM | POA: Diagnosis not present

## 2024-04-05 DIAGNOSIS — M25552 Pain in left hip: Secondary | ICD-10-CM | POA: Diagnosis not present

## 2024-04-14 ENCOUNTER — Other Ambulatory Visit: Payer: Self-pay | Admitting: Internal Medicine

## 2024-04-19 ENCOUNTER — Ambulatory Visit (INDEPENDENT_AMBULATORY_CARE_PROVIDER_SITE_OTHER): Admitting: Psychiatry

## 2024-04-19 ENCOUNTER — Other Ambulatory Visit: Payer: Self-pay

## 2024-04-19 ENCOUNTER — Encounter: Payer: Self-pay | Admitting: Psychiatry

## 2024-04-19 VITALS — BP 105/73 | HR 106 | Temp 97.8°F | Ht 63.0 in | Wt 198.6 lb

## 2024-04-19 DIAGNOSIS — F331 Major depressive disorder, recurrent, moderate: Secondary | ICD-10-CM

## 2024-04-20 ENCOUNTER — Ambulatory Visit: Admitting: Professional Counselor

## 2024-04-20 NOTE — Progress Notes (Signed)
 Patient arrived late for appointment .Patient's appointment was at 8:30 AM. Patient was advised to wait till provider was done with a patient who was already waiting in the lobby for 9 AM appointment. Few minutes past 9 AM when provider went to get patient, she was not found in the lobby. Staff advised to reschedule patient. Staff also advised to discuss clinic policy regarding being late and No show policy with patient.

## 2024-04-26 NOTE — Progress Notes (Signed)
 BH MD OP Progress Note  04/26/2024 7:54 PM Tracy Phillips  MRN:  [id number]  Chief Complaint: No chief complaint on file.  HPI: *** Visit Diagnosis: No diagnosis found.  Past Psychiatric History: I have reviewed past psychiatric history from progress note on 02/09/2024.  Behavioral health admission, B[facility] at [facility] 04/06/2023 - 04/13/2023.  She is also under the care of Dr. Aden Honor teacup or on an outpatient basis after discharge from inpatient hospitalization.  Unable to review records from Dr.Kapur since patient declined.  Past trials of medications like duloxetine , Celexa , sertraline, gabapentin   Past Medical History:  Past Medical History:  Diagnosis Date   Alcohol abuse    Anemia    Anxiety    a.) on BZO (lorazepam ) PRN   Crack cocaine use    Demand ischemia (HCC)    a. 08/2023 HsTrop 107 in setting of crack cocaine; b. 08/2023 Echo: EF of 60-65%, without regional wall motion abnormalities, normal RV size and function, mild MR, and mild-moderate TR.   Depression    Family history of adverse reaction to anesthesia    a.) PONV in 1st degree relative (mother)   GERD (gastroesophageal reflux disease)    Gestational diabetes    H/O dizziness    H/O eating disorder    Herpes    History of chicken pox    History of colon polyps    History of kidney stones    Hyperlipidemia    Vitamin B12 deficiency     Past Surgical History:  Procedure Laterality Date   ABDOMINAL HYSTERECTOMY  11/18/1996   APPENDECTOMY  11/19/1995   COLONOSCOPY     DILATION AND CURETTAGE OF UTERUS     multiple   TONSILLECTOMY  11/19/1995   TOTAL HIP ARTHROPLASTY Left 03/18/2024   Procedure: ARTHROPLASTY, HIP, TOTAL, ANTERIOR APPROACH;  Surgeon: Venus Ginsberg, MD;  Location: [facility] ORS;  Service: Orthopedics;  Laterality: Left;   WRIST FRACTURE SURGERY Left     Family Psychiatric History: I have reviewed family psychiatric history from progress note on 02/09/2024.  Family History:  Family  History  Problem Relation Age of Onset   Depression Mother    Hyperlipidemia Mother    Hypertension Mother    Breast cancer Mother 85   Dementia Father    Arthritis Father    Hyperlipidemia Father    Hypertension Father    Diabetes Father    Depression Brother    Hypertension Brother    Depression Brother    Depression Maternal Aunt    Arthritis Maternal Aunt    Hyperlipidemia Maternal Aunt    Hypertension Maternal Aunt    Arthritis Paternal Aunt    Hyperlipidemia Paternal Aunt    Hypertension Paternal Aunt    Alcohol abuse Maternal Uncle    Arthritis Maternal Uncle    Hyperlipidemia Maternal Uncle    Hypertension Maternal Uncle    Arthritis Paternal Uncle    Hyperlipidemia Paternal Uncle    Hypertension Paternal Uncle    Arthritis Maternal Grandfather    Lung cancer Maternal Grandfather    Prostate cancer Maternal Grandfather    Hyperlipidemia Maternal Grandfather    Stroke Maternal Grandfather    Hypertension Maternal Grandfather    Arthritis Maternal Grandmother    Hyperlipidemia Maternal Grandmother    Hypertension Maternal Grandmother    Alcohol abuse Paternal Grandfather    Arthritis Paternal Grandfather    Hyperlipidemia Paternal Grandfather    Hypertension Paternal Grandfather    Sudden death Paternal Actor  Arthritis Paternal Grandmother    Hyperlipidemia Paternal Grandmother    Hypertension Paternal Grandmother     Social History: I have reviewed social history from progress note on 02/09/2024. Social History   Socioeconomic History   Marital status: Divorced    Spouse name: Not on file   Number of children: 3   Years of education: Not on file   Highest education level: Bachelor's degree (e.g., BA, AB, BS)  Occupational History   Not on file  Tobacco Use   Smoking status: Never   Smokeless tobacco: Never  Vaping Use   Vaping status: Never Used  Substance and Sexual Activity   Alcohol use: No   Drug use: Not Currently    Types: "Crack"  cocaine   Sexual activity: Not Currently  Other Topics Concern   Not on file  Social History Narrative   Not on file   Social Drivers of Health   Financial Resource Strain: High Risk (03/28/2024)   Received from [facility]   Overall Financial Resource Strain (CARDIA)    Difficulty of Paying Living Expenses: Very hard  Food Insecurity: Food Insecurity Present (03/28/2024)   Received [facility]   Hunger Vital Sign    Worried About Running Out of Food in the Last Year: Sometimes true    Ran Out of Food in the Last Year: Never true  Transportation Needs: Unmet Transportation Needs (03/28/2024)   Received from [facility] - Transportation    In the past 12 months, has lack of transportation kept you from medical appointments or from getting medications?: No    Lack of Transportation (Non-Medical): Yes  Physical Activity: Unknown (02/20/2024)   Exercise Vital Sign    Days of Exercise per Week: Patient declined    Minutes of Exercise per Session: 0 min  Stress: Stress Concern Present (02/20/2024)   [employer] of Occupational Health - Occupational Stress Questionnaire    Feeling of Stress : Very much  Social Connections: Moderately Integrated (02/20/2024)   Social Connection and Isolation Panel [NHANES]    Frequency of Communication with Friends and Family: More than three times a week    Frequency of Social Gatherings with Friends and Family: Once a week    Attends Religious Services: More than 4 times per year    Active Member of [employer] or Organizations: Yes    Attends Engineer, structural: More than 4 times per year    Marital Status: Divorced  Recent Concern: Social Connections - Moderately Isolated (01/01/2024)   Social Connection and Isolation Panel [NHANES]    Frequency of Communication with Friends and Family: Once a week    Frequency of Social Gatherings with Friends and Family: Once a week     Attends Religious Services: More than 4 times per year    Active Member of [employer] or Organizations: Yes    Attends Engineer, structural: More than 4 times per year    Marital Status: Divorced    Allergies:  Allergies  Allergen Reactions   Aleve [Naproxen Sodium] Hives    Metabolic Disorder Labs: Lab Results  Component Value Date   HGBA1C 5.9 02/24/2024   MPG 111.15 09/11/2023   MPG 116.89 04/08/2023   No results found for: "PROLACTIN" Lab Results  Component Value Date   CHOL 214 (H) 09/12/2023   TRIG 153 (H) 09/12/2023   HDL 55 09/12/2023   CHOLHDL 3.9 09/12/2023   VLDL 31 09/12/2023  LDLCALC 128 (H) 09/12/2023   LDLCALC 137 (H) 04/08/2023   Lab Results  Component Value Date   TSH 3.79 02/24/2024   TSH 2.288 04/08/2023    Therapeutic Level Labs: No results found for: "LITHIUM" No results found for: "VALPROATE" No results found for: "CBMZ"  Current Medications: Current Outpatient Medications  Medication Sig Dispense Refill   acetaminophen  (TYLENOL ) 500 MG tablet Take 2 tablets (1,000 mg total) by mouth every 8 (eight) hours. 30 tablet 0   busPIRone  (BUSPAR ) 10 MG tablet TAKE 1 TABLET BY MOUTH TWICE A DAY 60 tablet 1   Cholecalciferol (D3-1000) 25 MCG (1000 UT) tablet Take 1,000 Units by mouth daily.     cyanocobalamin  ([employer] VITAMIN B12) 1000 MCG tablet Take 1,000 mcg by mouth daily.     docusate sodium  (COLACE) 100 MG capsule Take 1 capsule (100 mg total) by mouth 2 (two) times daily. 10 capsule 0   DULoxetine  (CYMBALTA ) 30 MG capsule TAKE 3 CAPSULES BY MOUTH EVERY DAY 90 capsule 0   enoxaparin  (LOVENOX ) 40 MG/0.4ML injection Inject 0.4 mLs (40 mg total) into the skin daily for 14 days. 5.6 mL 0   folic acid  (FOLVITE ) 1 MG tablet Take 1 mg by mouth daily.     LORazepam  (ATIVAN ) 0.5 MG tablet Take 0.5 mg by mouth every 8 (eight) hours as needed for anxiety.     methocarbamol  (ROBAXIN ) 750 MG tablet Take 750 mg by mouth every 8 (eight) hours as needed for  muscle spasms.  1/2-1 po tid prn     oxyCODONE  (ROXICODONE ) 5 MG immediate release tablet Take 0.5-1 tablets (2.5-5 mg total) by mouth every 6 (six) hours as needed for breakthrough pain. 20 tablet 0   pantoprazole  (PROTONIX ) 40 MG tablet TAKE 1 TABLET BY MOUTH EVERY DAY 90 tablet 1   promethazine  (PHENERGAN ) 12.5 MG tablet Take 12.5 mg by mouth every 6 (six) hours as needed for nausea or vomiting.     promethazine  (PHENERGAN ) 12.5 MG tablet Take 1 tablet (12.5 mg total) by mouth every 6 (six) hours as needed for nausea or vomiting. 30 tablet 0   QUEtiapine  (SEROQUEL ) 100 MG tablet Take 1 tablet (100 mg total) by mouth at bedtime. 90 tablet 0   traMADol  (ULTRAM ) 50 MG tablet Take 1 tablet (50 mg total) by mouth every 6 (six) hours as needed for moderate pain (pain score 4-6). 30 tablet 0   No current facility-administered medications for this visit.     Musculoskeletal: Strength & Muscle Tone: within normal limits Gait & Station: normal Patient leans: N/A  Psychiatric Specialty Exam: Review of Systems  There were no vitals taken for this visit.There is no height or weight on file to calculate BMI.  General Appearance: Casual  Eye Contact:  Fair  Speech:  Normal Rate  Volume:  Normal  Mood:  Anxious and Depressed  Affect:  Congruent  Thought Process:  Goal Directed and Descriptions of Associations: Intact  Orientation:  Full (Time, Place, and Person)  Thought Content: Logical   Suicidal Thoughts:  No  Homicidal Thoughts:  No  Memory:  Immediate;   Fair Recent;   Fair Remote;   Fair  Judgement:  Fair  Insight:  Fair  Psychomotor Activity:  Normal  Concentration:  Concentration: Fair and Attention Span: Fair  Recall:  [employer] of Knowledge: Fair  Language: Fair  Akathisia:  No  Handed:  Right  AIMS (if indicated): done  Assets:  Communication Skills Desire for Improvement Housing Social  Support Talents/Skills  ADL's:  Intact  Cognition: WNL  Sleep:  Fair    Screenings: AUDIT    Flowsheet Row [facility]  Alcohol Use Disorder Identification Test Final Score (AUDIT) 30      GAD-7    Flowsheet Row Office Visit from 02/09/2024 in [facility] Office Visit from 12/11/2023 in [facility] [place] [facility] [employer] Visit from 06/20/2023 in [facility] [place] [facility] [employer] Visit from 04/21/2023 in [facility] [place] [facility] [employer]  Total GAD-7 Score 16 15 21 21       PHQ2-9    Flowsheet Row Office Visit from 02/09/2024 in [facility] Office Visit from 12/11/2023 in [facility] [place] [facility] Patient Outreach from 09/25/2023 in  [facility]OPULATION [facility]ALTH DEPARTMENT Office Visit from 06/20/2023 in [facility] [place] [facility] [employer] Visit from 04/21/2023 in [facility] [employer]  PHQ-2 Total Score 6 4 2 6 6   PHQ-9 Total Score 24 22 4 24 24       Flowsheet Row Admission (Discharged) from 03/18/2024 in [facility] Office Visit from 02/09/2024 in [facility] ED to Hosp-Admission (Discharged) from 12/31/2023 in [facility]U  C-SSRS RISK CATEGORY No Risk No Risk No Risk        Assessment and Plan: TYSHAY ADEE is a 58 year old Caucasian female who has a history of depression, anxiety, history of alcoholism currently in remission was evaluated in office today.  Discussed assessment and plan as noted below.  Generalized anxiety disorder-unstable Significant anxiety especially in social situations.  Celexa  was tapered off and buspirone  was initiated on 02/09/2024. Continue Cymbalta  90 mg daily Continue BuSpar  10 mg twice daily.  Major depressive  disorder-unstable Patient with continued depression symptoms although with some improvement on current medication regimen. Continue Seroquel  100 mg at bedtime (EKG-QTc 452, sinus tachycardia-01/05/2024) Continue BuSpar  10 mg twice a day. Continue Cymbalta  90 mg daily.  Alcohol use disorder in early remission Patient with most recent relapse-February 2025 which led to hospitalization for alcohol withdrawal.  Previously sober for 13 years and actively involved in [place].  Currently denies any recent use. Continue AA meetings. Will reevaluate in future sessions.  Follow-up Follow-up in clinic in 3 to 4 weeks or sooner if needed.   Collaboration of Care: Collaboration of Care: {BH OP Collaboration of Care:[id number]}  Patient/Guardian was advised Release of Information must be obtained prior to any record release in order to collaborate their care with an outside provider. Patient/Guardian was advised if they have not already done so to contact the registration department to sign all necessary forms in order for us  to release information regarding their care.   Consent: Patient/Guardian gives verbal consent for treatment and assignment of benefits for services provided during this visit. Patient/Guardian expressed understanding and agreed to proceed.    Acelynn Dejonge, MD 04/26/2024, 7:54 PM

## 2024-04-27 ENCOUNTER — Ambulatory Visit (INDEPENDENT_AMBULATORY_CARE_PROVIDER_SITE_OTHER): Admitting: Psychiatry

## 2024-04-27 ENCOUNTER — Encounter: Payer: Self-pay | Admitting: Psychiatry

## 2024-04-27 VITALS — BP 124/82 | HR 90 | Temp 98.1°F | Ht 63.0 in | Wt 193.0 lb

## 2024-04-27 DIAGNOSIS — F1021 Alcohol dependence, in remission: Secondary | ICD-10-CM

## 2024-04-27 DIAGNOSIS — F1491 Cocaine use, unspecified, in remission: Secondary | ICD-10-CM

## 2024-04-27 DIAGNOSIS — F411 Generalized anxiety disorder: Secondary | ICD-10-CM

## 2024-04-27 DIAGNOSIS — F331 Major depressive disorder, recurrent, moderate: Secondary | ICD-10-CM | POA: Diagnosis not present

## 2024-04-27 MED ORDER — BUSPIRONE HCL 10 MG PO TABS
10.0000 mg | ORAL_TABLET | Freq: Two times a day (BID) | ORAL | 1 refills | Status: DC
Start: 2024-04-27 — End: 2024-08-05

## 2024-04-27 MED ORDER — QUETIAPINE FUMARATE 25 MG PO TABS: 12.5000 mg | ORAL_TABLET | ORAL | 1 refills | Status: DC

## 2024-05-15 ENCOUNTER — Other Ambulatory Visit: Payer: Self-pay | Admitting: Internal Medicine

## 2024-05-17 NOTE — Telephone Encounter (Signed)
 Patient is being followed by Dr Coby. Please refuse

## 2024-05-26 DIAGNOSIS — Z96642 Presence of left artificial hip joint: Secondary | ICD-10-CM | POA: Diagnosis not present

## 2024-05-31 ENCOUNTER — Other Ambulatory Visit: Payer: Self-pay | Admitting: Psychiatry

## 2024-05-31 DIAGNOSIS — F331 Major depressive disorder, recurrent, moderate: Secondary | ICD-10-CM

## 2024-06-01 ENCOUNTER — Telehealth: Payer: Self-pay

## 2024-06-01 ENCOUNTER — Telehealth: Admitting: Psychiatry

## 2024-06-01 DIAGNOSIS — F331 Major depressive disorder, recurrent, moderate: Secondary | ICD-10-CM

## 2024-06-01 MED ORDER — QUETIAPINE FUMARATE 100 MG PO TABS: 100.0000 mg | ORAL_TABLET | Freq: Every day | ORAL | 0 refills | Status: DC

## 2024-06-01 NOTE — Telephone Encounter (Signed)
 I have sent Seroquel  100 mg to pharmacy as requested for 7 days supply.

## 2024-06-01 NOTE — Addendum Note (Signed)
 Addended byBETHA COBY HEIGHT on: 06/01/2024 12:38 PM   Modules accepted: Orders

## 2024-06-01 NOTE — Telephone Encounter (Signed)
 Patient made aware.

## 2024-06-01 NOTE — Telephone Encounter (Signed)
 Patient called stating that while on vacation she lost her bottle of QUEtiapine  (SEROQUEL ) 100 MG tablet she went to her pharmacy they told her that they needed for the provider to send in a new Rx for the medication for a month or a week supply and the pharmacy will contact the patients pharmacy for an override patient has a visit scheduled for 06-09-24 please advise   Preferred Pharmacies   [id number] [place], [place] - [address] Phone: [phone]  Fax: [phone]

## 2024-06-02 ENCOUNTER — Other Ambulatory Visit: Payer: Self-pay | Admitting: Psychiatry

## 2024-06-02 DIAGNOSIS — F331 Major depressive disorder, recurrent, moderate: Secondary | ICD-10-CM

## 2024-06-06 ENCOUNTER — Emergency Department

## 2024-06-06 ENCOUNTER — Encounter: Payer: Self-pay | Admitting: Emergency Medicine

## 2024-06-06 ENCOUNTER — Other Ambulatory Visit: Payer: Self-pay

## 2024-06-06 DIAGNOSIS — R5381 Other malaise: Secondary | ICD-10-CM | POA: Diagnosis not present

## 2024-06-06 DIAGNOSIS — R0789 Other chest pain: Secondary | ICD-10-CM | POA: Insufficient documentation

## 2024-06-06 DIAGNOSIS — R197 Diarrhea, unspecified: Secondary | ICD-10-CM | POA: Diagnosis not present

## 2024-06-06 DIAGNOSIS — R110 Nausea: Secondary | ICD-10-CM | POA: Diagnosis not present

## 2024-06-06 DIAGNOSIS — R079 Chest pain, unspecified: Secondary | ICD-10-CM | POA: Diagnosis not present

## 2024-06-06 LAB — CBC
HCT: 44.3 % (ref 36.0–46.0)
Hemoglobin: 14.7 g/dL (ref 12.0–15.0)
MCH: 27.8 pg (ref 26.0–34.0)
MCHC: 33.2 g/dL (ref 30.0–36.0)
MCV: 83.7 fL (ref 80.0–100.0)
Platelets: 236 K/uL (ref 150–400)
RBC: 5.29 MIL/uL — ABNORMAL HIGH (ref 3.87–5.11)
RDW: 14.2 % (ref 11.5–15.5)
WBC: 5.8 K/uL (ref 4.0–10.5)
nRBC: 0 % (ref 0.0–0.2)

## 2024-06-06 LAB — BASIC METABOLIC PANEL WITH GFR
Anion gap: 10 (ref 5–15)
BUN: 6 mg/dL (ref 6–20)
CO2: 21 mmol/L — ABNORMAL LOW (ref 22–32)
Calcium: 9.3 mg/dL (ref 8.9–10.3)
Chloride: 108 mmol/L (ref 98–111)
Creatinine, Ser: 0.61 mg/dL (ref 0.44–1.00)
GFR, Estimated: >60 mL/min (ref >60)
Glucose, Bld: 105 mg/dL — ABNORMAL HIGH (ref 70–99)
Potassium: 3.7 mmol/L (ref 3.5–5.1)
Sodium: 139 mmol/L (ref 135–145)

## 2024-06-06 LAB — TROPONIN I (HIGH SENSITIVITY): Troponin I (High Sensitivity): 2 ng/L (ref <18)

## 2024-06-06 MED ORDER — ONDANSETRON 4 MG PO TBDP
4.0000 mg | ORAL_TABLET | Freq: Once | ORAL | Status: AC
  Administered 2024-06-06: 4 mg via ORAL
  Filled 2024-06-06: qty 1

## 2024-06-06 NOTE — ED Triage Notes (Signed)
 Arrived pov, ambulatory to triage for centralized CP and shob  Per pt I have had CP and shob for 2or 3 days and a low temp at home for about a week, temp 96 at home.  Hip replacement on 5/1  Pt a&ox4, able to speak in full sentences, respirations even and unlabored

## 2024-06-07 ENCOUNTER — Emergency Department
Admission: EM | Admit: 2024-06-07 | Discharge: 2024-06-07 | Disposition: A | Discharge status: Home / Self Care | Attending: Emergency Medicine | Admitting: Emergency Medicine

## 2024-06-07 DIAGNOSIS — R110 Nausea: Secondary | ICD-10-CM

## 2024-06-07 DIAGNOSIS — R5381 Other malaise: Secondary | ICD-10-CM

## 2024-06-07 DIAGNOSIS — R0789 Other chest pain: Secondary | ICD-10-CM

## 2024-06-07 MED ORDER — ONDANSETRON 4 MG PO TBDP: ORAL_TABLET | ORAL | 0 refills | Status: DC

## 2024-06-07 NOTE — Discharge Instructions (Addendum)
 Your workup in the Emergency Department today was reassuring.  We did not find any specific abnormalities.  We recommend you drink plenty of fluids, take your regular medications and/or any new ones prescribed today, and follow up with the doctor(s) listed in these documents as recommended.  Although your blood pressure was elevated tonight, this happens frequently and it likely is just a result of the circumstances.  It is very reasonable to have it rechecked and discussed your blood pressure with your primary care provider to determine if you need additional evaluation or treatment.  Return to the Emergency Department if you develop new or worsening symptoms that concern you.

## 2024-06-07 NOTE — ED Provider Notes (Signed)
 [facility] Provider Note    Event Date/Time   First MD Initiated Contact with Patient 06/07/24 0020     (approximate)   History   Chest Pain   HPI Tracy Phillips is a 58 y.o. female who presents for evaluation of several complaints.  She says that she has been having some chest pain for the last couple of days that is a dull pressure feeling.  She has also had general malaise and said that her temperature at home has been about 96 degrees for the last several days until today when it was finally normal.  When it is low, she feels clammy and chills.  She has not had any shortness of breath or cough.  She has had nausea and some diarrhea.  She states that she passed out a few hours before coming to the emergency department which convince her to finally come in.  No report of dysuria nor polyuria.     Physical Exam   Triage Vital Signs: ED Triage Vitals  Encounter Vitals Group     BP 06/06/24 2238 (!) 164/109     Girls Systolic BP Percentile --      Girls Diastolic BP Percentile --      Boys Systolic BP Percentile --      Boys Diastolic BP Percentile --      Pulse Rate 06/06/24 2238 81     Resp 06/06/24 2238 18     Temp 06/06/24 2238 98.4 F (36.9 C)     Temp Source 06/06/24 2238 Oral     SpO2 06/06/24 2238 98 %     Weight 06/06/24 2239 81.2 kg (179 lb)     Height 06/06/24 2239 1.6 m (5' 3)     Head Circumference --      Peak Flow --      Pain Score --      Pain Loc --      Pain Education --      Exclude from Growth Chart --     Most recent vital signs: Vitals:   06/06/24 2238 06/07/24 0057  BP: (!) 164/109 (!) 170/93  Pulse: 81 79  Resp: 18 20  Temp: 98.4 F (36.9 C)   SpO2: 98% 99%    General: Awake, no distress. Well-appearing. CV:  Good peripheral perfusion. Regular rate and rhythm. Resp:  Normal effort. Speaking easily and comfortably, no accessory muscle usage nor intercostal retractions.  Lungs clear to auscultation. Abd:  No  distention. No tenderness to palpation. Other:  Anxious, otherwise alert and oriented.   ED Results / Procedures / Treatments   Labs (all labs ordered are listed, but only abnormal results are displayed) Labs Reviewed  BASIC METABOLIC PANEL WITH GFR - Abnormal; Notable for the following components:      Result Value   CO2 21 (*)    Glucose, Bld 105 (*)    All other components within normal limits  CBC - Abnormal; Notable for the following components:   RBC 5.29 (*)    All other components within normal limits  TROPONIN I (HIGH SENSITIVITY)     EKG  ED ECG REPORT I, Darleene Dome, the attending physician, personally viewed and interpreted this ECG.  Date: 06/06/2024 EKG Time: 22: 44 Rate: 84 Rhythm: normal sinus rhythm QRS Axis: normal Intervals: normal ST/T Wave abnormalities: normal Narrative Interpretation: no evidence of acute ischemia    RADIOLOGY I independently viewed and interpreted the patient's two-view chest x-ray.  No evidence of pneumonia or pneumothorax or other acute abnormality.  I also read the radiologist's report, which confirmed no acute findings.   PROCEDURES:  Critical Care performed: No  Procedures    IMPRESSION / MDM / ASSESSMENT AND PLAN / ED COURSE  I reviewed the triage vital signs and the nursing notes.                              Differential diagnosis includes, but is not limited to, viral illness,   Patient's presentation is most consistent with acute presentation with potential threat to life or bodily function.  Labs/studies ordered: EKG, 2-view CXR, high-sensitivity troponin, BMP, CBC  Interventions/Medications given:  Medications  ondansetron  (ZOFRAN -ODT) disintegrating tablet 4 mg (4 mg Oral Given 06/06/24 2257)    (Note:  hospital course my include additional interventions and/or labs/studies not listed above.)   Patient is well-appearing with stable vitals other than hypertension which I suspect is likely  situational.  Labs are all reassuring, no anemia, no leukocytosis.  No ischemia on EKG, normal chest x-ray, normal troponin.  I provided reassurance, likely viral illness particular given the nausea and diarrhea but she is compensating well, labs are normal, normal temperature tonight.  She should be appropriate for discharge and outpatient follow-up with no indication for intervention.    The patient's medical screening exam is reassuring with no indication of an emergent medical condition requiring hospitalization or additional evaluation at this point.  The patient is safe and appropriate for discharge and outpatient follow up.  Patient understands and agrees with the plan.  I gave my usual and customary return precautions      FINAL CLINICAL IMPRESSION(S) / ED DIAGNOSES   Final diagnoses:  Atypical chest pain  Malaise  Nausea     Rx / DC Orders   ED Discharge Orders          Ordered    ondansetron  (ZOFRAN -ODT) 4 MG disintegrating tablet        06/07/24 0054             Note:  This document was prepared using Dragon voice recognition software and may include unintentional dictation errors.   Gordan Huxley, MD 06/07/24 [phone]

## 2024-06-09 ENCOUNTER — Telehealth: Payer: Self-pay

## 2024-06-09 ENCOUNTER — Encounter: Payer: Self-pay | Admitting: Psychiatry

## 2024-06-09 ENCOUNTER — Ambulatory Visit (INDEPENDENT_AMBULATORY_CARE_PROVIDER_SITE_OTHER): Admitting: Psychiatry

## 2024-06-09 VITALS — BP 118/78 | HR 105 | Temp 97.8°F | Ht 63.0 in | Wt 179.8 lb

## 2024-06-09 DIAGNOSIS — F411 Generalized anxiety disorder: Secondary | ICD-10-CM

## 2024-06-09 DIAGNOSIS — F1491 Cocaine use, unspecified, in remission: Secondary | ICD-10-CM | POA: Diagnosis not present

## 2024-06-09 DIAGNOSIS — F1021 Alcohol dependence, in remission: Secondary | ICD-10-CM

## 2024-06-09 DIAGNOSIS — F331 Major depressive disorder, recurrent, moderate: Secondary | ICD-10-CM

## 2024-06-09 MED ORDER — GABAPENTIN (ONCE-DAILY) 300 MG PO TABS
300.0000 mg | ORAL_TABLET | Freq: Every day | ORAL | 1 refills | Status: DC
Start: 2024-06-09 — End: 2024-08-08

## 2024-06-09 MED ORDER — DULOXETINE HCL 30 MG PO CPEP: 30.0000 mg | ORAL_CAPSULE | Freq: Every day | ORAL | Status: DC

## 2024-06-09 MED ORDER — PROPRANOLOL HCL 10 MG PO TABS: 10.0000 mg | ORAL_TABLET | Freq: Two times a day (BID) | ORAL | 1 refills | Status: DC | PRN

## 2024-06-09 MED ORDER — QUETIAPINE FUMARATE 50 MG PO TABS: 50.0000 mg | ORAL_TABLET | Freq: Every day | ORAL | 0 refills | Status: DC

## 2024-06-09 NOTE — Progress Notes (Signed)
 [facility] MD OP Progress Note  06/09/2024 11:40 AM Tracy Phillips  MRN:  [id number]  Chief Complaint:  Chief Complaint  Patient presents with   Follow-up   Depression   Anxiety   Medication Refill   Insomnia   Discussed the use of AI scribe software for clinical note transcription with the patient, who gave verbal consent to proceed.  History of Present Illness Tracy Phillips is a 58 year old Caucasian female, divorced, currently lives in [place], has a history of MDD, renal stones, hyperglycemia, hypercholesterolemia, hip pain, gastroesophageal reflux disease, history of significant use of alcohol with alcohol withdrawal symptoms in the past, generalized anxiety disorder was evaluated in office today for a follow-up appointment.  She experiences anxiety and depression, describing the anxiety as leading to depression. Symptoms include overthinking, difficulty sleeping, and panic attacks, especially before anticipated events. She feels lonesome and struggles to attend important activities, exacerbating her depression.  She reports chest pain and cold sweats, which prompted an emergency department visit on June 06, 2024.  She was cleared in the emergency department and had EKG and workup done.  She experiences low body temperature, particularly at night, with readings as low as 95.73F, accompanied by dizziness and cold sweats. These symptoms have persisted for the past few weeks.  She is currently taking Seroquel  100 mg, Cymbalta  60 mg, and Buspar  10 mg twice daily. She recently lost her Seroquel  while on vacation and missed three doses, which she believes worsened her symptoms. She has previously used Effexor , which was effective for depression and hot flashes, but discontinued due to prescription management issues. Seroquel  is not aiding her sleep and causes restlessness in her legs, described as 'toss and turn' for an hour or more.  She denies any current suicidality, homicidality or  perceptual disturbances.  She has a history of hip surgery on Mar 19, 2024, and reports her hip is doing fine. She has a history of alcoholism, with 13 years of sobriety and a recent relapse in February 2025, but is currently in early remission.  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 She experiences stress related to job searching and desires more structure in her life.   Visit Diagnosis:    ICD-10-CM   1. MDD (major depressive disorder), recurrent episode, moderate (HCC)  F33.1 DULoxetine  (CYMBALTA ) 30 MG capsule    QUEtiapine  (SEROQUEL ) 50 MG tablet    Gabapentin , Once-Daily, 300 MG TABS    propranolol  (INDERAL ) 10 MG tablet    2. Generalized anxiety disorder  F41.1 DULoxetine  (CYMBALTA ) 30 MG capsule    QUEtiapine  (SEROQUEL ) 50 MG tablet    Gabapentin , Once-Daily, 300 MG TABS    propranolol  (INDERAL ) 10 MG tablet    3. Alcohol use disorder, moderate, in early remission (HCC)  F10.21 QUEtiapine  (SEROQUEL ) 50 MG tablet   13 years of sobriety with recent relapse in February 2025 currently in early remission.    4. History of cocaine use  F14.91       Past Psychiatric History: I have reviewed past psychiatric history from progress note on 02/09/2024.  I have also reviewed past psychiatric history from my progress note on 04/27/2024.  Past Medical History:  Past Medical History:  Diagnosis Date   Alcohol abuse    Anemia    Anxiety    a.) on BZO (lorazepam ) PRN   Crack cocaine use    Demand ischemia (HCC)    a.  08/2023 HsTrop 107 in setting of crack cocaine; b. 08/2023 Echo: EF of 60-65%, without regional wall motion abnormalities, normal RV size and function, mild MR, and mild-moderate TR.   Depression    Family history of adverse reaction to anesthesia    a.) PONV in 1st degree relative (mother)   GERD (gastroesophageal reflux disease)    Gestational diabetes    H/O dizziness    H/O eating disorder     Herpes    History of chicken pox    History of colon polyps    History of kidney stones    Hyperlipidemia    Vitamin B12 deficiency     Past Surgical History:  Procedure Laterality Date   ABDOMINAL HYSTERECTOMY  11/18/1996   APPENDECTOMY  11/19/1995   COLONOSCOPY     DILATION AND CURETTAGE OF UTERUS     multiple   TONSILLECTOMY  11/19/1995   TOTAL HIP ARTHROPLASTY Left 03/18/2024   Procedure: ARTHROPLASTY, HIP, TOTAL, ANTERIOR APPROACH;  Surgeon: Lorelle Hussar, MD;  Location: [facility] ORS;  Service: Orthopedics;  Laterality: Left;   WRIST FRACTURE SURGERY Left     Family Psychiatric History: Have reviewed family psychiatric history from progress note on 02/09/2024  Family History:  Family History  Problem Relation Age of Onset   Depression Mother    Hyperlipidemia Mother    Hypertension Mother    Breast cancer Mother 26   Dementia Father    Arthritis Father    Hyperlipidemia Father    Hypertension Father    Diabetes Father    Depression Brother    Hypertension Brother    Depression Brother    Depression Maternal Aunt    Arthritis Maternal Aunt    Hyperlipidemia Maternal Aunt    Hypertension Maternal Aunt    Arthritis Paternal Aunt    Hyperlipidemia Paternal Aunt    Hypertension Paternal Aunt    Alcohol abuse Maternal Uncle    Arthritis Maternal Uncle    Hyperlipidemia Maternal Uncle    Hypertension Maternal Uncle    Arthritis Paternal Uncle    Hyperlipidemia Paternal Uncle    Hypertension Paternal Uncle    Arthritis Maternal Grandfather    Lung cancer Maternal Grandfather    Prostate cancer Maternal Grandfather    Hyperlipidemia Maternal Grandfather    Stroke Maternal Grandfather    Hypertension Maternal Grandfather    Arthritis Maternal Grandmother    Hyperlipidemia Maternal Grandmother    Hypertension Maternal Grandmother    Alcohol abuse Paternal Grandfather    Arthritis Paternal Grandfather    Hyperlipidemia Paternal Grandfather    Hypertension Paternal  Grandfather    Sudden death Paternal Grandfather    Arthritis Paternal Grandmother    Hyperlipidemia Paternal Grandmother    Hypertension Paternal Grandmother     Social History: I have reviewed social history from progress note on 02/09/2024 Social History   Socioeconomic History   Marital status: Divorced    Spouse name: Not on file   Number of children: 3   Years of education: Not on file   Highest education level: Bachelor's degree (e.g., BA, AB, BS)  Occupational History   Not on file  Tobacco Use   Smoking status: Never   Smokeless tobacco: Never  Vaping Use   Vaping status: Never Used  Substance and Sexual Activity   Alcohol use: No   Drug use: Not Currently    Types: Crack cocaine   Sexual activity: Not Currently  Other Topics Concern   Not on file  Social  History Narrative   Not on file   Social Drivers of Health   Financial Resource Strain: High Risk (05/26/2024)   Received from [facility]   Overall Financial Resource Strain (CARDIA)    Difficulty of Paying Living Expenses: Very hard  Food Insecurity: Food Insecurity Present (05/26/2024)   [facility]   Hunger Vital Sign    Within the past 12 months, you worried that your food would run out before you got the money to buy more.: Sometimes true    Within the past 12 months, the food you bought just didn't last and you didn't have money to get more.: Never true  Transportation Needs: No Transportation Needs (05/26/2024)   Received from [facility] - Transportation    In the past 12 months, has lack of transportation kept you from medical appointments or from getting medications?: No    Lack of Transportation (Non-Medical): No  Recent Concern: Transportation Needs - Unmet Transportation Needs (03/28/2024)   [facility]    In the past 12 months, has lack of transportation kept  you from medical appointments or from getting medications?: No    Lack of Transportation (Non-Medical): Yes  Physical Activity: Unknown (02/20/2024)   Exercise Vital Sign    Days of Exercise per Week: Patient declined    Minutes of Exercise per Session: 0 min  Stress: Stress Concern Present (02/20/2024)   [employer] of Occupational Health - Occupational Stress Questionnaire    Feeling of Stress : Very much  Social Connections: Moderately Integrated (02/20/2024)   Social Connection and Isolation Panel    Frequency of Communication with Friends and Family: More than three times a week    Frequency of Social Gatherings with Friends and Family: Once a week    Attends Religious Services: More than 4 times per year    Active Member of [employer] or Organizations: Yes    Attends Engineer, structural: More than 4 times per year    Marital Status: Divorced  Recent Concern: Social Connections - Moderately Isolated (01/01/2024)   Social Connection and Isolation Panel    Frequency of Communication with Friends and Family: Once a week    Frequency of Social Gatherings with Friends and Family: Once a week    Attends Religious Services: More than 4 times per year    Active Member of [employer] or Organizations: Yes    Attends Engineer, structural: More than 4 times per year    Marital Status: Divorced    Allergies:  Allergies  Allergen Reactions   Aleve [Naproxen Sodium] Hives    Metabolic Disorder Labs: Lab Results  Component Value Date   HGBA1C 5.9 02/24/2024   MPG 111.15 09/11/2023   MPG 116.89 04/08/2023   No results found for: PROLACTIN Lab Results  Component Value Date   CHOL 214 (H) 09/12/2023   TRIG 153 (H) 09/12/2023   HDL 55 09/12/2023   CHOLHDL 3.9 09/12/2023   VLDL 31 09/12/2023   LDLCALC 128 (H) 09/12/2023   LDLCALC 137 (H) 04/08/2023   Lab Results  Component Value Date   TSH 3.79 02/24/2024   TSH 2.288 04/08/2023    Therapeutic Level Labs: No results  found for: LITHIUM No results found for: VALPROATE No results found for: CBMZ  Current Medications: Current Outpatient Medications  Medication Sig Dispense Refill   acetaminophen  (TYLENOL ) 500 MG tablet Take  2 tablets (1,000 mg total) by mouth every 8 (eight) hours. 30 tablet 0   busPIRone  (BUSPAR ) 10 MG tablet Take 1 tablet (10 mg total) by mouth 2 (two) times daily. 60 tablet 1   celecoxib  (CELEBREX ) 200 MG capsule Take 200 mg by mouth.     Cholecalciferol (D3-1000) 25 MCG (1000 UT) tablet Take 1,000 Units by mouth daily.     cyanocobalamin  ([employer] VITAMIN B12) 1000 MCG tablet Take 1,000 mcg by mouth daily.     folic acid  (FOLVITE ) 1 MG tablet Take 1 mg by mouth daily.     Gabapentin , Once-Daily, 300 MG TABS Take 1 tablet (300 mg total) by mouth at bedtime. 30 tablet 1   methocarbamol  (ROBAXIN ) 500 MG tablet Take 500 mg by mouth.     ondansetron  (ZOFRAN -ODT) 4 MG disintegrating tablet Allow 1-2 tablets to dissolve in your mouth every 8 hours as needed for nausea/vomiting 30 tablet 0   pantoprazole  (PROTONIX ) 40 MG tablet TAKE 1 TABLET BY MOUTH EVERY DAY 90 tablet 1   promethazine  (PHENERGAN ) 12.5 MG tablet Take 1 tablet (12.5 mg total) by mouth every 6 (six) hours as needed for nausea or vomiting. 30 tablet 0   propranolol  (INDERAL ) 10 MG tablet Take 1 tablet (10 mg total) by mouth 2 (two) times daily as needed. For severe anxiety attacks only 60 tablet 1   QUEtiapine  (SEROQUEL ) 50 MG tablet Take 1 tablet (50 mg total) by mouth at bedtime. Stop the 100 mg 30 tablet 0   DULoxetine  (CYMBALTA ) 30 MG capsule Take 1 capsule (30 mg total) by mouth daily. Reduced dose with plan to taper off     No current facility-administered medications for this visit.     Musculoskeletal: Strength & Muscle Tone: within normal limits Gait & Station: normal Patient leans: N/A  Psychiatric Specialty Exam: Review of Systems  Psychiatric/Behavioral:  Positive for decreased concentration, dysphoric mood  and sleep disturbance. The patient is nervous/anxious.     Blood pressure 118/78, pulse (!) 105, temperature 97.8 F (36.6 C), temperature source Temporal, height 5' 3 (1.6 m), weight 179 lb 12.8 oz (81.6 kg), SpO2 94%.Body mass index is 31.85 kg/m.  General Appearance: Casual  Eye Contact:  Fair  Speech:  Clear and Coherent  Volume:  Normal  Mood:  Anxious and Depressed  Affect:  Congruent  Thought Process:  Goal Directed and Descriptions of Associations: Intact  Orientation:  Full (Time, Place, and Person)  Thought Content: Logical   Suicidal Thoughts:  No  Homicidal Thoughts:  No  Memory:  Immediate;   Fair Recent;   Fair Remote;   Fair  Judgement:  Fair  Insight:  Fair  Psychomotor Activity:  Normal  Concentration:  Concentration: Fair and Attention Span: Fair  Recall:  [employer] of Knowledge: Fair  Language: Fair  Akathisia:  No  Handed:  Right  AIMS (if indicated): Denies any tremors rigidity stiffness  Assets:  Communication Skills Desire for Improvement Housing Social Support Transportation  ADL's:  Intact  Cognition: WNL  Sleep:  Poor   Screenings: AIMS    Flowsheet Row Office Visit from 04/27/2024 in [facility]  AIMS Total Score 0   AUDIT    Flowsheet Row [facility]  Alcohol Use Disorder Identification Test Final Score (AUDIT) 30   GAD-7    Flowsheet Row Office Visit from 06/09/2024 in [facility] Office Visit from  04/27/2024 in [facility] Office Visit from 02/09/2024 in [facility] Office Visit from 12/11/2023 in [facility] Visit from 06/20/2023 in [facility] [facility]  Total GAD-7 Score 18 19 16 15 21    PHQ2-9    Flowsheet Row Office  Visit from 06/09/2024 in [facility] Office Visit from 04/27/2024 in [facility] Office Visit from 02/09/2024 in [facility] Office Visit from 12/11/2023 in [facility] Patient Outreach from 09/25/2023 in [place] [facility]  PHQ-2 Total Score 4 2 6 4 2   PHQ-9 Total Score 15 12 24 22 4    Flowsheet Row Office Visit from 06/09/2024 in [facility] ED from 06/07/2024 in [facility] Department at [facility] Visit from 04/27/2024 in [facility]  C-SSRS RISK CATEGORY Moderate Risk No Risk Moderate Risk     Assessment and Plan: KAILAH PENNEL is a 58 year old Caucasian female who has a history of depression, anxiety, history of alcoholism currently in remission was evaluated in office today for a follow-up appointment.  Discussed assessment and plan as noted below.  Generalized anxiety disorder-unstable Currently with significant anxiety and anxiety attacks especially anticipatory anxiety regarding going out in public or social situations.  May have done well on Effexor  previously.  Also notes that gabapentin  might have helped as well in the past.  Motivated to start psychotherapy sessions, has upcoming appointment scheduled. Reduce Cymbalta ,( currently reports taking only 60 mg daily although prescribed as 90 mg ) to 30 mg daily. Plan to taper this medication off and addition of Effexor  in the future. Continue BuSpar  10 mg twice a day Start Gabapentin  300 mg at bedtime. Start Propranolol  10 mg twice a day as needed for severe anxiety attacks only Encouraged to start psychotherapy sessions with Ms. Rhae  Major depressive disorder-unstable Currently with significant depression symptoms exacerbated by anxiety attacks.  Not  interested in continuing Seroquel  due to side effects as well as restlessness at night. Reduce Seroquel  to 50 mg at bedtime with plan to taper it off. Add Gabapentin  300 mg at bedtime Reviewed [place] PMP AWARxE Plan to start Effexor  in the future. Will start reducing Cymbalta  to 30 mg daily with plan to taper it off.  Alcohol use disorder in early remission Currently continues to stay away from alcohol last use was in February 2025.  Previously sober for 13 years. Continue AA meetings as needed Encourage abstinence.  Reviewed notes per emergency department physician-Dr. Darleene Dome dated 06/07/2024-likely viral illness particular given the nausea and diarrhea, patient was medically cleared and released.   Follow-up Follow-up in clinic in 1 week or sooner if needed.  Collaboration of Care: Collaboration of Care: Referral or follow-up with counselor/therapist AEB encouraged to continue psychotherapy sessions, has upcoming appointment scheduled.  Patient/Guardian was advised Release of Information must be obtained prior to any record release in order to collaborate their care with an outside provider. Patient/Guardian was advised if they have not already done so to contact the registration department to sign all necessary forms in order for us  to release information regarding their care.   Consent: Patient/Guardian gives verbal consent for treatment and assignment of benefits for services provided during this visit. Patient/Guardian expressed understanding and agreed to proceed.   This note was generated  in part or whole with voice recognition software. Voice recognition is usually quite accurate but there are transcription errors that can and very often do occur. I apologize for any typographical errors that were not detected and corrected.    Mileena Rothenberger, MD 06/09/2024, 11:40 AM

## 2024-06-09 NOTE — Telephone Encounter (Signed)
 Patient called stating that the Gabapentin , Once-Daily, 300 MG TABS  is not covered with her insurance they will cover if its not the extended release please advise

## 2024-06-09 NOTE — Patient Instructions (Signed)
 Propranolol  Tablets What is this medication? PROPRANOLOL  (proe PRAN oh lole) treats many conditions such as high blood pressure, tremors, and a type of arrhythmia known as AFib (atrial fibrillation). It works by lowering your blood pressure and heart rate, making it easier for your heart to pump blood to the rest of your body. It may be used to prevent migraine headaches. It works by relaxing the blood vessels in the brain that cause migraines. It belongs to a group of medications called beta blockers. This medicine may be used for other purposes; ask your health care provider or pharmacist if you have questions. COMMON BRAND NAME(S): Inderal  What should I tell my care team before I take this medication? They need to know if you have any of these conditions: Diabetes Having surgery Heart or blood vessel conditions, such as slow heartbeat, heart failure, heart block Kidney disease Liver disease Lung or breathing disease, such as asthma or COPD Myasthenia gravis Pheochromocytoma Thyroid  disease An unusual or allergic reaction to propranolol , other medications, foods, dyes, or preservatives Pregnant or trying to get pregnant Breastfeeding How should I use this medication? Take this medication by mouth. Take it as directed on the prescription label at the same time every day. Keep taking it unless your care team tells you to stop. Talk to your care team about the use of this medication in children. Special care may be needed. Overdosage: If you think you have taken too much of this medicine contact a poison control center or emergency room at once. NOTE: This medicine is only for you. Do not share this medicine with others. What if I miss a dose? If you miss a dose, take it as soon as you can. If it is almost time for your next dose, take only that dose. Do not take double or extra doses. What may interact with this medication? Do not take this medication with any of the  following: Thioridazine This medication may also interact with the following: Certain medications for blood pressure, heart disease, irregular heartbeat Epinephrine  NSAIDs, medications for pain and inflammation, such as ibuprofen or naproxen Warfarin Other medications may affect the way this medication works. Talk with your care team about all of the medications you take. They may suggest changes to your treatment plan to lower the risk of side effects and to make sure your medications work as intended. This list may not describe all possible interactions. Give your health care provider a list of all the medicines, herbs, non-prescription drugs, or dietary supplements you use. Also tell them if you smoke, drink alcohol, or use illegal drugs. Some items may interact with your medicine. What should I watch for while using this medication? Visit your care team for regular checks on your progress. Check your blood pressure as directed. Know what your blood pressure should be and when to contact your care team. This medication may affect your coordination, reaction time, or judgment. Do not drive or operate machinery until you know how this medication affects you. Sit up or stand slowly to reduce the risk of dizzy or fainting spells. Drinking alcohol with this medication can increase the risk of these side effects. Do not suddenly stop taking this medication. This may increase your risk of side effects, such as chest pain and heart attack. If you no longer need to take this medication, your care team will lower the dose slowly over time to decrease the risk of side effects. If you are going to need surgery or a  procedure, tell your care team that you are using this medication. This medication may affect blood glucose levels. It can also mask the symptoms of low blood sugar, such as a rapid heartbeat and tremors. If you have diabetes, it is important to check your blood sugar often while you are taking this  medication. Do not treat yourself for coughs, colds, or pain while you are using this medication without asking your care team for advice. Some medications may increase your blood pressure. What side effects may I notice from receiving this medication? Side effects that you should report to your care team as soon as possible: Allergic reactions--skin rash, itching, hives, swelling of the face, lips, tongue, or throat Heart failure--shortness of breath, swelling of the ankles, feet, or hands, sudden weight gain, unusual weakness or fatigue Low blood pressure--dizziness, feeling faint or lightheaded, blurry vision Raynaud's--cool, numb, or painful fingers or toes that may change color from pale, to blue, to red Redness, blistering, peeling, or loosening of the skin, including inside the mouth Slow heartbeat--dizziness, feeling faint or lightheaded, confusion, trouble breathing, unusual weakness or fatigue Worsening mood, feelings of depression Side effects that usually do not require medical attention (report to your care team if they continue or are bothersome): Change in sex drive or performance Diarrhea Dizziness Fatigue Headache This list may not describe all possible side effects. Call your doctor for medical advice about side effects. You may report side effects to FDA at 1-800-FDA-1088. Where should I keep my medication? Keep out of the reach of children and pets. Store at room temperature between 20 and 25 degrees C (68 and 77 degrees F). Protect from light. Throw away any unused medication after the expiration date. NOTE: This sheet is a summary. It may not cover all possible information. If you have questions about this medicine, talk to your doctor, pharmacist, or health care provider.  2024 Elsevier/Gold Standard (2022-11-04 00:00:00)Gabapentin  Capsules or Tablets What is this medication? GABAPENTIN  (GA ba pen tin) treats nerve pain. It may also be used to prevent and control  seizures in people with epilepsy. It works by calming overactive nerves in your body. This medicine may be used for other purposes; ask your health care provider or pharmacist if you have questions. COMMON BRAND NAME(S): Active-PAC with Gabapentin , Gabarone , Gralise , Neurontin  What should I tell my care team before I take this medication? They need to know if you have any of these conditions: Kidney disease Lung or breathing disease Substance use disorder Suicidal thoughts, plans, or attempt by you or a family member An unusual or allergic reaction to gabapentin , other medications, foods, dyes, or preservatives Pregnant or trying to get pregnant Breastfeeding How should I use this medication? Take this medication by mouth with a glass of water. Follow the directions on the prescription label. You can take it with or without food. If it upsets your stomach, take it with food. Take your medication at regular intervals. Do not take it more often than directed. Do not stop taking except on your care team's advice. If you are directed to break the 600 or 800 mg tablets in half as part of your dose, the extra half tablet should be used for the next dose. If you have not used the extra half tablet within 28 days, it should be thrown away. A special MedGuide will be given to you by the pharmacist with each prescription and refill. Be sure to read this information carefully each time. Talk to your care team  about the use of this medication in children. While this medication may be prescribed for children as young as 3 years for selected conditions, precautions do apply. Overdosage: If you think you have taken too much of this medicine contact a poison control center or emergency room at once. NOTE: This medicine is only for you. Do not share this medicine with others. What if I miss a dose? If you miss a dose, take it as soon as you can. If it is almost time for your next dose, take only that dose. Do not  take double or extra doses. What may interact with this medication? Alcohol Antihistamines for allergy, cough, and cold Certain medications for anxiety or sleep Certain medications for depression like amitriptyline, fluoxetine, sertraline Certain medications for seizures like phenobarbital , primidone Certain medications for stomach problems General anesthetics like halothane, isoflurane, methoxyflurane, propofol  Local anesthetics like lidocaine , pramoxine, tetracaine  Medications that relax muscles for surgery Opioid medications for pain Phenothiazines like chlorpromazine, mesoridazine, prochlorperazine , thioridazine This list may not describe all possible interactions. Give your health care provider a list of all the medicines, herbs, non-prescription drugs, or dietary supplements you use. Also tell them if you smoke, drink alcohol, or use illegal drugs. Some items may interact with your medicine. What should I watch for while using this medication? Visit your care team for regular checks on your progress. You may want to keep a record at home of how you feel your condition is responding to treatment. You may want to share this information with your care team at each visit. You should contact your care team if your seizures get worse or if you have any new types of seizures. Do not stop taking this medication or any of your seizure medications unless instructed by your care team. Stopping your medication suddenly can increase your seizures or their severity. This medication may cause serious skin reactions. They can happen weeks to months after starting the medication. Contact your care team right away if you notice fevers or flu-like symptoms with a rash. The rash may be red or purple and then turn into blisters or peeling of the skin. Or, you might notice a red rash with swelling of the face, lips or lymph nodes in your neck or under your arms. Wear a medical identification bracelet or chain if  you are taking this medication for seizures. Carry a card that lists all your medications. This medication may affect your coordination, reaction time, or judgment. Do not drive or operate machinery until you know how this medication affects you. Sit up or stand slowly to reduce the risk of dizzy or fainting spells. Drinking alcohol with this medication can increase the risk of these side effects. Your mouth may get dry. Chewing sugarless gum or sucking hard candy, and drinking plenty of water may help. Watch for new or worsening thoughts of suicide or depression. This includes sudden changes in mood, behaviors, or thoughts. These changes can happen at any time but are more common in the beginning of treatment or after a change in dose. Call your care team right away if you experience these thoughts or worsening depression. If you become pregnant while using this medication, you may enroll in the [place] [employer] Antiepileptic Drug Pregnancy Registry by calling [phone]. This registry collects information about the safety of antiepileptic medication use during pregnancy. What side effects may I notice from receiving this medication? Side effects that you should report to your care team as soon as possible: Allergic reactions  or angioedema--skin rash, itching, hives, swelling of the face, eyes, lips, tongue, arms, or legs, trouble swallowing or breathing Rash, fever, and swollen lymph nodes Thoughts of suicide or self harm, worsening mood, feelings of depression Trouble breathing Unusual changes in mood or behavior in children after use such as difficulty concentrating, hostility, or restlessness Side effects that usually do not require medical attention (report to your care team if they continue or are bothersome): Dizziness Drowsiness Nausea Swelling of ankles, feet, or hands Vomiting This list may not describe all possible side effects. Call your doctor for medical advice about side effects.  You may report side effects to FDA at 1-800-FDA-1088. Where should I keep my medication? Keep out of reach of children and pets. Store at room temperature between 15 and 30 degrees C (59 and 86 degrees F). Get rid of any unused medication after the expiration date. This medication may cause accidental overdose and death if taken by other adults, children, or pets. To get rid of medications that are no longer needed or have expired: Take the medication to a medication take-back program. Check with your pharmacy or law enforcement to find a location. If you cannot return the medication, check the label or package insert to see if the medication should be thrown out in the garbage or flushed down the toilet. If you are not sure, ask your care team. If it is safe to put it in the trash, empty the medication out of the container. Mix the medication with cat litter, dirt, coffee grounds, or other unwanted substance. Seal the mixture in a bag or container. Put it in the trash. NOTE: This sheet is a summary. It may not cover all possible information. If you have questions about this medicine, talk to your doctor, pharmacist, or health care provider.  2024 Elsevier/Gold Standard (2022-08-20 00:00:00)

## 2024-06-10 ENCOUNTER — Other Ambulatory Visit: Payer: Self-pay | Admitting: Psychiatry

## 2024-06-10 DIAGNOSIS — F331 Major depressive disorder, recurrent, moderate: Secondary | ICD-10-CM

## 2024-06-10 DIAGNOSIS — F411 Generalized anxiety disorder: Secondary | ICD-10-CM

## 2024-06-10 MED ORDER — GABAPENTIN 300 MG PO CAPS: 300.0000 mg | ORAL_CAPSULE | Freq: Every day | ORAL | 1 refills | Status: DC

## 2024-06-10 NOTE — Addendum Note (Signed)
 Addended byBETHA COBY HEIGHT on: 06/10/2024 05:55 PM   Modules accepted: Orders

## 2024-06-10 NOTE — Telephone Encounter (Signed)
I have sent gabapentin to pharmacy. 

## 2024-06-11 NOTE — Telephone Encounter (Signed)
 Called patient to inform that the Gabapentin  has been sent to her pharmacy no answer left voicemail for patient to return call to office

## 2024-06-18 ENCOUNTER — Telehealth: Admitting: Psychiatry

## 2024-06-22 ENCOUNTER — Other Ambulatory Visit: Payer: Self-pay | Admitting: Psychiatry

## 2024-06-22 ENCOUNTER — Telehealth: Payer: Self-pay

## 2024-06-22 DIAGNOSIS — F331 Major depressive disorder, recurrent, moderate: Secondary | ICD-10-CM

## 2024-06-22 DIAGNOSIS — F411 Generalized anxiety disorder: Secondary | ICD-10-CM

## 2024-06-22 MED ORDER — DULOXETINE HCL 30 MG PO CPEP: 30.0000 mg | ORAL_CAPSULE | Freq: Every day | ORAL | 0 refills | Status: DC

## 2024-06-22 NOTE — Telephone Encounter (Signed)
 Ordered

## 2024-06-22 NOTE — Telephone Encounter (Signed)
 Left message that rx was sent to the pharmacy

## 2024-06-22 NOTE — Telephone Encounter (Signed)
 According to Dr. Senora note: "Reduce Cymbalta  (currently reports taking only 60 mg daily although prescribed as 90 mg) to 30 mg daily. Plan to taper this medication off and add Effexor  in the future." Please advise the patient to continue taking duloxetine  at the current dose until the next visit with Dr. Coby, unless otherwise directed.

## 2024-06-22 NOTE — Telephone Encounter (Signed)
 pt states she is out of the cymbalta . will need refill.

## 2024-06-22 NOTE — Telephone Encounter (Signed)
 pt left a message that she needed the rx for the effexor  to be sent to the pharamcy. she stated that at her last visit 7-23 dr coby tappered her off the cymbalta  and once ended start the effexor  but a rx did not get sent. pt was last seen on 7-23 next appt 8-29

## 2024-06-29 ENCOUNTER — Ambulatory Visit (INDEPENDENT_AMBULATORY_CARE_PROVIDER_SITE_OTHER): Admitting: Professional Counselor

## 2024-06-29 DIAGNOSIS — F331 Major depressive disorder, recurrent, moderate: Secondary | ICD-10-CM | POA: Diagnosis not present

## 2024-06-29 DIAGNOSIS — F1021 Alcohol dependence, in remission: Secondary | ICD-10-CM | POA: Diagnosis not present

## 2024-06-29 DIAGNOSIS — F411 Generalized anxiety disorder: Secondary | ICD-10-CM
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
## 2024-07-01 ENCOUNTER — Other Ambulatory Visit: Payer: Self-pay | Admitting: Psychiatry

## 2024-07-01 ENCOUNTER — Telehealth: Payer: Self-pay | Admitting: Internal Medicine

## 2024-07-01 DIAGNOSIS — F331 Major depressive disorder, recurrent, moderate: Secondary | ICD-10-CM

## 2024-07-01 DIAGNOSIS — F411 Generalized anxiety disorder: Secondary | ICD-10-CM

## 2024-07-01 NOTE — Telephone Encounter (Addendum)
 Pt dropped off employment health assessment to be filled out by Dr Glendia. Paper needs to be back to employer within the next 30 days, but pt will be here for an appt on Monday and will grab the form then if it is ready. It is in Dr [employer] color folder up front -[facility]

## 2024-07-01 NOTE — Telephone Encounter (Signed)
 Pt scheduled for 9/9 to have form completed.

## 2024-07-05 ENCOUNTER — Ambulatory Visit (INDEPENDENT_AMBULATORY_CARE_PROVIDER_SITE_OTHER)

## 2024-07-05 DIAGNOSIS — Z23 Encounter for immunization: Secondary | ICD-10-CM | POA: Diagnosis not present

## 2024-07-05 NOTE — Progress Notes (Signed)
 PPD Placement note Tracy Phillips, 58 y.o. female is here today for placement of PPD test Reason for PPD test: work Pt taken PPD test before: yes Verified in allergy area and with patient that they are not allergic to the products PPD is made of (Phenol or Tween yes   Is patient taking any oral or IV steroid medication now or have they taken it in the last month? no Has the patient ever received the BCG vaccine?: no Has the patient been in recent contact with anyone known or suspected of having active TB disease?: no      Date of exposure (if applicable): na      Name of person they were exposed to (if applicable): na Patient's Country of origin?: na O: Alert and oriented in NAD. P:  PPD placed on 07/05/2024.  Patient advised to return for reading within 48-72 hours.

## 2024-07-07 ENCOUNTER — Telehealth: Payer: Self-pay

## 2024-07-07 NOTE — Telephone Encounter (Signed)
 pt left a message that she is not doing well. she is having crying spells. she states that you taper down the cymbalta  to 30mg  and was suppose to start the effexor  but she states that she been crying alot and for no reason. she was last seen on 7-23 next appt 8-29

## 2024-07-07 NOTE — Telephone Encounter (Signed)
 She will need an appointment for further evaluation and management of medications.  However if she is in a crisis please let her know to go to the nearest urgent care.  Patient to be placed on a priority list to be seen sooner.  She however does have an appointment coming up on 07/16/2024.

## 2024-07-07 NOTE — Telephone Encounter (Signed)
 Yes in person and she needs to be on time .

## 2024-07-08 ENCOUNTER — Other Ambulatory Visit: Payer: Self-pay

## 2024-07-08 ENCOUNTER — Ambulatory Visit

## 2024-07-08 ENCOUNTER — Encounter: Payer: Self-pay | Admitting: Psychiatry

## 2024-07-08 ENCOUNTER — Ambulatory Visit (INDEPENDENT_AMBULATORY_CARE_PROVIDER_SITE_OTHER): Admitting: Psychiatry

## 2024-07-08 VITALS — BP 138/86 | HR 85 | Temp 96.8°F | Ht 63.0 in | Wt 180.8 lb

## 2024-07-08 DIAGNOSIS — F411 Generalized anxiety disorder: Secondary | ICD-10-CM

## 2024-07-08 DIAGNOSIS — F331 Major depressive disorder, recurrent, moderate: Secondary | ICD-10-CM | POA: Diagnosis not present

## 2024-07-08 DIAGNOSIS — F1491 Cocaine use, unspecified, in remission: Secondary | ICD-10-CM

## 2024-07-08 DIAGNOSIS — F1021 Alcohol dependence, in remission: Secondary | ICD-10-CM | POA: Diagnosis not present

## 2024-07-08 MED ORDER — DULOXETINE HCL 30 MG PO CPEP: 30.0000 mg | ORAL_CAPSULE | ORAL | Status: DC

## 2024-07-08 MED ORDER — VENLAFAXINE HCL ER 37.5 MG PO CP24: 37.5000 mg | ORAL_CAPSULE | Freq: Every day | ORAL | 0 refills | Status: DC

## 2024-07-08 NOTE — Progress Notes (Unsigned)
 [facility] MD OP Progress Note  07/08/2024 3:31 PM Tracy Phillips  MRN:  [id number]  Chief Complaint:  Chief Complaint  Patient presents with   Follow-up   Depression   Anxiety   Medication Refill   Discussed the use of AI scribe software for clinical note transcription with the patient, who gave verbal consent to proceed.  History of Present Illness Tracy Phillips is a 58 year old Caucasian female, divorced, currently lives in [place], has a history of MDD, GAD, renal stones, hyperglycemia, hypercholesterolemia, hip pain, gastroesophageal reflux disease, history of significant use of alcohol with alcohol withdrawal symptoms in the past, was evaluated in office today.  Patient contacted the office reporting worsening mood symptoms and this appointment was scheduled on an urgent basis.  She reports ongoing mood symptoms, including crying spells, have persisted for over a month, which she reports began after tapering off Cymbalta . She reports increased emotional sensitivity leads her to cry frequently, and she links these symptoms to medication changes. She reports anxiety has increased, which she connects to recent life changes, starting a new job, and exposure to heat. She describes increased sweating, which she relates to menopause, heat, and medications. She denies paranoia and other psychotic symptoms.  Regarding sleep, she describes it as adequate, typically falling asleep by 9 PM and waking at 5 AM, and notes that gabapentin  has improved her sleep quality. She reports appetite remains regular, and she states she is eating well.  She was able to discontinue Seroquel  as discussed last visit.  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 She describes needing to rest after work and is adapting to a new schedule. She is currently seeing a therapist, has attended 1 session, and plans to continue  therapy, though she anticipates needing to adjust appointments due to her new work schedule.  She denies any thoughts of harming herself or others.   She previously took venlafaxine  for depression and hot flashes with good effect, but discontinued it due to prescription management issues.  She is interested in retrial of venlafaxine .  She continues to be motivated to stay in psychotherapy with Ms. Veva however would like her appointments to be changed to virtual due to her recently starting a new job.      Visit Diagnosis:    ICD-10-CM   1. MDD (major depressive disorder), recurrent episode, moderate (HCC)  F33.1 DULoxetine  (CYMBALTA ) 30 MG capsule    venlafaxine  XR (EFFEXOR  XR) 37.5 MG 24 hr capsule    2. Generalized anxiety disorder  F41.1 DULoxetine  (CYMBALTA ) 30 MG capsule    venlafaxine  XR (EFFEXOR  XR) 37.5 MG 24 hr capsule    3. Alcohol use disorder, moderate, in early remission (HCC)  F10.21    13 years of sobriety with recent relapse in February 2025 currently in early remission    4. History of cocaine use  F14.91       Past Psychiatric History: I have reviewed past psychiatric history from progress note on 02/09/2024.  I have also reviewed past psychiatric history from my progress note on 04/27/2024.  Past Medical History:  Past Medical History:  Diagnosis Date   Alcohol abuse    Anemia    Anxiety    a.) on BZO (lorazepam ) PRN   Crack cocaine use    Demand ischemia (HCC)    a. 08/2023 HsTrop 107 in setting of crack cocaine; b. 08/2023 Echo:  EF of 60-65%, without regional wall motion abnormalities, normal RV size and function, mild MR, and mild-moderate TR.   Depression    Family history of adverse reaction to anesthesia    a.) PONV in 1st degree relative (mother)   GERD (gastroesophageal reflux disease)    Gestational diabetes    H/O dizziness    H/O eating disorder    Herpes    History of chicken pox    History of colon polyps    History of kidney stones     Hyperlipidemia    Vitamin B12 deficiency     Past Surgical History:  Procedure Laterality Date   ABDOMINAL HYSTERECTOMY  11/18/1996   APPENDECTOMY  11/19/1995   COLONOSCOPY     DILATION AND CURETTAGE OF UTERUS     multiple   TONSILLECTOMY  11/19/1995   TOTAL HIP ARTHROPLASTY Left 03/18/2024   Procedure: ARTHROPLASTY, HIP, TOTAL, ANTERIOR APPROACH;  Surgeon: Lorelle Hussar, MD;  Location: [facility] ORS;  Service: Orthopedics;  Laterality: Left;   WRIST FRACTURE SURGERY Left     Family Psychiatric History: I reviewed family psychiatric history from progress note on 02/09/2024.  Family History:  Family History  Problem Relation Age of Onset   Depression Mother    Hyperlipidemia Mother    Hypertension Mother    Breast cancer Mother 31   Dementia Father    Arthritis Father    Hyperlipidemia Father    Hypertension Father    Diabetes Father    Depression Brother    Hypertension Brother    Depression Brother    Depression Maternal Aunt    Arthritis Maternal Aunt    Hyperlipidemia Maternal Aunt    Hypertension Maternal Aunt    Arthritis Paternal Aunt    Hyperlipidemia Paternal Aunt    Hypertension Paternal Aunt    Alcohol abuse Maternal Uncle    Arthritis Maternal Uncle    Hyperlipidemia Maternal Uncle    Hypertension Maternal Uncle    Arthritis Paternal Uncle    Hyperlipidemia Paternal Uncle    Hypertension Paternal Uncle    Arthritis Maternal Grandfather    Lung cancer Maternal Grandfather    Prostate cancer Maternal Grandfather    Hyperlipidemia Maternal Grandfather    Stroke Maternal Grandfather    Hypertension Maternal Grandfather    Arthritis Maternal Grandmother    Hyperlipidemia Maternal Grandmother    Hypertension Maternal Grandmother    Alcohol abuse Paternal Grandfather    Arthritis Paternal Grandfather    Hyperlipidemia Paternal Grandfather    Hypertension Paternal Grandfather    Sudden death Paternal Grandfather    Arthritis Paternal Grandmother     Hyperlipidemia Paternal Grandmother    Hypertension Paternal Grandmother     Social History: I have reviewed social history from progress note on 02/09/2024. Social History   Socioeconomic History   Marital status: Divorced    Spouse name: Not on file   Number of children: 3   Years of education: Not on file   Highest education level: Bachelor's degree (e.g., BA, AB, BS)  Occupational History   Not on file  Tobacco Use   Smoking status: Never   Smokeless tobacco: Never  Vaping Use   Vaping status: Never Used  Substance and Sexual Activity   Alcohol use: No   Drug use: Not Currently    Types: Crack cocaine   Sexual activity: Not Currently  Other Topics Concern   Not on file  Social History Narrative   Not on file   Social Drivers  of Health   Financial Resource Strain: High Risk (06/29/2024)   Overall Financial Resource Strain (CARDIA)    Difficulty of Paying Living Expenses: Very hard  Food Insecurity: Food Insecurity Present (06/29/2024)   Hunger Vital Sign    Worried About Running Out of Food in the Last Year: Sometimes true    Ran Out of Food in the Last Year: Sometimes true  Transportation Needs: No Transportation Needs (06/29/2024)   PRAPARE - Administrator, Civil Service (Medical): No    Lack of Transportation (Non-Medical): No  Physical Activity: Inactive (06/29/2024)   Exercise Vital Sign    Days of Exercise per Week: 0 days    Minutes of Exercise per Session: 0 min  Stress: Stress Concern Present (06/29/2024)   [employer] of Occupational Health - Occupational Stress Questionnaire    Feeling of Stress: Very much  Social Connections: Socially Isolated (06/29/2024)   Social Connection and Isolation Panel    Frequency of Communication with Friends and Family: Three times a week    Frequency of Social Gatherings with Friends and Family: Once a week    Attends Religious Services: Never    Database administrator or Organizations: No    Attends  Banker Meetings: Never    Marital Status: Divorced    Allergies:  Allergies  Allergen Reactions   Aleve [Naproxen Sodium] Hives    Metabolic Disorder Labs: Lab Results  Component Value Date   HGBA1C 5.9 02/24/2024   MPG 111.15 09/11/2023   MPG 116.89 04/08/2023   No results found for: PROLACTIN Lab Results  Component Value Date   CHOL 214 (H) 09/12/2023   TRIG 153 (H) 09/12/2023   HDL 55 09/12/2023   CHOLHDL 3.9 09/12/2023   VLDL 31 09/12/2023   LDLCALC 128 (H) 09/12/2023   LDLCALC 137 (H) 04/08/2023   Lab Results  Component Value Date   TSH 3.79 02/24/2024   TSH 2.288 04/08/2023    Therapeutic Level Labs: No results found for: LITHIUM No results found for: VALPROATE No results found for: CBMZ  Current Medications: Current Outpatient Medications  Medication Sig Dispense Refill   venlafaxine  XR (EFFEXOR  XR) 37.5 MG 24 hr capsule Take 1 capsule (37.5 mg total) by mouth daily with breakfast. 90 capsule 0   busPIRone  (BUSPAR ) 10 MG tablet Take 1 tablet (10 mg total) by mouth 2 (two) times daily. 60 tablet 1   celecoxib  (CELEBREX ) 200 MG capsule Take 200 mg by mouth.     Cholecalciferol (D3-1000) 25 MCG (1000 UT) tablet Take 1,000 Units by mouth daily.     cyanocobalamin  ([facility] VITAMIN B12) 1000 MCG tablet Take 1,000 mcg by mouth daily.     DULoxetine  (CYMBALTA ) 30 MG capsule Take 1 capsule (30 mg total) by mouth as directed for 3 days. Take every other day for 3 doses and stop     gabapentin  (NEURONTIN ) 300 MG capsule Take 1 capsule (300 mg total) by mouth at bedtime. 30 capsule 1   ondansetron  (ZOFRAN -ODT) 4 MG disintegrating tablet Allow 1-2 tablets to dissolve in your mouth every 8 hours as needed for nausea/vomiting 30 tablet 0   pantoprazole  (PROTONIX ) 40 MG tablet TAKE 1 TABLET BY MOUTH EVERY DAY 90 tablet 1   promethazine  (PHENERGAN ) 12.5 MG tablet Take 1 tablet (12.5 mg total) by mouth every 6 (six) hours as needed for nausea or vomiting. 30  tablet 0   propranolol  (INDERAL ) 10 MG tablet Take 1 tablet (10 mg total) by  mouth 2 (two) times daily as needed. For severe anxiety attacks only 180 tablet 0   No current facility-administered medications for this visit.     Musculoskeletal: Strength & Muscle Tone: within normal limits Gait & Station: normal Patient leans: N/A  Psychiatric Specialty Exam: Review of Systems  Psychiatric/Behavioral:  The patient is nervous/anxious.        Crying spells    Blood pressure 138/86, pulse 85, temperature (!) 96.8 F (36 C), temperature source Temporal, height 5' 3 (1.6 m), weight 180 lb 12.8 oz (82 kg).Body mass index is 32.03 kg/m.  General Appearance: Casual  Eye Contact:  Fair  Speech:  Clear and Coherent  Volume:  Normal  Mood:  Anxious and crying spells  Affect:  Appropriate  Thought Process:  Goal Directed and Descriptions of Associations: Intact  Orientation:  Full (Time, Place, and Person)  Thought Content: Logical   Suicidal Thoughts:  No  Homicidal Thoughts:  No  Memory:  Immediate;   Fair Recent;   Fair Remote;   Fair  Judgement:  Fair  Insight:  Fair  Psychomotor Activity:  Normal  Concentration:  Concentration: Fair and Attention Span: Fair  Recall:  [employer] of Knowledge: Fair  Language: Fair  Akathisia:  No  Handed:  Right  AIMS (if indicated): not done  Assets:  Communication Skills Desire for Improvement Housing Social Support Transportation  ADL's:  Intact  Cognition: WNL  Sleep:  Fair   Screenings: AIMS    Flowsheet Row Office Visit from 04/27/2024 in [facility]  AIMS Total Score 0   AUDIT    Flowsheet Row [facility]  Alcohol Use Disorder Identification Test Final Score (AUDIT) 30   GAD-7    Flowsheet Row Counselor from 06/29/2024 in [facility] Office Visit from 06/09/2024 in [facility] Office Visit from 04/27/2024 in [facility] Office Visit from 02/09/2024 in [facility] Office Visit from 12/11/2023 in [facility]  Total GAD-7 Score 16 18 19 16 15    PHQ2-9    Flowsheet Row Office Visit from 07/08/2024 in [facility] Counselor from 06/29/2024 in [facility] Office Visit from 06/09/2024 in [facility] Office Visit from 04/27/2024 in [facility] Office Visit from 02/09/2024 in [facility]  PHQ-2 Total Score 4 6 4 2 6   PHQ-9 Total Score 13 20 15 12 24    Flowsheet Row Office Visit from 07/08/2024 in [facility] Counselor from 06/29/2024 in [facility] Office Visit from 06/09/2024 in [facility]  C-SSRS RISK CATEGORY Moderate Risk Error: Q7 should not be populated when Q6 is No Moderate Risk     Assessment and Plan: MERI PELOT is a 58 year old Caucasian female who has a history of depression, anxiety, history of alcoholism, was evaluated in office today for a follow-up appointment.  Discussed assessment and plan as noted below.  Generalized anxiety disorder-unstable Currently struggling with anxiety symptoms, recently Cymbalta  dosage was reduced with plan to taper it off due to lack of benefit.  Multiple situational changes likely contributing to anxiety as well.  Motivated to stay in therapy and interested in trial of Effexor .  Start Effexor  37.5 mg daily in the morning Reduce Cymbalta  to 30 mg every other day for 3 doses and stop taking it Continue BuSpar  10 mg twice a day Continue Gabapentin  300  mg at bedtime Continue Propranolol  10 mg twice a day as needed for severe anxiety attacks only  MDD-unstable Recent worsening of crying spells since Cymbalta  dosage tapered down. Start Effexor  37.5 mg daily in the morning Discontinue Seroquel , was tapered off. Patient encouraged to continue psychotherapy sessions with Ms. Veva  Alcohol use disorder in early remission Currently trying to stay away from alcohol, last use was in February 2025.  Sober previously for 13 years. Encouraged abstinence   Follow-up Follow-up in clinic in 4 weeks or sooner if needed.   Collaboration of Care: Collaboration of Care: Referral or follow-up with counselor/therapist AEB encouraged to continue psychotherapy sessions.  Patient/Guardian was advised Release of Information must be obtained prior to any record release in order to collaborate their care with an outside provider. Patient/Guardian was advised if they have not already done so to contact the registration department to sign all necessary forms in order for us  to release information regarding their care.   Consent: Patient/Guardian gives verbal consent for treatment and assignment of benefits for services provided during this visit. Patient/Guardian expressed understanding and agreed to proceed.   This note was generated in part or whole with voice recognition software. Voice recognition is usually quite accurate but there are transcription errors that can and very often do occur. I apologize for any typographical errors that were not detected and corrected.    Jayro Mcmath, MD 07/09/2024, 8:02 AM

## 2024-07-09 ENCOUNTER — Encounter

## 2024-07-09 ENCOUNTER — Other Ambulatory Visit (INDEPENDENT_AMBULATORY_CARE_PROVIDER_SITE_OTHER)

## 2024-07-09 DIAGNOSIS — Z111 Encounter for screening for respiratory tuberculosis: Secondary | ICD-10-CM | POA: Diagnosis not present

## 2024-07-09 NOTE — Progress Notes (Signed)
 Patient came in for a TB Skin Read test 07/09/24, but patient was a day over from the 72 hour window. Per Leron Glance, NP OK for patient to have the TB blood test for today's visit.

## 2024-07-09 NOTE — Progress Notes (Deleted)
 PPD Reading Note PPD read and results entered in Epic[employer]. Result: 00 mm induration. Interpretation: Negative If test not read within 48-72 hours of initial placement, patient advised to repeat in other arm 1-3 weeks after this test. Allergic reaction: no

## 2024-07-13 NOTE — Progress Notes (Unsigned)
 A user error has taken place: encounter opened in error, closed for administrative reasons.

## 2024-07-14 ENCOUNTER — Ambulatory Visit: Payer: Self-pay | Admitting: Nurse Practitioner

## 2024-07-14 LAB — QUANTIFERON-TB GOLD PLUS
Mitogen-NIL: 5.99 [IU]/mL
NIL: 0.16 [IU]/mL
QuantiFERON-TB Gold Plus: NEGATIVE
TB1-NIL: 0.01 [IU]/mL
TB2-NIL: 0.05 [IU]/mL

## 2024-07-14 NOTE — Progress Notes (Signed)
 Entered in error

## 2024-07-16 ENCOUNTER — Telehealth: Admitting: Psychiatry

## 2024-07-20 ENCOUNTER — Telehealth: Payer: Self-pay

## 2024-07-20 DIAGNOSIS — F411 Generalized anxiety disorder: Secondary | ICD-10-CM

## 2024-07-20 DIAGNOSIS — F331 Major depressive disorder, recurrent, moderate: Secondary | ICD-10-CM

## 2024-07-20 NOTE — Telephone Encounter (Signed)
 pt left message asking if she if she can get rx for the increase of the gabapentin , she also wanted to find out if she needed to stay on same dosage of the venlafaxine  or increase. pt was last seen on 8-21 next appt 10-15

## 2024-07-21 MED ORDER — GABAPENTIN 600 MG PO TABS: 600.0000 mg | ORAL_TABLET | Freq: Every day | ORAL | 0 refills | Status: DC

## 2024-07-21 NOTE — Telephone Encounter (Signed)
 I have increased gabapentin  to 600 mg at bedtime.  I have sent the medication to [facility].  Will recommend keeping the venlafaxine  at the current dosage for now.

## 2024-07-21 NOTE — Telephone Encounter (Signed)
 left message with information. asked for pt to call office back to confirm that they understand info. (I will call again in the A.M. if i don't hear anything back)

## 2024-07-22 NOTE — Telephone Encounter (Signed)
 pt confirmed that she received message.

## 2024-07-27 ENCOUNTER — Ambulatory Visit (INDEPENDENT_AMBULATORY_CARE_PROVIDER_SITE_OTHER): Admitting: Internal Medicine

## 2024-07-27 ENCOUNTER — Ambulatory Visit (INDEPENDENT_AMBULATORY_CARE_PROVIDER_SITE_OTHER)

## 2024-07-27 VITALS — BP 128/74 | HR 85 | Resp 16 | Ht 63.0 in | Wt 181.2 lb

## 2024-07-27 DIAGNOSIS — S4991XA Unspecified injury of right shoulder and upper arm, initial encounter: Secondary | ICD-10-CM | POA: Diagnosis not present

## 2024-07-27 DIAGNOSIS — F419 Anxiety disorder, unspecified: Secondary | ICD-10-CM

## 2024-07-27 DIAGNOSIS — F1021 Alcohol dependence, in remission: Secondary | ICD-10-CM | POA: Diagnosis not present

## 2024-07-27 DIAGNOSIS — R739 Hyperglycemia, unspecified: Secondary | ICD-10-CM | POA: Diagnosis not present

## 2024-07-27 DIAGNOSIS — E7800 Pure hypercholesterolemia, unspecified: Secondary | ICD-10-CM | POA: Diagnosis not present

## 2024-07-27 DIAGNOSIS — M25571 Pain in right ankle and joints of right foot: Secondary | ICD-10-CM | POA: Diagnosis not present

## 2024-07-27 DIAGNOSIS — Z0289 Encounter for other administrative examinations: Secondary | ICD-10-CM

## 2024-07-27 DIAGNOSIS — F332 Major depressive disorder, recurrent severe without psychotic features: Secondary | ICD-10-CM

## 2024-07-27 DIAGNOSIS — F1491 Cocaine use, unspecified, in remission: Secondary | ICD-10-CM

## 2024-07-27 LAB — LIPID PANEL
Cholesterol: 251 mg/dL — ABNORMAL HIGH (ref 0–200)
HDL: 61 mg/dL (ref >39.00)
LDL Cholesterol: 155 mg/dL — ABNORMAL HIGH (ref 0–99)
NonHDL: 189.92
Total CHOL/HDL Ratio: 4
Triglycerides: 174 mg/dL — ABNORMAL HIGH (ref 0.0–149.0)
VLDL: 34.8 mg/dL (ref 0.0–40.0)

## 2024-07-27 LAB — HEPATIC FUNCTION PANEL
ALT: 16 U/L (ref 0–35)
AST: 17 U/L (ref 0–37)
Albumin: 4.4 g/dL (ref 3.5–5.2)
Alkaline Phosphatase: 102 U/L (ref 39–117)
Bilirubin, Direct: 0.1 mg/dL (ref 0.0–0.3)
Total Bilirubin: 0.4 mg/dL (ref 0.2–1.2)
Total Protein: 7.2 g/dL (ref 6.0–8.3)

## 2024-07-27 LAB — BASIC METABOLIC PANEL WITH GFR
BUN: 17 mg/dL (ref 6–23)
CO2: 27 meq/L (ref 19–32)
Calcium: 9.4 mg/dL (ref 8.4–10.5)
Chloride: 105 meq/L (ref 96–112)
Creatinine, Ser: 0.77 mg/dL (ref 0.40–1.20)
GFR: 86.58 mL/min (ref >60.00)
Glucose, Bld: 85 mg/dL (ref 70–99)
Potassium: 4.9 meq/L (ref 3.5–5.1)
Sodium: 141 meq/L (ref 135–145)

## 2024-07-27 LAB — HEMOGLOBIN A1C: Hgb A1c MFr Bld: 7 % — ABNORMAL HIGH (ref 4.6–6.5)

## 2024-07-27 NOTE — Progress Notes (Unsigned)
 Subjective:    Patient ID: Tracy Phillips, female    DOB: 1966-08-04, 58 y.o.   MRN: [id number]  Patient here for  Chief Complaint  Patient presents with   Form Completion    HPI Here for work in appt - form completion. She is doing well. 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 Enjoying her work. Doing much better. Feeling better. No cocaine since 12/2023. Doing well with this. Breathing stable. No chest pain. No abdominal pain or bowel change. Persistent pain - right ankle. Some swelling. Also, some low back pain - intermittent. Discussed avoiding strained positions.    Past Medical History:  Diagnosis Date   Alcohol abuse    Anemia    Anxiety    a.) on BZO (lorazepam ) PRN   Crack cocaine use    Demand ischemia (HCC)    a. 08/2023 HsTrop 107 in setting of crack cocaine; b. 08/2023 Echo: EF of 60-65%, without regional wall motion abnormalities, normal RV size and function, mild MR, and mild-moderate TR.   Depression    Family history of adverse reaction to anesthesia    a.) PONV in 1st degree relative (mother)   GERD (gastroesophageal reflux disease)    Gestational diabetes    H/O dizziness    H/O eating disorder    Herpes    History of chicken pox    History of colon polyps    History of kidney stones    Hyperlipidemia    Vitamin B12 deficiency    Past Surgical History:  Procedure Laterality Date   ABDOMINAL HYSTERECTOMY  11/18/1996   APPENDECTOMY  11/19/1995   COLONOSCOPY     DILATION AND CURETTAGE OF UTERUS     multiple   TONSILLECTOMY  11/19/1995   TOTAL HIP ARTHROPLASTY Left 03/18/2024   Procedure: ARTHROPLASTY, HIP, TOTAL, ANTERIOR APPROACH;  Surgeon: Lorelle Hussar, MD;  Location: [facility] ORS;  Service: Orthopedics;  Laterality: Left;   WRIST FRACTURE SURGERY Left    Family History  Problem Relation Age of Onset   Depression Mother    Hyperlipidemia Mother    Hypertension Mother    Breast cancer Mother 67   Dementia Father    Arthritis Father     Hyperlipidemia Father    Hypertension Father    Diabetes Father    Depression Brother    Hypertension Brother    Depression Brother    Depression Maternal Aunt    Arthritis Maternal Aunt    Hyperlipidemia Maternal Aunt    Hypertension Maternal Aunt    Arthritis Paternal Aunt    Hyperlipidemia Paternal Aunt    Hypertension Paternal Aunt    Alcohol abuse Maternal Uncle    Arthritis Maternal Uncle    Hyperlipidemia Maternal Uncle    Hypertension Maternal Uncle    Arthritis Paternal Uncle    Hyperlipidemia Paternal Uncle    Hypertension Paternal Uncle    Arthritis Maternal Grandfather    Lung cancer Maternal Grandfather    Prostate cancer Maternal Grandfather    Hyperlipidemia Maternal Grandfather    Stroke Maternal Grandfather    Hypertension Maternal Grandfather    Arthritis Maternal Grandmother    Hyperlipidemia Maternal Grandmother    Hypertension Maternal Grandmother    Alcohol abuse Paternal Grandfather    Arthritis Paternal Grandfather    Hyperlipidemia Paternal Grandfather    Hypertension Paternal Grandfather    Sudden death Paternal Grandfather    Arthritis Paternal Grandmother    Hyperlipidemia Paternal Grandmother    Hypertension Paternal Grandmother  Social History   Socioeconomic History   Marital status: Divorced    Spouse name: Not on file   Number of children: 3   Years of education: Not on file   Highest education level: Bachelor's degree (e.g., BA, AB, BS)  Occupational History   Not on file  Tobacco Use   Smoking status: Never   Smokeless tobacco: Never  Vaping Use   Vaping status: Never Used  Substance and Sexual Activity   Alcohol use: No   Drug use: Not Currently    Types: Crack cocaine   Sexual activity: Not Currently  Other Topics Concern   Not on file  Social History Narrative   Not on file   Social Drivers of Health   Financial Resource Strain: High Risk (07/27/2024)   Overall Financial Resource Strain (CARDIA)    Difficulty  of Paying Living Expenses: Hard  Food Insecurity: Food Insecurity Present (07/27/2024)   Hunger Vital Sign    Worried About Running Out of Food in the Last Year: Often true    Ran Out of Food in the Last Year: Sometimes true  Transportation Needs: No Transportation Needs (07/27/2024)   PRAPARE - Administrator, Civil Service (Medical): No    Lack of Transportation (Non-Medical): No  Physical Activity: Sufficiently Active (07/27/2024)   Exercise Vital Sign    Days of Exercise per Week: 5 days    Minutes of Exercise per Session: 90 min  Recent Concern: Physical Activity - Inactive (06/29/2024)   Exercise Vital Sign    Days of Exercise per Week: 0 days    Minutes of Exercise per Session: 0 min  Stress: Stress Concern Present (07/27/2024)   [employer] of Occupational Health - Occupational Stress Questionnaire    Feeling of Stress: To some extent  Social Connections: Moderately Integrated (07/27/2024)   Social Connection and Isolation Panel    Frequency of Communication with Friends and Family: More than three times a week    Frequency of Social Gatherings with Friends and Family: Once a week    Attends Religious Services: More than 4 times per year    Active Member of [employer] or Organizations: Yes    Attends Banker Meetings: More than 4 times per year    Marital Status: Divorced  Recent Concern: Social Connections - Socially Isolated (06/29/2024)   Social Connection and Isolation Panel    Frequency of Communication with Friends and Family: Three times a week    Frequency of Social Gatherings with Friends and Family: Once a week    Attends Religious Services: Never    Database administrator or Organizations: No    Attends Banker Meetings: Never    Marital Status: Divorced     Review of Systems  Constitutional:  Negative for appetite change and unexpected weight change.  HENT:  Negative for congestion and sinus pressure.   Respiratory:  Negative for  cough, chest tightness and shortness of breath.   Cardiovascular:  Negative for chest pain, palpitations and leg swelling.  Gastrointestinal:  Negative for abdominal pain, diarrhea, nausea and vomiting.  Genitourinary:  Negative for difficulty urinating and dysuria.  Musculoskeletal:  Positive for back pain. Negative for joint swelling and myalgias.  Skin:  Negative for color change and rash.  Neurological:  Negative for dizziness and headaches.  Psychiatric/Behavioral:  Negative for agitation and dysphoric mood.        Objective:     BP 128/74   Pulse 85  Resp 16   Ht 5' 3 (1.6 m)   Wt 181 lb 3.2 oz (82.2 kg)   SpO2 98%   BMI 32.10 kg/m  Wt Readings from Last 3 Encounters:  07/30/24 186 lb (84.4 kg)  07/27/24 181 lb 3.2 oz (82.2 kg)  06/06/24 179 lb (81.2 kg)    Physical Exam Vitals reviewed.  Constitutional:      General: She is not in acute distress.    Appearance: Normal appearance.  HENT:     Head: Normocephalic and atraumatic.     Right Ear: External ear normal.     Left Ear: External ear normal.     Mouth/Throat:     Pharynx: No oropharyngeal exudate or posterior oropharyngeal erythema.  Eyes:     General: No scleral icterus.       Right eye: No discharge.        Left eye: No discharge.     Conjunctiva/sclera: Conjunctivae normal.  Neck:     Thyroid : No thyromegaly.  Cardiovascular:     Rate and Rhythm: Normal rate and regular rhythm.  Pulmonary:     Effort: No respiratory distress.     Breath sounds: Normal breath sounds. No wheezing.  Abdominal:     General: Bowel sounds are normal.     Palpations: Abdomen is soft.     Tenderness: There is no abdominal tenderness.  Musculoskeletal:        General: No swelling or tenderness.     Cervical back: Neck supple. No tenderness.  Lymphadenopathy:     Cervical: No cervical adenopathy.  Skin:    Findings: No erythema or rash.  Neurological:     Mental Status: She is alert.  Psychiatric:        Mood and  Affect: Mood normal.        Behavior: Behavior normal.         Outpatient Encounter Medications as of 07/27/2024  Medication Sig   methocarbamol  (ROBAXIN ) 500 MG tablet Take 500 mg by mouth 4 (four) times daily.   busPIRone  (BUSPAR ) 10 MG tablet Take 1 tablet (10 mg total) by mouth 2 (two) times daily.   gabapentin  (NEURONTIN ) 600 MG tablet Take 1 tablet (600 mg total) by mouth at bedtime. Dose change   ondansetron  (ZOFRAN -ODT) 4 MG disintegrating tablet Allow 1-2 tablets to dissolve in your mouth every 8 hours as needed for nausea/vomiting   promethazine  (PHENERGAN ) 12.5 MG tablet Take 1 tablet (12.5 mg total) by mouth every 6 (six) hours as needed for nausea or vomiting.   propranolol  (INDERAL ) 10 MG tablet Take 1 tablet (10 mg total) by mouth 2 (two) times daily as needed. For severe anxiety attacks only   venlafaxine  XR (EFFEXOR  XR) 37.5 MG 24 hr capsule Take 1 capsule (37.5 mg total) by mouth daily with breakfast.   [DISCONTINUED] celecoxib  (CELEBREX ) 200 MG capsule Take 200 mg by mouth.   [DISCONTINUED] Cholecalciferol (D3-1000) 25 MCG (1000 UT) tablet Take 1,000 Units by mouth daily.   [DISCONTINUED] cyanocobalamin  ([employer] VITAMIN B12) 1000 MCG tablet Take 1,000 mcg by mouth daily.   [DISCONTINUED] DULoxetine  (CYMBALTA ) 30 MG capsule Take 1 capsule (30 mg total) by mouth as directed for 3 days. Take every other day for 3 doses and stop   [DISCONTINUED] pantoprazole  (PROTONIX ) 40 MG tablet TAKE 1 TABLET BY MOUTH EVERY DAY   No facility-administered encounter medications on file as of 07/27/2024.     Lab Results  Component Value Date   WBC 5.8 06/06/2024  HGB 14.7 06/06/2024   HCT 44.3 06/06/2024   PLT 236 06/06/2024   GLUCOSE 85 07/27/2024   CHOL 251 (H) 07/27/2024   TRIG 174.0 (H) 07/27/2024   HDL 61.00 07/27/2024   LDLDIRECT 132.0 05/13/2019   LDLCALC 155 (H) 07/27/2024   ALT 16 07/27/2024   AST 17 07/27/2024   NA 141 07/27/2024   K 4.9 07/27/2024   CL 105 07/27/2024    CREATININE 0.77 07/27/2024   BUN 17 07/27/2024   CO2 27 07/27/2024   TSH 3.79 02/24/2024   HGBA1C 7.0 (H) 07/27/2024   MICROALBUR 0.8 07/30/2024    No results found.     Assessment & Plan:  Hypercholesterolemia Assessment & Plan: Low cholesterol diet and exercise. Have discussed statin medication previously. Follow lipid panel.   Orders: -     Lipid panel -     Hepatic function panel -     Basic metabolic panel with GFR  Hyperglycemia Assessment & Plan: Low carb diet and exercise. Follow met b and A1c.   Orders: -     Hemoglobin A1c  Right ankle pain, unspecified chronicity Assessment & Plan: Persistent pain. Check xray.   Orders: -     DG Ankle 2 Views Right; Future  Alcohol use disorder, moderate, in early remission (HCC) Assessment & Plan: No alcohol. Denies - none since 12/2023. Doing well. Follow.    Anxiety and depression Assessment & Plan: Seeing psychiatry. Doing much better. Feeling better. Working. Continues on cymbalta  and buspar . Follow.    History of cocaine use Assessment & Plan: No cocaine since 12/2023. Doing well. Continue f/u with psychiatry.    Severe episode of recurrent major depressive disorder, without psychotic features (HCC) Assessment & Plan: Currently on buspar  and effexor . Overall feels better. Doing better. New job is going well. Follow.    Encounter for completion of form with patient Assessment & Plan: Form for work completed.       Allena Hamilton, MD

## 2024-07-28 ENCOUNTER — Telehealth: Payer: Self-pay

## 2024-07-28 ENCOUNTER — Ambulatory Visit: Admitting: Professional Counselor

## 2024-07-28 ENCOUNTER — Ambulatory Visit: Payer: Self-pay | Admitting: Internal Medicine

## 2024-07-28 DIAGNOSIS — M25571 Pain in right ankle and joints of right foot: Secondary | ICD-10-CM

## 2024-07-28 NOTE — Telephone Encounter (Signed)
 Pt has appt for 9/12 per chart review

## 2024-07-28 NOTE — Telephone Encounter (Signed)
 Copied from CRM [id number]. Topic: Appointments - Appointment Scheduling >> Jul 28, 2024  9:34 AM Rea ORN wrote: Patient/patient representative is calling to schedule an appointment. Refer to attachments for appointment information.

## 2024-07-28 NOTE — Telephone Encounter (Signed)
 Copied from CRM #[id number]. Topic: General - Call Back - No Documentation >> Jul 28, 2024  9:28 AM Rea ORN wrote: Reason for CRM: Pt called nurse back. I gave her results for labs and xray. Pt is agreeable to see Podiatry.  Appt made for 10/27 but pt would like to see PCP sooner than that to discuss treatment plan.

## 2024-07-28 NOTE — Telephone Encounter (Signed)
Order placed for podiatry referral.   

## 2024-07-30 ENCOUNTER — Ambulatory Visit: Admitting: Internal Medicine

## 2024-07-30 VITALS — BP 120/70 | HR 84 | Resp 16 | Ht 63.0 in | Wt 186.0 lb

## 2024-07-30 DIAGNOSIS — Z7985 Long-term (current) use of injectable non-insulin antidiabetic drugs: Secondary | ICD-10-CM

## 2024-07-30 DIAGNOSIS — F332 Major depressive disorder, recurrent severe without psychotic features: Secondary | ICD-10-CM | POA: Diagnosis not present

## 2024-07-30 DIAGNOSIS — E1165 Type 2 diabetes mellitus with hyperglycemia: Secondary | ICD-10-CM

## 2024-07-30 DIAGNOSIS — E7800 Pure hypercholesterolemia, unspecified: Secondary | ICD-10-CM | POA: Diagnosis not present

## 2024-07-30 DIAGNOSIS — F1021 Alcohol dependence, in remission: Secondary | ICD-10-CM

## 2024-07-30 DIAGNOSIS — F1491 Cocaine use, unspecified, in remission: Secondary | ICD-10-CM | POA: Diagnosis not present

## 2024-07-30 LAB — MICROALBUMIN / CREATININE URINE RATIO
Creatinine,U: 139.4 mg/dL
Microalb Creat Ratio: 5.5 mg/g (ref 0.0–30.0)
Microalb, Ur: 0.8 mg/dL (ref 0.0–1.9)

## 2024-07-30 LAB — HM DIABETES FOOT EXAM

## 2024-07-30 MED ORDER — SEMAGLUTIDE(0.25 OR 0.5MG/DOS) 2 MG/3ML ~~LOC~~ SOPN: 0.2500 mg | PEN_INJECTOR | SUBCUTANEOUS | 2 refills | Status: DC

## 2024-07-30 NOTE — Progress Notes (Unsigned)
 Subjective:    Patient ID: Tracy Phillips, female    DOB: 07/10/1966, 58 y.o.   MRN: [id number]  Patient here for  Chief Complaint  Patient presents with   Discuss lab results    HPI Here for work in appt - work in to discuss recent lab results. Recent A1c 7.0. discussed diabetes. Discussed low carb diet and exercise. Discussed treatment options. Cholesterol also increased. Doing well - new job. No drugs - since 12/2023. Feels better. Denies problems with acid reflux. Discussed the need to keep bowels moving.    Past Medical History:  Diagnosis Date   Alcohol abuse    Anemia    Anxiety    a.) on BZO (lorazepam ) PRN   Crack cocaine use    Demand ischemia (HCC)    a. 08/2023 HsTrop 107 in setting of crack cocaine; b. 08/2023 Echo: EF of 60-65%, without regional wall motion abnormalities, normal RV size and function, mild MR, and mild-moderate TR.   Depression    Family history of adverse reaction to anesthesia    a.) PONV in 1st degree relative (mother)   GERD (gastroesophageal reflux disease)    Gestational diabetes    H/O dizziness    H/O eating disorder    Herpes    History of chicken pox    History of colon polyps    History of kidney stones    Hyperlipidemia    Vitamin B12 deficiency    Past Surgical History:  Procedure Laterality Date   ABDOMINAL HYSTERECTOMY  11/18/1996   APPENDECTOMY  11/19/1995   COLONOSCOPY     DILATION AND CURETTAGE OF UTERUS     multiple   TONSILLECTOMY  11/19/1995   TOTAL HIP ARTHROPLASTY Left 03/18/2024   Procedure: ARTHROPLASTY, HIP, TOTAL, ANTERIOR APPROACH;  Surgeon: Lorelle Hussar, MD;  Location: [facility] ORS;  Service: Orthopedics;  Laterality: Left;   WRIST FRACTURE SURGERY Left    Family History  Problem Relation Age of Onset   Depression Mother    Hyperlipidemia Mother    Hypertension Mother    Breast cancer Mother 39   Dementia Father    Arthritis Father    Hyperlipidemia Father    Hypertension Father    Diabetes Father     Depression Brother    Hypertension Brother    Depression Brother    Depression Maternal Aunt    Arthritis Maternal Aunt    Hyperlipidemia Maternal Aunt    Hypertension Maternal Aunt    Arthritis Paternal Aunt    Hyperlipidemia Paternal Aunt    Hypertension Paternal Aunt    Alcohol abuse Maternal Uncle    Arthritis Maternal Uncle    Hyperlipidemia Maternal Uncle    Hypertension Maternal Uncle    Arthritis Paternal Uncle    Hyperlipidemia Paternal Uncle    Hypertension Paternal Uncle    Arthritis Maternal Grandfather    Lung cancer Maternal Grandfather    Prostate cancer Maternal Grandfather    Hyperlipidemia Maternal Grandfather    Stroke Maternal Grandfather    Hypertension Maternal Grandfather    Arthritis Maternal Grandmother    Hyperlipidemia Maternal Grandmother    Hypertension Maternal Grandmother    Alcohol abuse Paternal Grandfather    Arthritis Paternal Grandfather    Hyperlipidemia Paternal Grandfather    Hypertension Paternal Grandfather    Sudden death Paternal Grandfather    Arthritis Paternal Grandmother    Hyperlipidemia Paternal Grandmother    Hypertension Paternal Grandmother    Social History   Socioeconomic History  Marital status: Divorced    Spouse name: Not on file   Number of children: 3   Years of education: Not on file   Highest education level: Bachelor's degree (e.g., BA, AB, BS)  Occupational History   Not on file  Tobacco Use   Smoking status: Never   Smokeless tobacco: Never  Vaping Use   Vaping status: Never Used  Substance and Sexual Activity   Alcohol use: No   Drug use: Not Currently    Types: Crack cocaine   Sexual activity: Not Currently  Other Topics Concern   Not on file  Social History Narrative   Not on file   Social Drivers of Health   Financial Resource Strain: High Risk (07/27/2024)   Overall Financial Resource Strain (CARDIA)    Difficulty of Paying Living Expenses: Hard  Food Insecurity: Food Insecurity  Present (07/27/2024)   Hunger Vital Sign    Worried About Running Out of Food in the Last Year: Often true    Ran Out of Food in the Last Year: Sometimes true  Transportation Needs: No Transportation Needs (07/27/2024)   PRAPARE - Administrator, Civil Service (Medical): No    Lack of Transportation (Non-Medical): No  Physical Activity: Sufficiently Active (07/27/2024)   Exercise Vital Sign    Days of Exercise per Week: 5 days    Minutes of Exercise per Session: 90 min  Recent Concern: Physical Activity - Inactive (06/29/2024)   Exercise Vital Sign    Days of Exercise per Week: 0 days    Minutes of Exercise per Session: 0 min  Stress: Stress Concern Present (07/27/2024)   [employer] of Occupational Health - Occupational Stress Questionnaire    Feeling of Stress: To some extent  Social Connections: Moderately Integrated (07/27/2024)   Social Connection and Isolation Panel    Frequency of Communication with Friends and Family: More than three times a week    Frequency of Social Gatherings with Friends and Family: Once a week    Attends Religious Services: More than 4 times per year    Active Member of [employer] or Organizations: Yes    Attends Banker Meetings: More than 4 times per year    Marital Status: Divorced  Recent Concern: Social Connections - Socially Isolated (06/29/2024)   Social Connection and Isolation Panel    Frequency of Communication with Friends and Family: Three times a week    Frequency of Social Gatherings with Friends and Family: Once a week    Attends Religious Services: Never    Database administrator or Organizations: No    Attends Banker Meetings: Never    Marital Status: Divorced     Review of Systems  Constitutional:  Negative for appetite change and unexpected weight change.  HENT:  Negative for congestion and sinus pressure.   Respiratory:  Negative for cough, chest tightness and shortness of breath.   Cardiovascular:   Negative for chest pain, palpitations and leg swelling.  Gastrointestinal:  Negative for abdominal pain, diarrhea, nausea and vomiting.  Genitourinary:  Negative for difficulty urinating and dysuria.  Musculoskeletal:  Negative for myalgias.  Skin:  Negative for color change and rash.  Neurological:  Negative for dizziness and headaches.  Psychiatric/Behavioral:  Negative for agitation and dysphoric mood.        Objective:     BP 120/70   Pulse 84   Resp 16   Ht 5' 3 (1.6 m)   Wt 186  lb (84.4 kg)   SpO2 98%   BMI 32.95 kg/m  Wt Readings from Last 3 Encounters:  07/30/24 186 lb (84.4 kg)  07/27/24 181 lb 3.2 oz (82.2 kg)  06/06/24 179 lb (81.2 kg)    Physical Exam Vitals reviewed.  Constitutional:      General: She is not in acute distress.    Appearance: Normal appearance.  HENT:     Head: Normocephalic and atraumatic.     Right Ear: External ear normal.     Left Ear: External ear normal.     Mouth/Throat:     Pharynx: No oropharyngeal exudate or posterior oropharyngeal erythema.  Eyes:     General: No scleral icterus.       Right eye: No discharge.        Left eye: No discharge.     Conjunctiva/sclera: Conjunctivae normal.  Neck:     Thyroid : No thyromegaly.  Cardiovascular:     Rate and Rhythm: Normal rate and regular rhythm.  Pulmonary:     Effort: No respiratory distress.     Breath sounds: Normal breath sounds. No wheezing.  Abdominal:     General: Bowel sounds are normal.     Palpations: Abdomen is soft.     Tenderness: There is no abdominal tenderness.  Musculoskeletal:        General: No swelling or tenderness.     Cervical back: Neck supple. No tenderness.  Lymphadenopathy:     Cervical: No cervical adenopathy.  Skin:    Findings: No erythema or rash.  Neurological:     Mental Status: She is alert.  Psychiatric:        Mood and Affect: Mood normal.        Behavior: Behavior normal.         Outpatient Encounter Medications as of  07/30/2024  Medication Sig   Semaglutide ,0.25 or 0.5MG /DOS, 2 MG/3ML SOPN Inject 0.25 mg into the skin once a week.   busPIRone  (BUSPAR ) 10 MG tablet Take 1 tablet (10 mg total) by mouth 2 (two) times daily.   gabapentin  (NEURONTIN ) 600 MG tablet Take 1 tablet (600 mg total) by mouth at bedtime. Dose change   methocarbamol  (ROBAXIN ) 500 MG tablet Take 500 mg by mouth 4 (four) times daily.   ondansetron  (ZOFRAN -ODT) 4 MG disintegrating tablet Allow 1-2 tablets to dissolve in your mouth every 8 hours as needed for nausea/vomiting   promethazine  (PHENERGAN ) 12.5 MG tablet Take 1 tablet (12.5 mg total) by mouth every 6 (six) hours as needed for nausea or vomiting.   propranolol  (INDERAL ) 10 MG tablet Take 1 tablet (10 mg total) by mouth 2 (two) times daily as needed. For severe anxiety attacks only   venlafaxine  XR (EFFEXOR  XR) 37.5 MG 24 hr capsule Take 1 capsule (37.5 mg total) by mouth daily with breakfast.   No facility-administered encounter medications on file as of 07/30/2024.     Lab Results  Component Value Date   WBC 5.8 06/06/2024   HGB 14.7 06/06/2024   HCT 44.3 06/06/2024   PLT 236 06/06/2024   GLUCOSE 85 07/27/2024   CHOL 251 (H) 07/27/2024   TRIG 174.0 (H) 07/27/2024   HDL 61.00 07/27/2024   LDLDIRECT 132.0 05/13/2019   LDLCALC 155 (H) 07/27/2024   ALT 16 07/27/2024   AST 17 07/27/2024   NA 141 07/27/2024   K 4.9 07/27/2024   CL 105 07/27/2024   CREATININE 0.77 07/27/2024   BUN 17 07/27/2024   CO2 27 07/27/2024  TSH 3.79 02/24/2024   HGBA1C 7.0 (H) 07/27/2024   MICROALBUR 0.8 07/30/2024       Assessment & Plan:  Type 2 diabetes mellitus with hyperglycemia, without long-term current use of insulin (HCC) Assessment & Plan: Discussed recent A1c 7.0. discussed diagnosis of diabetes. Discussed low carb diet and exercise. Discussed medication options. She is interested in GLP 1 agonist. Discussed possible side effects and contraindications. Agreeable to start ozempic   .25mg  weekly. Call with update.   Orders: -     Microalbumin / creatinine urine ratio  Severe episode of recurrent major depressive disorder, without psychotic features (HCC) Assessment & Plan: Currently on buspar  and effexor . Overall feels better. Doing better. New job is going well. Follow.    Hypercholesterolemia Assessment & Plan: Low cholesterol diet and exercise. Discussed statin medication. Will start GLP 1 agonist today. Hold on starting another new medication. Follow. Will plan to add statin.    History of cocaine use Assessment & Plan: Doing well. Feels good. Denies cocaine use  - none since 12/2023. Follow.    Alcohol use disorder, moderate, in early remission [facility]) Assessment & Plan: No alcohol. Denies - none since 12/2023. Doing well. Follow.    Other orders -     Semaglutide (0.25 or 0.5MG /DOS); Inject 0.25 mg into the skin once a week.  Dispense: 3 mL; Refill: 2     Allena Hamilton, MD

## 2024-07-31 ENCOUNTER — Ambulatory Visit: Payer: Self-pay | Admitting: Internal Medicine

## 2024-07-31 ENCOUNTER — Encounter: Payer: Self-pay | Admitting: Internal Medicine

## 2024-07-31 NOTE — Assessment & Plan Note (Signed)
 Discussed recent A1c 7.0. discussed diagnosis of diabetes. Discussed low carb diet and exercise. Discussed medication options. She is interested in GLP 1 agonist. Discussed possible side effects and contraindications. Agreeable to start ozempic  .25mg  weekly. Call with update.

## 2024-07-31 NOTE — Assessment & Plan Note (Signed)
 Low cholesterol diet and exercise. Discussed statin medication. Will start GLP 1 agonist today. Hold on starting another new medication. Follow. Will plan to add statin.

## 2024-07-31 NOTE — Assessment & Plan Note (Signed)
 No alcohol. Denies - none since 12/2023. Doing well. Follow.

## 2024-07-31 NOTE — Assessment & Plan Note (Signed)
 Currently on buspar  and effexor . Overall feels better. Doing better. New job is going well. Follow.

## 2024-07-31 NOTE — Assessment & Plan Note (Signed)
 Doing well. Feels good. Denies cocaine use  - none since 12/2023. Follow.

## 2024-08-01 DIAGNOSIS — Z0289 Encounter for other administrative examinations: Secondary | ICD-10-CM | POA: Insufficient documentation

## 2024-08-01 NOTE — Assessment & Plan Note (Signed)
 Low-carb diet and exercise.  Follow met b and A1c.

## 2024-08-01 NOTE — Assessment & Plan Note (Signed)
 Seeing psychiatry. Doing much better. Feeling better. Working. Continues on cymbalta  and buspar . Follow.

## 2024-08-01 NOTE — Assessment & Plan Note (Signed)
 No cocaine since 12/2023. Doing well. Continue f/u with psychiatry.

## 2024-08-01 NOTE — Assessment & Plan Note (Signed)
Persistent pain.  Check xray.  

## 2024-08-01 NOTE — Assessment & Plan Note (Signed)
 Currently on buspar  and effexor . Overall feels better. Doing better. New job is going well. Follow.

## 2024-08-01 NOTE — Assessment & Plan Note (Signed)
 Low cholesterol diet and exercise. Have discussed statin medication previously. Follow lipid panel.

## 2024-08-01 NOTE — Assessment & Plan Note (Signed)
 No alcohol. Denies - none since 12/2023. Doing well. Follow.

## 2024-08-01 NOTE — Assessment & Plan Note (Signed)
Form for work completed  

## 2024-08-02 ENCOUNTER — Other Ambulatory Visit (HOSPITAL_COMMUNITY): Payer: Self-pay

## 2024-08-02 ENCOUNTER — Telehealth: Payer: Self-pay

## 2024-08-02 NOTE — Telephone Encounter (Signed)
 Pharmacy Patient Advocate Encounter   Received notification from Onbase that prior authorization for Ozempic  (0.25 or 0.5 MG/DOSE) 2MG /3ML pen-injectors is required/requested.   Insurance verification completed.   The patient is insured through [facility] .   Per test claim: PA required; PA submitted to above mentioned insurance via Latent Key/confirmation #/EOC [facility] Status is pending

## 2024-08-02 NOTE — Telephone Encounter (Signed)
 PA needed for Ozempic

## 2024-08-04 ENCOUNTER — Other Ambulatory Visit: Payer: Self-pay | Admitting: Psychiatry

## 2024-08-04 ENCOUNTER — Ambulatory Visit: Admitting: Professional Counselor

## 2024-08-04 ENCOUNTER — Other Ambulatory Visit (HOSPITAL_COMMUNITY): Payer: Self-pay

## 2024-08-04 DIAGNOSIS — F331 Major depressive disorder, recurrent, moderate: Secondary | ICD-10-CM

## 2024-08-04 NOTE — Telephone Encounter (Signed)
 Patient is aware.

## 2024-08-04 NOTE — Telephone Encounter (Signed)
 Pharmacy Patient Advocate Encounter  Received notification from [facility] that Prior Authorization for Ozempic  (0.25 or 0.5 MG/DOSE) 2MG /3ML pen-injectors  has been APPROVED from 08/02/24 to 08/02/25   PA #/Case ID/Reference #: [id number]

## 2024-08-11 ENCOUNTER — Ambulatory Visit: Admitting: Professional Counselor

## 2024-08-30 ENCOUNTER — Other Ambulatory Visit: Payer: Self-pay | Admitting: Psychiatry

## 2024-08-30 DIAGNOSIS — F331 Major depressive disorder, recurrent, moderate: Secondary | ICD-10-CM

## 2024-08-30 DIAGNOSIS — F411 Generalized anxiety disorder: Secondary | ICD-10-CM

## 2024-09-01 ENCOUNTER — Ambulatory Visit (INDEPENDENT_AMBULATORY_CARE_PROVIDER_SITE_OTHER): Admitting: Psychiatry

## 2024-09-01 ENCOUNTER — Encounter: Payer: Self-pay | Admitting: Psychiatry

## 2024-09-01 VITALS — BP 128/88 | HR 83 | Ht 63.0 in | Wt 183.0 lb

## 2024-09-01 DIAGNOSIS — F3341 Major depressive disorder, recurrent, in partial remission: Secondary | ICD-10-CM

## 2024-09-01 DIAGNOSIS — F411 Generalized anxiety disorder: Secondary | ICD-10-CM

## 2024-09-01 DIAGNOSIS — F1021 Alcohol dependence, in remission: Secondary | ICD-10-CM

## 2024-09-01 DIAGNOSIS — F1491 Cocaine use, unspecified, in remission: Secondary | ICD-10-CM | POA: Diagnosis not present

## 2024-09-01 MED ORDER — VENLAFAXINE HCL ER 75 MG PO CP24: 75.0000 mg | ORAL_CAPSULE | Freq: Every day | ORAL | 0 refills | Status: DC

## 2024-09-01 NOTE — Progress Notes (Unsigned)
 [facility] MD OP Progress Note  09/01/2024 1:41 PM Tracy Phillips  MRN:  [id number]  Chief Complaint:  Chief Complaint  Patient presents with   Follow-up   Anxiety   Depression   Medication Refill   Discussed the use of AI scribe software for clinical note transcription with the patient, who gave verbal consent to proceed.  History of Present Illness Tracy Phillips is a 58 year old here for evaluation of mood and anxiety symptoms.  She reports doing fairly well overall but continues to experience intermittent episodes of low mood, which she refers to as moody blues. She describes these episodes as a sense of doom, and they typically occur when she sits quietly or tries to fall asleep. She states these episodes last about 5 to 10 minutes and have occurred approximately 4 times since her last visit. She notes that she can talk herself out of these feelings and that no specific situational stressors trigger them. She feels that maintaining daily activities and staying busy helps her manage her mood.  She continues to experience ongoing anxiety. 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 She reports that her anxiety can be high at times, particularly due to the variability in her workday and the need to adapt to different situations and age groups. She continues to enjoy her work and finds it rewarding. For severe anxiety, she uses propranolol  as needed, stating she has taken it about 8 times in the past 3 to 4 months. She also continues to take venlafaxine  37.5 mg, buspirone  10 mg, and gabapentin  (which she recently refilled) as part of her current regimen. She is currently eating well.  She continues to have sleep difficulties, stating that she does not sleep through the night but feels she is sleeping well enough. She has attended 1 therapy session with her therapist but has found it difficult to schedule further sessions due to her work  schedule and the requirement for in-person visits.  Psychiatric History: She previously trialed higher doses of venlafaxine  for depression and anxiety, which led to some appetite loss but no other significant side effects. She attended at least 1 therapy session with a therapist prior to starting her current job.  Substance History: She denies current alcohol use.  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 She cleans and maintains her classroom daily, including vacuuming, sweeping, and mopping. She reports that the physical activity involved in her job has been beneficial.  Medical History: She has diabetes and chronic back pain. She has a history of hip injury.   Visit Diagnosis:    ICD-10-CM   1. Recurrent major depressive disorder, in partial remission  F33.41 venlafaxine  XR (EFFEXOR  XR) 75 MG 24 hr capsule   moderate    2. Generalized anxiety disorder  F41.1 venlafaxine  XR (EFFEXOR  XR) 75 MG 24 hr capsule    3. Alcohol use disorder, moderate, in early remission (HCC)  F10.21    13 years of sobriety with recent relapse in February 2025 currently in early remission    4. History of cocaine use  F14.91       Past Psychiatric History: I have reviewed the psychiatric history from progress note on 02/09/2024.  I have also reviewed past psychiatric history from my progress note on 04/27/2024.  Past Medical History:  Past Medical History:  Diagnosis Date   Alcohol abuse    Anemia  Anxiety    a.) on BZO (lorazepam ) PRN   Crack cocaine use    Demand ischemia (HCC)    a. 08/2023 HsTrop 107 in setting of crack cocaine; b. 08/2023 Echo: EF of 60-65%, without regional wall motion abnormalities, normal RV size and function, mild MR, and mild-moderate TR.   Depression    Family history of adverse reaction to anesthesia    a.) PONV in 1st degree relative (mother)   GERD (gastroesophageal reflux disease)    Gestational  diabetes    H/O dizziness    H/O eating disorder    Herpes    History of chicken pox    History of colon polyps    History of kidney stones    Hyperlipidemia    Vitamin B12 deficiency     Past Surgical History:  Procedure Laterality Date   ABDOMINAL HYSTERECTOMY  11/18/1996   APPENDECTOMY  11/19/1995   COLONOSCOPY     DILATION AND CURETTAGE OF UTERUS     multiple   TONSILLECTOMY  11/19/1995   TOTAL HIP ARTHROPLASTY Left 03/18/2024   Procedure: ARTHROPLASTY, HIP, TOTAL, ANTERIOR APPROACH;  Surgeon: Lorelle Hussar, MD;  Location: [facility] ORS;  Service: Orthopedics;  Laterality: Left;   WRIST FRACTURE SURGERY Left     Family Psychiatric History: I have reviewed family psychiatric history from progress note on 02/09/2024.  Family History:  Family History  Problem Relation Age of Onset   Depression Mother    Hyperlipidemia Mother    Hypertension Mother    Breast cancer Mother 23   Dementia Father    Arthritis Father    Hyperlipidemia Father    Hypertension Father    Diabetes Father    Depression Brother    Hypertension Brother    Depression Brother    Depression Maternal Aunt    Arthritis Maternal Aunt    Hyperlipidemia Maternal Aunt    Hypertension Maternal Aunt    Arthritis Paternal Aunt    Hyperlipidemia Paternal Aunt    Hypertension Paternal Aunt    Alcohol abuse Maternal Uncle    Arthritis Maternal Uncle    Hyperlipidemia Maternal Uncle    Hypertension Maternal Uncle    Arthritis Paternal Uncle    Hyperlipidemia Paternal Uncle    Hypertension Paternal Uncle    Arthritis Maternal Grandfather    Lung cancer Maternal Grandfather    Prostate cancer Maternal Grandfather    Hyperlipidemia Maternal Grandfather    Stroke Maternal Grandfather    Hypertension Maternal Grandfather    Arthritis Maternal Grandmother    Hyperlipidemia Maternal Grandmother    Hypertension Maternal Grandmother    Alcohol abuse Paternal Grandfather    Arthritis Paternal Grandfather     Hyperlipidemia Paternal Grandfather    Hypertension Paternal Grandfather    Sudden death Paternal Grandfather    Arthritis Paternal Grandmother    Hyperlipidemia Paternal Grandmother    Hypertension Paternal Grandmother     Social History: I have reviewed social history from progress note on 02/09/2024. Social History   Socioeconomic History   Marital status: Divorced    Spouse name: Not on file   Number of children: 3   Years of education: Not on file   Highest education level: Bachelor's degree (e.g., BA, AB, BS)  Occupational History   Not on file  Tobacco Use   Smoking status: Never   Smokeless tobacco: Never  Vaping Use   Vaping status: Never Used  Substance and Sexual Activity   Alcohol use: No   Drug use:  Not Currently    Types: Crack cocaine   Sexual activity: Not Currently  Other Topics Concern   Not on file  Social History Narrative   Not on file   Social Drivers of Health   Financial Resource Strain: High Risk (07/27/2024)   Overall Financial Resource Strain (CARDIA)    Difficulty of Paying Living Expenses: Hard  Food Insecurity: Food Insecurity Present (07/27/2024)   Hunger Vital Sign    Worried About Running Out of Food in the Last Year: Often true    Ran Out of Food in the Last Year: Sometimes true  Transportation Needs: No Transportation Needs (07/27/2024)   PRAPARE - Administrator, Civil Service (Medical): No    Lack of Transportation (Non-Medical): No  Physical Activity: Sufficiently Active (07/27/2024)   Exercise Vital Sign    Days of Exercise per Week: 5 days    Minutes of Exercise per Session: 90 min  Recent Concern: Physical Activity - Inactive (06/29/2024)   Exercise Vital Sign    Days of Exercise per Week: 0 days    Minutes of Exercise per Session: 0 min  Stress: Stress Concern Present (07/27/2024)   [employer] of Occupational Health - Occupational Stress Questionnaire    Feeling of Stress: To some extent  Social Connections:  Moderately Integrated (07/27/2024)   Social Connection and Isolation Panel    Frequency of Communication with Friends and Family: More than three times a week    Frequency of Social Gatherings with Friends and Family: Once a week    Attends Religious Services: More than 4 times per year    Active Member of [employer] or Organizations: Yes    Attends Banker Meetings: More than 4 times per year    Marital Status: Divorced  Recent Concern: Social Connections - Socially Isolated (06/29/2024)   Social Connection and Isolation Panel    Frequency of Communication with Friends and Family: Three times a week    Frequency of Social Gatherings with Friends and Family: Once a week    Attends Religious Services: Never    Database administrator or Organizations: No    Attends Banker Meetings: Never    Marital Status: Divorced    Allergies:  Allergies  Allergen Reactions   Aleve [Naproxen Sodium] Hives    Metabolic Disorder Labs: Lab Results  Component Value Date   HGBA1C 7.0 (H) 07/27/2024   MPG 111.15 09/11/2023   MPG 116.89 04/08/2023   No results found for: PROLACTIN Lab Results  Component Value Date   CHOL 251 (H) 07/27/2024   TRIG 174.0 (H) 07/27/2024   HDL 61.00 07/27/2024   CHOLHDL 4 07/27/2024   VLDL 34.8 07/27/2024   LDLCALC 155 (H) 07/27/2024   LDLCALC 128 (H) 09/12/2023   Lab Results  Component Value Date   TSH 3.79 02/24/2024   TSH 2.288 04/08/2023    Therapeutic Level Labs: No results found for: LITHIUM No results found for: VALPROATE No results found for: CBMZ  Current Medications: Current Outpatient Medications  Medication Sig Dispense Refill   busPIRone  (BUSPAR ) 10 MG tablet TAKE 1 TABLET BY MOUTH TWICE A DAY 180 tablet 1   celecoxib  (CELEBREX ) 200 MG capsule Take 200 mg by mouth 2 (two) times daily.     gabapentin  (NEURONTIN ) 600 MG tablet Take 1 tablet (600 mg total) by mouth at bedtime. Dose change 30 tablet 2   methocarbamol   (ROBAXIN ) 500 MG tablet Take 500 mg by mouth 4 (four)  times daily.     ondansetron  (ZOFRAN -ODT) 4 MG disintegrating tablet Allow 1-2 tablets to dissolve in your mouth every 8 hours as needed for nausea/vomiting 30 tablet 0   promethazine  (PHENERGAN ) 12.5 MG tablet Take 1 tablet (12.5 mg total) by mouth every 6 (six) hours as needed for nausea or vomiting. 30 tablet 0   propranolol  (INDERAL ) 10 MG tablet Take 1 tablet (10 mg total) by mouth 2 (two) times daily as needed. For severe anxiety attacks only 180 tablet 0   Semaglutide ,0.25 or 0.5MG /DOS, 2 MG/3ML SOPN Inject 0.25 mg into the skin once a week. 3 mL 2   venlafaxine  XR (EFFEXOR  XR) 75 MG 24 hr capsule Take 1 capsule (75 mg total) by mouth daily with breakfast. 90 capsule 0   No current facility-administered medications for this visit.     Musculoskeletal: Strength & Muscle Tone: within normal limits Gait & Station: normal Patient leans: N/A  Psychiatric Specialty Exam: Review of Systems  Psychiatric/Behavioral:  The patient is nervous/anxious.     Blood pressure 128/88, pulse 83, height 5' 3 (1.6 m), weight 183 lb (83 kg), SpO2 99%.Body mass index is 32.42 kg/m.  General Appearance: Casual  Eye Contact:  Fair  Speech:  Clear and Coherent  Volume:  Normal  Mood:  Anxious  Affect:  Congruent  Thought Process:  Goal Directed and Descriptions of Associations: Intact  Orientation:  Full (Time, Place, and Person)  Thought Content: Logical   Suicidal Thoughts:  No  Homicidal Thoughts:  No  Memory:  Immediate;   Fair Recent;   Fair Remote;   Fair  Judgement:  Fair  Insight:  Fair  Psychomotor Activity:  Normal  Concentration:  Concentration: Fair and Attention Span: Fair  Recall:  [employer] of Knowledge: Fair  Language: Fair  Akathisia:  No  Handed:  Left  AIMS (if indicated): not done  Assets:  Communication Skills Desire for Improvement Housing Social Support Transportation  ADL's:  Intact  Cognition: WNL   Sleep:  Fair   Screenings: AIMS    Flowsheet Row Office Visit from 04/27/2024 in [facility]  AIMS Total Score 0   AUDIT    Flowsheet Row [facility]  Alcohol Use Disorder Identification Test Final Score (AUDIT) 30   GAD-7    Flowsheet Row Counselor from 06/29/2024 in [facility] Office Visit from 06/09/2024 in [facility] Office Visit from 04/27/2024 in [facility] Office Visit from 02/09/2024 in [facility] Office Visit from 12/11/2023 in [facility] [employer]  Total GAD-7 Score 16 18 19 16 15    [employer]    Flowsheet Row Office Visit from 07/27/2024 in [facility] [employer] Visit from 07/08/2024 in [facility] Counselor from 06/29/2024 in [facility] Office Visit from 06/09/2024 in [facility] Office Visit from 04/27/2024 in [facility]  PHQ-2 Total Score 2 4 6 4 2   PHQ-9 Total Score 9 13 20 15 12    Flowsheet Row Office Visit from 07/08/2024 in [facility] Counselor from 06/29/2024 in [facility] Office Visit from 06/09/2024 in [facility]  C-SSRS RISK CATEGORY Moderate Risk Error: Q7 should  not be populated when Q6 is No Moderate Risk     Assessment and Plan:Tracy Phillips is a 58 year old Caucasian female who has a history of depression, anxiety was evaluated in office today, discussed assessment and plan as noted below.  1. Recurrent major depressive disorder, in  partial remission Currently reports overall mood symptoms as improved although continues to struggle with episodic sadness.  Reports sleep is overall good.  Denies any side effects to medications. Increase venlafaxine  extended release to 75 mg daily in the morning Continue BuSpar  10 mg twice a day for now.   2. Generalized anxiety disorder-unstable Currently does have anxiety mostly situational, work-related.  Unable to keep up with psychotherapy visits due to work schedule. Increase venlafaxine  extended release to 75 mg daily Continue BuSpar  as prescribed Continue gabapentin  300 mg at bedtime Continue propranolol  twice a day as needed for severe anxiety attacks. Encouraged to communicate with psychotherapist regarding her inability to keep up with therapy visits to come up with a plan.  3. Alcohol use disorder, moderate, in early remission (HCC) Continues to be sober.  Sober previously for 13 years with a relapse in February 2025. Will monitor closely  4. History of cocaine use Currently sober.  Follow-up Follow-up in clinic in 4 weeks or sooner if needed.  Patient/Guardian was advised Release of Information must be obtained prior to any record release in order to collaborate their care with an outside provider. Patient/Guardian was advised if they have not already done so to contact the registration department to sign all necessary forms in order for us  to release information regarding their care.   Consent: Patient/Guardian gives verbal consent for treatment and assignment of benefits for services provided during this visit. Patient/Guardian expressed understanding and agreed to proceed.   This note was generated in part or whole with voice recognition software. Voice recognition is usually quite accurate but there are transcription errors that can and very often do occur. I apologize for any typographical errors that were not detected and corrected.    Shannie Kontos,  MD 09/01/2024, 1:41 PM

## 2024-09-11 ENCOUNTER — Encounter: Payer: Self-pay | Admitting: Internal Medicine

## 2024-09-13 ENCOUNTER — Ambulatory Visit: Admitting: Internal Medicine

## 2024-09-13 MED ORDER — OZEMPIC (0.25 OR 0.5 MG/DOSE) 2 MG/3ML ~~LOC~~ SOPN: 0.5000 mg | PEN_INJECTOR | SUBCUTANEOUS | 2 refills | Status: DC

## 2024-09-13 NOTE — Telephone Encounter (Signed)
 Rx sent in for .5mg  ozempic . Notified by my chart - rx was sent in

## 2024-09-17 ENCOUNTER — Encounter: Admitting: Internal Medicine

## 2024-09-21 DIAGNOSIS — M25571 Pain in right ankle and joints of right foot: Secondary | ICD-10-CM | POA: Diagnosis not present

## 2024-09-21 DIAGNOSIS — M19071 Primary osteoarthritis, right ankle and foot: Secondary | ICD-10-CM | POA: Diagnosis not present

## 2024-09-21 DIAGNOSIS — M958 Other specified acquired deformities of musculoskeletal system: Secondary | ICD-10-CM | POA: Diagnosis not present

## 2024-09-27 ENCOUNTER — Other Ambulatory Visit: Payer: Self-pay | Admitting: Psychiatry

## 2024-09-27 DIAGNOSIS — F331 Major depressive disorder, recurrent, moderate: Secondary | ICD-10-CM

## 2024-09-27 DIAGNOSIS — F411 Generalized anxiety disorder: Secondary | ICD-10-CM

## 2024-09-28 ENCOUNTER — Ambulatory Visit: Admitting: Professional Counselor

## 2024-09-29 ENCOUNTER — Telehealth (INDEPENDENT_AMBULATORY_CARE_PROVIDER_SITE_OTHER): Admitting: Psychiatry

## 2024-09-29 ENCOUNTER — Encounter: Payer: Self-pay | Admitting: Psychiatry

## 2024-09-29 DIAGNOSIS — F3341 Major depressive disorder, recurrent, in partial remission: Secondary | ICD-10-CM

## 2024-09-29 DIAGNOSIS — F1021 Alcohol dependence, in remission: Secondary | ICD-10-CM | POA: Diagnosis not present

## 2024-09-29 DIAGNOSIS — F3342 Major depressive disorder, recurrent, in full remission: Secondary | ICD-10-CM | POA: Diagnosis not present

## 2024-09-29 DIAGNOSIS — F411 Generalized anxiety disorder: Secondary | ICD-10-CM | POA: Diagnosis not present

## 2024-09-29 DIAGNOSIS — F1491 Cocaine use, unspecified, in remission: Secondary | ICD-10-CM | POA: Diagnosis not present

## 2024-09-29 MED ORDER — VENLAFAXINE HCL ER 150 MG PO CP24: 150.0000 mg | ORAL_CAPSULE | Freq: Every day | ORAL | 0 refills | Status: AC

## 2024-09-29 NOTE — Progress Notes (Signed)
 Virtual Visit via Video Note  I connected with Tracy Phillips on 09/29/24 at  1:20 PM EST by a video enabled telemedicine application and verified that I am speaking with the correct person using two identifiers.  Location Provider Location : [facility] Patient Location : Work  Participants: Patient , Provider    I discussed the limitations of evaluation and management by telemedicine and the availability of in person appointments. The patient expressed understanding and agreed to proceed.   I discussed the assessment and treatment plan with the patient. The patient was provided an opportunity to ask questions and all were answered. The patient agreed with the plan and demonstrated an understanding of the instructions.   The patient was advised to call back or seek an in-person evaluation if the symptoms worsen or if the condition fails to improve as anticipated.  BH MD OP Progress Note  09/29/2024 1:58 PM Tracy Phillips  MRN:  [id number]  Chief Complaint:  Chief Complaint  Patient presents with   Medication Refill   Follow-up   Depression   Anxiety   Discussed the use of AI scribe software for clinical note transcription with the patient, who gave verbal consent to proceed.  History of Present Illness Tracy Phillips is a 57 year old Caucasian female, divorced, currently lives in [place], has a history of MDD, GAD, renal stones, hyperglycemia, hypercholesterolemia, hip pain, gastroesophageal reflux disease, history of significant use of alcohol with alcohol withdrawal symptoms in the past was evaluated by telemedicine today.  Ongoing anxiety continues to affect her, with persistent difficulty managing stress and feeling unable to control her anxiety. She identifies stress as a primary trigger and notes that her anxiety manifests predominantly as mental symptoms, though she experiences sweating. She has not been taking her as-needed anxiety medication the propranolol , reporting  insufficient relief and anticipating side effects. She currently takes buspirone  10 mg twice daily for anxiety as well as is compliant on the venlafaxine .  Describing her depression as significantly improved and on the mend, she notes fewer low days compared to when she started treatment. She currently takes venlafaxine , recently increased to 75 mg daily. She also takes gabapentin  600 mg at bedtime.  She denies side effects.  Currently sleep is overall good.  She denies any thoughts of hurting herself or others.  She denies any alcohol use.  She has upcoming appointment scheduled with Ms. Veva to start psychotherapy sessions.  She is motivated to keep it.    Visit Diagnosis:    ICD-10-CM   1. Recurrent major depressive disorder, in full remission  F33.42 venlafaxine  XR (EFFEXOR  XR) 150 MG 24 hr capsule   Moderate    2. Generalized anxiety disorder  F41.1 venlafaxine  XR (EFFEXOR  XR) 150 MG 24 hr capsule    3. Alcohol use disorder, moderate, in early remission (HCC)  F10.21    13 years of sobriety with recent relapse in February 2025 currently in early remission    4. History of cocaine use  F14.91       Past Psychiatric History: I have reviewed past psychiatric history from progress note on 02/09/2024.  I have also reviewed past psychiatric history from progress note on 04/27/2024.  Past Medical History:  Past Medical History:  Diagnosis Date   Alcohol abuse    Anemia    Anxiety    a.) on BZO (lorazepam ) PRN   Crack cocaine use    Demand ischemia (HCC)    a. 08/2023 HsTrop 107 in setting of  crack cocaine; b. 08/2023 Echo: EF of 60-65%, without regional wall motion abnormalities, normal RV size and function, mild MR, and mild-moderate TR.   Depression    Family history of adverse reaction to anesthesia    a.) PONV in 1st degree relative (mother)   GERD (gastroesophageal reflux disease)    Gestational diabetes    H/O dizziness    H/O eating disorder    Herpes    History  of chicken pox    History of colon polyps    History of kidney stones    Hyperlipidemia    Vitamin B12 deficiency     Past Surgical History:  Procedure Laterality Date   ABDOMINAL HYSTERECTOMY  11/18/1996   APPENDECTOMY  11/19/1995   COLONOSCOPY     DILATION AND CURETTAGE OF UTERUS     multiple   TONSILLECTOMY  11/19/1995   TOTAL HIP ARTHROPLASTY Left 03/18/2024   Procedure: ARTHROPLASTY, HIP, TOTAL, ANTERIOR APPROACH;  Surgeon: Lorelle Hussar, MD;  Location: [facility];  Service: Orthopedics;  Laterality: Left;   WRIST FRACTURE SURGERY Left     Family Psychiatric History: I have reviewed family psychiatric history from progress note on 02/09/2024.  Family History:  Family History  Problem Relation Age of Onset   Depression Mother    Hyperlipidemia Mother    Hypertension Mother    Breast cancer Mother 78   Dementia Father    Arthritis Father    Hyperlipidemia Father    Hypertension Father    Diabetes Father    Depression Brother    Hypertension Brother    Depression Brother    Depression Maternal Aunt    Arthritis Maternal Aunt    Hyperlipidemia Maternal Aunt    Hypertension Maternal Aunt    Arthritis Paternal Aunt    Hyperlipidemia Paternal Aunt    Hypertension Paternal Aunt    Alcohol abuse Maternal Uncle    Arthritis Maternal Uncle    Hyperlipidemia Maternal Uncle    Hypertension Maternal Uncle    Arthritis Paternal Uncle    Hyperlipidemia Paternal Uncle    Hypertension Paternal Uncle    Arthritis Maternal Grandfather    Lung cancer Maternal Grandfather    Prostate cancer Maternal Grandfather    Hyperlipidemia Maternal Grandfather    Stroke Maternal Grandfather    Hypertension Maternal Grandfather    Arthritis Maternal Grandmother    Hyperlipidemia Maternal Grandmother    Hypertension Maternal Grandmother    Alcohol abuse Paternal Grandfather    Arthritis Paternal Grandfather    Hyperlipidemia Paternal Grandfather    Hypertension Paternal Grandfather     Sudden death Paternal Grandfather    Arthritis Paternal Grandmother    Hyperlipidemia Paternal Grandmother    Hypertension Paternal Grandmother     Social History: I have reviewed social history from progress note on 02/09/2024. Social History   Socioeconomic History   Marital status: Divorced    Spouse name: Not on file   Number of children: 3   Years of education: Not on file   Highest education level: Bachelor's degree (e.g., BA, AB, BS)  Occupational History   Not on file  Tobacco Use   Smoking status: Never   Smokeless tobacco: Never  Vaping Use   Vaping status: Never Used  Substance and Sexual Activity   Alcohol use: No   Drug use: Not Currently    Types: Crack cocaine   Sexual activity: Not Currently  Other Topics Concern   Not on file  Social History Narrative   Not  on file   Social Drivers of Health   Financial Resource Strain: Medium Risk (09/21/2024)   Received from [facility]   Overall Financial Resource Strain (CARDIA)    Difficulty of Paying Living Expenses: Somewhat hard  Food Insecurity: Food Insecurity Present (09/21/2024)   Received from [facility]   Hunger Vital Sign    Within the past 12 months, you worried that your food would run out before you got the money to buy more.: Sometimes true    Within the past 12 months, the food you bought just didn't last and you didn't have money to get more.: Sometimes true  Transportation Needs: No Transportation Needs (09/21/2024)   Received from [facility] Transportation    In the past 12 months, has lack of transportation kept you from medical appointments or from getting medications?: No    Lack of Transportation (Non-Medical): No  Physical Activity: Sufficiently Active (07/27/2024)   Exercise Vital Sign    Days of Exercise per Week: 5 days    Minutes of Exercise per Session: 90 min  Recent Concern: Physical Activity - Inactive (06/29/2024)    Exercise Vital Sign    Days of Exercise per Week: 0 days    Minutes of Exercise per Session: 0 min  Stress: Stress Concern Present (07/27/2024)   [employer] of Occupational Health - Occupational Stress Questionnaire    Feeling of Stress: To some extent  Social Connections: Moderately Integrated (07/27/2024)   Social Connection and Isolation Panel    Frequency of Communication with Friends and Family: More than three times a week    Frequency of Social Gatherings with Friends and Family: Once a week    Attends Religious Services: More than 4 times per year    Active Member of [employer] or Organizations: Yes    Attends Banker Meetings: More than 4 times per year    Marital Status: Divorced  Recent Concern: Social Connections - Socially Isolated (06/29/2024)   Social Connection and Isolation Panel    Frequency of Communication with Friends and Family: Three times a week    Frequency of Social Gatherings with Friends and Family: Once a week    Attends Religious Services: Never    Database Administrator or Organizations: No    Attends Banker Meetings: Never    Marital Status: Divorced    Allergies:  Allergies  Allergen Reactions   Aleve [Naproxen Sodium] Hives    Metabolic Disorder Labs: Lab Results  Component Value Date   HGBA1C 7.0 (H) 07/27/2024   MPG 111.15 09/11/2023   MPG 116.89 04/08/2023   No results found for: PROLACTIN Lab Results  Component Value Date   CHOL 251 (H) 07/27/2024   TRIG 174.0 (H) 07/27/2024   HDL 61.00 07/27/2024   CHOLHDL 4 07/27/2024   VLDL 34.8 07/27/2024   LDLCALC 155 (H) 07/27/2024   LDLCALC 128 (H) 09/12/2023   Lab Results  Component Value Date   TSH 3.79 02/24/2024   TSH 2.288 04/08/2023    Therapeutic Level Labs: No results found for: LITHIUM No results found for: VALPROATE No results found for: CBMZ  Current Medications: Current Outpatient Medications  Medication Sig Dispense Refill    venlafaxine  XR (EFFEXOR  XR) 150 MG 24 hr capsule Take 1 capsule (150 mg total) by mouth daily with breakfast. 90 capsule 0   busPIRone  (BUSPAR ) 10 MG tablet TAKE 1 TABLET BY MOUTH TWICE A DAY  180 tablet 1   celecoxib  (CELEBREX ) 200 MG capsule Take 200 mg by mouth 2 (two) times daily.     gabapentin  (NEURONTIN ) 600 MG tablet Take 1 tablet (600 mg total) by mouth at bedtime. Dose change 30 tablet 2   methocarbamol  (ROBAXIN ) 500 MG tablet Take 500 mg by mouth 4 (four) times daily.     ondansetron  (ZOFRAN -ODT) 4 MG disintegrating tablet Allow 1-2 tablets to dissolve in your mouth every 8 hours as needed for nausea/vomiting 30 tablet 0   promethazine  (PHENERGAN ) 12.5 MG tablet Take 1 tablet (12.5 mg total) by mouth every 6 (six) hours as needed for nausea or vomiting. 30 tablet 0   propranolol  (INDERAL ) 10 MG tablet Take 1 tablet (10 mg total) by mouth 2 (two) times daily as needed. For severe anxiety attacks only 180 tablet 1   Semaglutide ,0.25 or 0.5MG /DOS, (OZEMPIC , 0.25 OR 0.5 MG/DOSE,) 2 MG/3ML SOPN Inject 0.5 mg into the skin once a week. 3 mL 2   No current facility-administered medications for this visit.     Musculoskeletal: Strength & Muscle Tone: UTA Gait & Station: Seated Patient leans: N/A  Psychiatric Specialty Exam: Review of Systems  Psychiatric/Behavioral:  Positive for sleep disturbance. The patient is nervous/anxious.     There were no vitals taken for this visit.There is no height or weight on file to calculate BMI.  General Appearance: Casual  Eye Contact:  Fair  Speech:  Clear and Coherent  Volume:  Normal  Mood:  Anxious  Affect:  Congruent  Thought Process:  Goal Directed and Descriptions of Associations: Intact  Orientation:  Full (Time, Place, and Person)  Thought Content: Logical   Suicidal Thoughts:  No  Homicidal Thoughts:  No  Memory:  Immediate;   Fair Recent;   Fair Remote;   Fair  Judgement:  Fair  Insight:  Fair  Psychomotor Activity:  Normal   Concentration:  Concentration: Fair and Attention Span: Fair  Recall:  [employer] of Knowledge: Fair  Language: Fair  Akathisia:  No  Handed:  Right  AIMS (if indicated): not done  Assets:  Communication Skills Desire for Improvement Housing Social Support Transportation  ADL's:  Intact  Cognition: WNL  Sleep:  Improving   Screenings: AIMS    Flowsheet Row Office Visit from 04/27/2024 in [facility]  AIMS Total Score 0   AUDIT    Flowsheet Row [facility]  Alcohol Use Disorder Identification Test Final Score (AUDIT) 30   GAD-7    Flowsheet Row Office Visit from 09/01/2024 in [facility] Counselor from 06/29/2024 in [facility] Office Visit from 06/09/2024 in [facility] Office Visit from 04/27/2024 in [facility] Office Visit from 02/09/2024 in [facility]  Total GAD-7 Score 9 16 18 19 16    PHQ2-9    Flowsheet Row Office Visit from 09/01/2024 in [facility] Office Visit from 07[facility]27/2024 in [facility] Visit from 07/08/2024 in [facility] Counselor from 06/29/2024 in [facility] Office Visit from 06/09/2024 in [facility]  PHQ-2 Total Score 1 2 4 6 4   PHQ-9 Total Score -- 9 13 20 15    Flowsheet Row Video Visit from 09/29/2024 in [facility] Office Visit from 09/01/2024 in [facility] Office Visit from 07/08/2024 in [facility]  C-SSRS RISK  CATEGORY Moderate Risk Moderate Risk Moderate Risk     Assessment and Plan: Tracy Phillips is a 58 year old Caucasian female who has a history of depression, anxiety was evaluated by telemedicine today.  Discussed assessment and plan as noted below.  1. Recurrent major depressive disorder, in full remission Currently reports depression symptoms is improved on the current medication regimen. Continue Venlafaxine  as prescribed Continue BuSpar  10 mg twice a day  2. Generalized anxiety disorder-unstable Continues to have anxiety symptoms.  Does not believe propranolol  as helpful with internal anxiety.  She does have upcoming appointment with therapist and plans to keep it to start CBT Increase Venlafaxine  extended release to 150 mg daily Continue Gabapentin  600 mg at bedtime Continue BuSpar  as prescribed Continue Propranolol  twice a day as needed for severe anxiety attacks. Encouraged to keep the appointment for CBT with Ms. Almarie Ligas to work on coping strategies.  3. Alcohol use disorder, moderate, in early remission (HCC) Continues to be sober. Will monitor closely  4. History of cocaine use Currently sober  Follow-up Follow-up in clinic in 3 to 4 weeks or sooner if needed.    Collaboration of Care: Collaboration of Care: Referral or follow-up with counselor/therapist AEB encouraged to keep appointment with Ms. Ligas  Patient/Guardian was advised Release of Information must be obtained prior to any record release in order to collaborate their care with an outside provider. Patient/Guardian was advised if they have not already done so to contact the registration department to sign all necessary forms in order for us  to release information regarding their care.   Consent: Patient/Guardian gives verbal consent for treatment and assignment of benefits for services provided during this visit. Patient/Guardian expressed understanding and agreed to proceed.   This note was  generated in part or whole with voice recognition software. Voice recognition is usually quite accurate but there are transcription errors that can and very often do occur. I apologize for any typographical errors that were not detected and corrected.    Mitcheal Sweetin, MD 09/30/2024, 2:51 PM

## 2024-10-01 DIAGNOSIS — J209 Acute bronchitis, unspecified: Secondary | ICD-10-CM | POA: Diagnosis not present

## 2024-10-01 DIAGNOSIS — Z03818 Encounter for observation for suspected exposure to other biological agents ruled out: Secondary | ICD-10-CM | POA: Diagnosis not present

## 2024-10-01 DIAGNOSIS — H1032 Unspecified acute conjunctivitis, left eye: Secondary | ICD-10-CM | POA: Diagnosis not present

## 2024-10-01 DIAGNOSIS — J029 Acute pharyngitis, unspecified: Secondary | ICD-10-CM | POA: Diagnosis not present

## 2024-10-01 DIAGNOSIS — J019 Acute sinusitis, unspecified: Secondary | ICD-10-CM | POA: Diagnosis not present

## 2024-10-12 ENCOUNTER — Ambulatory Visit (INDEPENDENT_AMBULATORY_CARE_PROVIDER_SITE_OTHER): Admitting: Professional Counselor

## 2024-10-12 DIAGNOSIS — F411 Generalized anxiety disorder: Secondary | ICD-10-CM

## 2024-10-12 DIAGNOSIS — F3341 Major depressive disorder, recurrent, in partial remission: Secondary | ICD-10-CM

## 2024-10-12 NOTE — Progress Notes (Signed)
  THERAPIST PROGRESS NOTE  Session Time: 8:05 AM - 8:50 AM   Participation Level: Active  Behavioral Response: Well Groomed, Alert, Euthymic  Type of Therapy: Individual Therapy  Treatment Goals addressed: Active Anxiety  LTG: There is a root cause that I'm unaware of, that still makes me feel like everybody else in the world has the key, to happiness, to joy, and I feel like I'm always searching for that. So I would love to be able to work through what that is.    Start:  10/12/24    Expected End:  10/11/25     STG: Any kind of suggestions for me to navigate and stay in the 24 hours that I'm in. To improve mindfulness AEB utilizing mindfulness skills 3 out of 7 days a week for the next 12 weeks.    STG: Being God conscious. Tapping into my faith more rather than self-reliance. I can over think the crap out of things. To improve mindset AEB restructuring maladaptive patterns of thinking over the next 12 weeks.    STG: To get out and enjoy the things that I used to. To improve socialization AEB engaging in recreation activities twice a month for the next three months.    ProgressTowards Goals: Initial  Interventions: CBT, Motivational Interviewing, and Supportive  Summary: CAMERON KATAYAMA is a 58 y.o. female who presents with a history of anxiety and depression. She appeared alert and oriented x5. 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 She noted other improvements, like socialization activities. Katelynn engaged in developing her treatment plan. She actively listened to coping skills and identified things she is already doing on her own. She engaged in 5-4-3-2-1 grounding technique. She was in agreement to start practicing skills.   Therapist Response: Conducted session with Luke. Began session with check-in/update since previous session. Utilized empathetic and reflective listening. Used open-ended questions to facilitate discussion  and summarized Tashika's thoughts/feelings. Developed treatment plan with input from Ginger Blue on current strengths and needs. Provided psychoeducation on coping skills. Engaged Anamarie in technical brewer. Encouraged Luke to practice consistently. Scheduled additional appointment and concluded session.   Suicidal/Homicidal: No  Plan: Return again in 2 weeks.  Diagnosis: Generalized anxiety disorder  Recurrent major depressive disorder, in partial remission  Collaboration of Care: Medication Management AEB chart review  Patient/Guardian was advised Release of Information must be obtained prior to any record release in order to collaborate their care with an outside provider. Patient/Guardian was advised if they have not already done so to contact the registration department to sign all necessary forms in order for us  to release information regarding their care.   Consent: Patient/Guardian gives verbal consent for treatment and assignment of benefits for services provided during this visit. Patient/Guardian expressed understanding and agreed to proceed.   Almarie JONETTA Ligas, [facility] 10/12/2024

## 2024-10-15 ENCOUNTER — Other Ambulatory Visit: Payer: Self-pay | Admitting: Internal Medicine

## 2024-10-28 ENCOUNTER — Ambulatory Visit: Admitting: Professional Counselor

## 2024-11-01 ENCOUNTER — Ambulatory Visit: Admitting: Professional Counselor

## 2024-11-03 ENCOUNTER — Telehealth: Admitting: Psychiatry

## 2024-11-03 ENCOUNTER — Encounter: Payer: Self-pay | Admitting: Psychiatry

## 2024-11-03 DIAGNOSIS — F1021 Alcohol dependence, in remission: Secondary | ICD-10-CM | POA: Diagnosis not present

## 2024-11-03 DIAGNOSIS — F3342 Major depressive disorder, recurrent, in full remission: Secondary | ICD-10-CM | POA: Insufficient documentation

## 2024-11-03 DIAGNOSIS — G4700 Insomnia, unspecified: Secondary | ICD-10-CM | POA: Insufficient documentation

## 2024-11-03 DIAGNOSIS — F411 Generalized anxiety disorder: Secondary | ICD-10-CM | POA: Diagnosis not present

## 2024-11-03 MED ORDER — RAMELTEON 8 MG PO TABS: 8.0000 mg | ORAL_TABLET | Freq: Every day | ORAL | 0 refills | Status: DC

## 2024-11-03 NOTE — Patient Instructions (Signed)
 Ramelteon Tablets What is this medication? RAMELTEON (ram EL tee on) treats insomnia. It helps you go to sleep faster. This medicine may be used for other purposes; ask your health care provider or pharmacist if you have questions. COMMON BRAND NAME(S): Rozerem What should I tell my care team before I take this medication? They need to know if you have any of these conditions: Liver disease Lung or breathing disease, such as asthma or COPD Mental health condition Substance use disorder Sleep apnea Suicidal thoughts, plans, or attempt by you or a family member An unusual or allergic reaction to ramelteon, other medications, foods, dyes, or preservatives Pregnant or trying to get pregnant Breast-feeding How should I use this medication? Take this medication by mouth with water. Take it as directed on the prescription label and only when you are ready for bed. Do not cut or break this medication. Swallow the tablets whole. Do not take it with or right after a meal. A special MedGuide will be given to you by the pharmacist with each prescription and refill. Be sure to read this information carefully each time. Talk to your care team about the use of this medication in children. It is not approved for use in children. Overdosage: If you think you have taken too much of this medicine contact a poison control center or emergency room at once. NOTE: This medicine is only for you. Do not share this medicine with others. What if I miss a dose? This does not apply. This medication should only be taken immediately before going to sleep. Do not take double or extra doses. What may interact with this medication? Do not take this medication with any of the following: Fluvoxamine Melatonin Tasimelteon Viloxazine This medication may also interact with the following: Alcohol Certain medications for fungal infections, such as ketoconazole, fluconazole,  itraconazole Ciprofloxacin Donepezil Doxepin Other medications for sleep Rifampin This list may not describe all possible interactions. Give your health care provider a list of all the medicines, herbs, non-prescription drugs, or dietary supplements you use. Also tell them if you smoke, drink alcohol, or use illegal drugs. Some items may interact with your medicine. What should I watch for while using this medication? Visit your care team for regular checks on your progress. Tell your care team if your symptoms do not start to get better or if they get worse. Plan to go to bed and stay in bed for a full night (7 to 8 hours) after you take this medication. You may still be drowsy the morning after taking this medication. This medication may affect your coordination, reaction time, or judgment. Do not drive or operate machinery until you know how this medication affects you. Sit up or stand slowly to reduce the risk of dizzy or fainting spells. You may do unusual sleep behaviors or activities you do not remember the day after taking this medication. Activities include driving, making or eating food, talking on the phone, sexual activity, or sleep walking. Stop taking this medication and call your care team right away if you find out you have done activities like this. If you or your family notice any changes in your behavior, such as new or worsening depression, thoughts of harming yourself, anxiety, other unusual or disturbing thoughts, or memory loss, call your care team right away. After you stop taking this medication, you may have trouble falling asleep. This is called rebound insomnia. This problem usually goes away on its own after 1 or 2 nights. What  side effects may I notice from receiving this medication? Side effects that you should report to your care team as soon as possible: Allergic reactions or angioedema--skin rash, itching, hives, swelling of the face, lips, tongue, arms, or legs,  trouble swallowing or breathing High prolactin level--unexpected breast tissue growth, discharge from the nipple, change in sex drive or performance, irregular menstrual cycle Mood and behavior changes--anxiety, nervousness, confusion, hallucinations, irritability, hostility, thoughts of suicide or self-harm, worsening mood, feelings of depression Unusual sleep behaviors or activities you do not remember such as driving, eating, or sexual activity Side effects that usually do not require medical attention (report to your care team if they continue or are bothersome): Dizziness Drowsiness the day after use Fatigue This list may not describe all possible side effects. Call your doctor for medical advice about side effects. You may report side effects to FDA at 1-800-FDA-1088. Where should I keep my medication? Keep out of the reach of children and pets. Store at room temperature between 15 and 30 degrees C (59 and 86 degrees F). Protect from light and moisture. Keep the container tightly closed. Get rid of any unused medication after the expiration date. To get rid of medications that are no longer needed or have expired: Take the medication to a medication take-back program. Check with your pharmacy or law enforcement to find a location. If you cannot return the medication, check the label or package insert to see if the medication should be thrown out in the garbage or flushed down the toilet. If you are not sure, ask your care team. If it is safe to put it in the trash, take the medication out of the container. Mix the medication with cat litter, dirt, coffee grounds, or other unwanted substance. Seal the mixture in a bag or container. Put it in the trash. NOTE: This sheet is a summary. It may not cover all possible information. If you have questions about this medicine, talk to your doctor, pharmacist, or health care provider.  2024 Elsevier/Gold Standard (2023-05-23 00:00:00)

## 2024-11-03 NOTE — Progress Notes (Signed)
 Virtual Visit via Video Note  I connected with Tracy Phillips on 11/03/2024 at  8:30 AM EST by a video enabled telemedicine application and verified that I am speaking with the correct person using two identifiers.  Location Provider Location : [facility] Patient Location : Car  Participants: Patient , Provider    I discussed the limitations of evaluation and management by telemedicine and the availability of in person appointments. The patient expressed understanding and agreed to proceed.   I discussed the assessment and treatment plan with the patient. The patient was provided an opportunity to ask questions and all were answered. The patient agreed with the plan and demonstrated an understanding of the instructions.   The patient was advised to call back or seek an in-person evaluation if the symptoms worsen or if the condition fails to improve as anticipated.   BH MD OP Progress Note  11/03/2024 8:59 AM Tracy Phillips  MRN:  [id number]  Chief Complaint:  Chief Complaint  Patient presents with   Medication Refill   Follow-up   Anxiety   Depression   Discussed the use of AI scribe software for clinical note transcription with the patient, who gave verbal consent to proceed.  History of Present Illness Tracy Phillips is a 58 year old Caucasian female, divorced, currently lives in [place], has a history of MDD, GAD, renal stones, hyperglycemia, hypercholesterolemia, hip pain, gastroesophageal reflux disease, history of significant use of alcohol with alcohol withdrawal symptoms in the past was evaluated by telemedicine today.  She reports that her depression remains well controlled and describes feeling able to manage recent stressors effectively. Periods of 'doom and gloom' occurred over the past weekend, lasting for hours but resolving with her coping techniques, and she currently feels positive about life. She notes that her improved functioning results from her current  medication regimen and states that she was previously unable to function prior to these treatments.  Ongoing anxiety persists, with difficulty managing stress and physical symptoms such as sweating during stressful periods. She reports that her anxiety continues but feels that her current strategies and medications help her manage these symptoms. Her current medications include venlafaxine  150 mg, gabapentin  600 mg, and Buspar  10 mg. She notes that the increase in venlafaxine  initially caused headaches, which she reports have started to subside. She mentions considering a reduction in gabapentin  in the future. She avoids using propranolol  as needed because it made her feel worse after taking it . She denies any thoughts of hurting herself or others.  Sleep difficulties continue, with poor sleep most nights. She notes that she received a medication for her back pain yesterday, which allowed her to sleep well for 1 night, but states that her sleep issues are not solely due to back pain and may also relate to persistent thoughts and other physical factors.  Previous trials of trazodone , melatonin did not work.  Severe arthritis and back pain impact her sleep and daily functioning. She notes that she attended a recent medical appointment for her back pain. She also mentions concerns about weight gain affecting her back and joints, and is considering discontinuing Ozempic .      Visit Diagnosis:    ICD-10-CM   1. Recurrent major depressive disorder, in full remission  F33.42     2. Generalized anxiety disorder  F41.1     3. Insomnia, unspecified type  G47.00 ramelteon  (ROZEREM ) 8 MG tablet    4. Alcohol use disorder, moderate, in early remission (HCC)  F10.21  13 years of sobriety with recent relapse in February 2025 currently in early remission      Past Psychiatric History: I have reviewed my progress note from 02/09/2024.  I have reviewed my progress note on 04/27/2024.  Past Medical  History:  Past Medical History:  Diagnosis Date   Alcohol abuse    Anemia    Anxiety    a.) on BZO (lorazepam ) PRN   Crack cocaine use    Demand ischemia (HCC)    a. 08/2023 HsTrop 107 in setting of crack cocaine; b. 08/2023 Echo: EF of 60-65%, without regional wall motion abnormalities, normal RV size and function, mild MR, and mild-moderate TR.   Depression    Family history of adverse reaction to anesthesia    a.) PONV in 1st degree relative (mother)   GERD (gastroesophageal reflux disease)    Gestational diabetes    H/O dizziness    H/O eating disorder    Herpes    History of chicken pox    History of colon polyps    History of kidney stones    Hyperlipidemia    Vitamin B12 deficiency     Past Surgical History:  Procedure Laterality Date   ABDOMINAL HYSTERECTOMY  11/18/1996   APPENDECTOMY  11/19/1995   COLONOSCOPY     DILATION AND CURETTAGE OF UTERUS     multiple   TONSILLECTOMY  11/19/1995   TOTAL HIP ARTHROPLASTY Left 03/18/2024   Procedure: ARTHROPLASTY, HIP, TOTAL, ANTERIOR APPROACH;  Surgeon: Lorelle Hussar, MD;  Location: [facility] ORS;  Service: Orthopedics;  Laterality: Left;   WRIST FRACTURE SURGERY Left     Family Psychiatric History: I have reviewed my progress note on 02/09/2024.  Family History:  Family History  Problem Relation Age of Onset   Depression Mother    Hyperlipidemia Mother    Hypertension Mother    Breast cancer Mother 85   Dementia Father    Arthritis Father    Hyperlipidemia Father    Hypertension Father    Diabetes Father    Depression Brother    Hypertension Brother    Depression Brother    Depression Maternal Aunt    Arthritis Maternal Aunt    Hyperlipidemia Maternal Aunt    Hypertension Maternal Aunt    Arthritis Paternal Aunt    Hyperlipidemia Paternal Aunt    Hypertension Paternal Aunt    Alcohol abuse Maternal Uncle    Arthritis Maternal Uncle    Hyperlipidemia Maternal Uncle    Hypertension Maternal Uncle    Arthritis  Paternal Uncle    Hyperlipidemia Paternal Uncle    Hypertension Paternal Uncle    Arthritis Maternal Grandfather    Lung cancer Maternal Grandfather    Prostate cancer Maternal Grandfather    Hyperlipidemia Maternal Grandfather    Stroke Maternal Grandfather    Hypertension Maternal Grandfather    Arthritis Maternal Grandmother    Hyperlipidemia Maternal Grandmother    Hypertension Maternal Grandmother    Alcohol abuse Paternal Grandfather    Arthritis Paternal Grandfather    Hyperlipidemia Paternal Grandfather    Hypertension Paternal Grandfather    Sudden death Paternal Grandfather    Arthritis Paternal Grandmother    Hyperlipidemia Paternal Grandmother    Hypertension Paternal Grandmother     Social History: I have reviewed social history from my progress note on 02/09/2024. Social History   Socioeconomic History   Marital status: Divorced    Spouse name: Not on file   Number of children: 3   Years of  education: Not on file   Highest education level: Bachelor's degree (e.g., BA, AB, BS)  Occupational History   Not on file  Tobacco Use   Smoking status: Never   Smokeless tobacco: Never  Vaping Use   Vaping status: Never Used  Substance and Sexual Activity   Alcohol use: No   Drug use: Not Currently    Types: Crack cocaine   Sexual activity: Not Currently  Other Topics Concern   Not on file  Social History Narrative   Not on file   Social Drivers of Health   Tobacco Use: Low Risk (11/03/2024)   Patient History    Smoking Tobacco Use: Never    Smokeless Tobacco Use: Never    Passive Exposure: Not on file  Financial Resource Strain: Medium Risk (11/02/2024)   Received from [facility]   Overall Financial Resource Strain (CARDIA)    Difficulty of Paying Living Expenses: Somewhat hard  Food Insecurity: Food Insecurity Present (11/02/2024)   Received from [facility]   Epic    Within the past 12 months, you worried that  your food would run out before you got the money to buy more.: Sometimes true    Within the past 12 months, the food you bought just didn't last and you didn't have money to get more.: Sometimes true  Transportation Needs: No Transportation Needs (11/02/2024)   Received from [facility] - Transportation    In the past 12 months, has lack of transportation kept you from medical appointments or from getting medications?: No    Lack of Transportation (Non-Medical): No  Physical Activity: Sufficiently Active (07/27/2024)   Exercise Vital Sign    Days of Exercise per Week: 5 days    Minutes of Exercise per Session: 90 min  Recent Concern: Physical Activity - Inactive (06/29/2024)   Exercise Vital Sign    Days of Exercise per Week: 0 days    Minutes of Exercise per Session: 0 min  Stress: Stress Concern Present (07/27/2024)   [employer] of Occupational Health - Occupational Stress Questionnaire    Feeling of Stress: To some extent  Social Connections: Moderately Integrated (07/27/2024)   Social Connection and Isolation Panel    Frequency of Communication with Friends and Family: More than three times a week    Frequency of Social Gatherings with Friends and Family: Once a week    Attends Religious Services: More than 4 times per year    Active Member of [employer] or Organizations: Yes    Attends Banker Meetings: More than 4 times per year    Marital Status: Divorced  Recent Concern: Social Connections - Socially Isolated (06/29/2024)   Social Connection and Isolation Panel    Frequency of Communication with Friends and Family: Three times a week    Frequency of Social Gatherings with Friends and Family: Once a week    Attends Religious Services: Never    Database Administrator or Organizations: No    Attends Banker Meetings: Never    Marital Status: Divorced  Depression (PHQ2-9): Low Risk (09/01/2024)   Depression (PHQ2-9)    PHQ-2  Score: 1  Recent Concern: Depression (PHQ2-9) - Medium Risk (07/27/2024)   Depression (PHQ2-9)    PHQ-2 Score: 9  Alcohol Screen: Low Risk (09/25/2023)   Alcohol Screen    Last Alcohol Screening Score (AUDIT): 0  Housing: Low Risk  (11/02/2024)   Received [facility]   Epic    In the last 12 months, was there a time when you were not able to pay the mortgage or rent on time?: No    In the past 12 months, how many times have you moved where you were living?: 0    At any time in the past 12 months, were you homeless or living in a shelter (including now)?: No  Recent Concern: Housing - High Risk (09/21/2024)   Received from [facility]   Epic    In the last 12 months, was there a time when you were not able to pay the mortgage or rent on time?: Yes    In the past 12 months, how many times have you moved where you were living?: 0    At any time in the past 12 months, were you homeless or living in a shelter (including now)?: No  Utilities: Not At Risk (11/02/2024)   Received from [facility]   Epic    In the past 12 months has the electric, gas, oil, or water company threatened to shut off services in your home?: No  Health Literacy: Adequate Health Literacy (06/29/2024)   B1300 Health Literacy    Frequency of need for help with medical instructions: Never    Allergies: Allergies[1]  Metabolic Disorder Labs: Lab Results  Component Value Date   HGBA1C 7.0 (H) 07/27/2024   MPG 111.15 09/11/2023   MPG 116.89 04/08/2023   No results found for: PROLACTIN Lab Results  Component Value Date   CHOL 251 (H) 07/27/2024   TRIG 174.0 (H) 07/27/2024   HDL 61.00 07/27/2024   CHOLHDL 4 07/27/2024   VLDL 34.8 07/27/2024   LDLCALC 155 (H) 07/27/2024   LDLCALC 128 (H) 09/12/2023   Lab Results  Component Value Date   TSH 3.79 02/24/2024   TSH 2.288 04/08/2023    Therapeutic Level Labs: No results found for: LITHIUM No results  found for: VALPROATE No results found for: CBMZ  Current Medications: Current Outpatient Medications  Medication Sig Dispense Refill   ramelteon  (ROZEREM ) 8 MG tablet Take 1 tablet (8 mg total) by mouth at bedtime. 30 tablet 0   busPIRone  (BUSPAR ) 10 MG tablet TAKE 1 TABLET BY MOUTH TWICE A DAY 180 tablet 1   celecoxib  (CELEBREX ) 200 MG capsule Take 200 mg by mouth 2 (two) times daily.     gabapentin  (NEURONTIN ) 600 MG tablet Take 1 tablet (600 mg total) by mouth at bedtime. Dose change 30 tablet 2   methocarbamol  (ROBAXIN ) 500 MG tablet Take 500 mg by mouth 4 (four) times daily.     ondansetron  (ZOFRAN -ODT) 4 MG disintegrating tablet Allow 1-2 tablets to dissolve in your mouth every 8 hours as needed for nausea/vomiting 30 tablet 0   pantoprazole  (PROTONIX ) 40 MG tablet TAKE 1 TABLET BY MOUTH EVERY DAY 90 tablet 1   promethazine  (PHENERGAN ) 12.5 MG tablet Take 1 tablet (12.5 mg total) by mouth every 6 (six) hours as needed for nausea or vomiting. 30 tablet 0   Semaglutide ,0.25 or 0.5MG /DOS, (OZEMPIC , 0.25 OR 0.5 MG/DOSE,) 2 MG/3ML SOPN Inject 0.5 mg into the skin once a week. 3 mL 2   venlafaxine  XR (EFFEXOR  XR) 150 MG 24 hr capsule Take 1 capsule (150 mg total) by mouth daily with breakfast. 90 capsule 0   No current facility-administered medications for this visit.     Musculoskeletal: Strength & Muscle Tone: UTA Gait & Station: Seated Patient leans:  N/A  Psychiatric Specialty Exam: Review of Systems  Psychiatric/Behavioral:  Positive for sleep disturbance. The patient is nervous/anxious.     There were no vitals taken for this visit.There is no height or weight on file to calculate BMI.  General Appearance: Casual  Eye Contact:  Fair  Speech:  Clear and Coherent  Volume:  Normal  Mood:  Anxious  Affect:  Congruent  Thought Process:  Goal Directed and Descriptions of Associations: Intact  Orientation:  Full (Time, Place, and Person)  Thought Content: Logical   Suicidal  Thoughts:  No  Homicidal Thoughts:  No  Memory:  Immediate;   Fair Recent;   Fair Remote;   Fair  Judgement:  Fair  Insight:  Fair  Psychomotor Activity:  Normal  Concentration:  Concentration: Fair and Attention Span: Fair  Recall:  [employer] of Knowledge: Fair  Language: Fair  Akathisia:  No  Handed:  Right  AIMS (if indicated): not done  Assets:  Communication Skills Desire for Improvement Housing Social Support  ADL's:  Intact  Cognition: WNL  Sleep:  poor   Screenings: AIMS    Flowsheet Row Office Visit from 04/27/2024 in [facility]  AIMS Total Score 0   AUDIT    Flowsheet Row [facility]  Alcohol Use Disorder Identification Test Final Score (AUDIT) 30   GAD-7    Flowsheet Row Office Visit from 09/01/2024 in [facility] Counselor from 06/29/2024 in [facility] Office Visit from 06/09/2024 in [facility] Office Visit from 04/27/2024 in [facility] Office Visit from 02/09/2024 in [facility]  Total GAD-7 Score 9 16 18 19 16    PHQ2-9    Flowsheet Row Office Visit from 09/01/2024 in [facility] Office Visit from 07/27/2024 in [facility] Visit from 07/08/2024 in [facility] Counselor from 06/29/2024 in [facility] Office Visit from 06/09/2024 in [facility]  PHQ-2 Total Score 1 2 4 6 4   PHQ-9 Total Score -- 9 13 20 15    Flowsheet Row Video Visit from 11/03/2024 in [facility] Video Visit from 09/29/2024 in [facility] Office Visit from 09/01/2024 in [facility]  C-SSRS RISK CATEGORY Moderate Risk Moderate Risk Moderate Risk     Assessment and Plan: Tracy Phillips is a 58 year old Caucasian female who presented for a follow-up appointment, discussed assessment and plan as noted below.  1. Recurrent major depressive disorder, in full remission Currently denies any significant depression symptoms Continue Venlafaxine  extended release 150 mg daily Continue BuSpar  10 mg twice a day  2. Generalized anxiety disorder-improving Currently reports anxiety as improved. Encouraged to continue CBT with Ms. Almarie Ligas Continue Venlafaxine  as prescribed Continue BuSpar  10 mg twice a day Continue Gabapentin  600 mg at bedtime  3. Insomnia, unspecified type-unstable Ongoing sleep problems likely multifactorial including anxiety, pain Start Ramelteon  8 mg daily at bedtime Continue sleep hygiene techniques  4. Alcohol use disorder, moderate, in early remission (HCC) Currently denies any significant alcohol use. Will monitor closely.  Follow-up Follow-up in clinic in 2 to 3 weeks or sooner if needed.  Collaboration of Care: Collaboration of Care: Referral or  follow-up with counselor/therapist AEB encouraged to continue psychotherapy sessions.  Patient/Guardian was advised Release of Information must be obtained prior to any record release in order to collaborate their care with an outside provider. Patient/Guardian was advised if they have not already done so to contact the registration department to sign all necessary forms in order for us  to release information regarding their care.   Consent: Patient/Guardian gives verbal consent for treatment and assignment of benefits for services provided during this visit. Patient/Guardian expressed understanding and agreed to proceed.  This note was generated in part or whole  with voice recognition software. Voice recognition is usually quite accurate but there are transcription errors that can and very often do occur. I apologize for any typographical errors that were not detected and corrected.     Tracy Canizares, MD 11/03/2024, 8:59 AM     [1]  Allergies Allergen Reactions   Aleve [Naproxen Sodium] Hives

## 2024-11-04 ENCOUNTER — Ambulatory Visit: Payer: Self-pay

## 2024-11-04 NOTE — Telephone Encounter (Signed)
 Lvm for pt to return call. Wanted to confirm if pt was seen by [facility]

## 2024-11-04 NOTE — Telephone Encounter (Signed)
 FYI Only or Action Required?: FYI only for provider: pt to go to urgent care.  Patient was last seen in primary care on 07/30/2024 by Glendia Shad, MD.  Called Nurse Triage reporting Flank Pain.  Symptoms began a week ago.  Interventions attempted: Prescription medications: celebrex  and tramadol .  Symptoms are: gradually worsening.  Triage Disposition: See Physician Within 24 Hours  Patient/caregiver understands and will follow disposition?: Yes    Copied from CRM #[id number]. Topic: Clinical - Red Word Triage >> Nov 04, 2024  2:24 PM Sasha M wrote: Red Word that prompted transfer to Nurse Triage: Pt has UTI symptoms and kidney pain. Reason for Disposition  MODERATE pain (e.g., interferes with normal activities or awakens from sleep)  Answer Assessment - Initial Assessment Questions 1. LOCATION: Where does it hurt? (e.g., left, right)     R flank pain x 1 week  2. ONSET: When did the pain start?     1 week  3. SEVERITY: How bad is the pain? (e.g., Scale 1-10; mild, moderate, or severe)     6/10, takes celebrex  and tramadol  normally but reports it is not helping  4. PATTERN: Does the pain come and go, or is it constant?      Constant  5. CAUSE: What do you think is causing the pain?     Pt believes she has a kidney infection, states she has had several in the past  6. OTHER SYMPTOMS:  Do you have any other symptoms? (e.g., fever, abdomen pain, vomiting, leg weakness, burning with urination, blood in urine)     Dark urine    Taking acyclovir  for herpes for years  Protocols used: Flank Pain-A-AH

## 2024-11-05 ENCOUNTER — Ambulatory Visit: Payer: Self-pay

## 2024-11-05 NOTE — Telephone Encounter (Signed)
 Attempt # 1 to reach patient to triage symptoms. Left VM to call back     Copied from CRM #[id number]. Topic: Clinical - Red Word Triage >> Nov 05, 2024  1:31 PM Drema MATSU wrote: Red Word that prompted transfer to Nurse Triage: Patient is still in pain from Kidneys. She stated that the pain is starting to come around to her stomach. She was not able to go to Urgent Care on yesterday. >> Nov 05, 2024  1:46 PM Drema MATSU wrote: Pt is at work and could not hold. Please call pt back.

## 2024-11-05 NOTE — Telephone Encounter (Signed)
 Patient called, left VM to return the call to the office to speak to NT.  Unable to reach patient after 2 attempts by E2C2 NT, routing to the provider for resolution. Tracy Phillips

## 2024-11-06 ENCOUNTER — Encounter: Payer: Self-pay | Admitting: Internal Medicine

## 2024-11-06 DIAGNOSIS — E1165 Type 2 diabetes mellitus with hyperglycemia: Secondary | ICD-10-CM

## 2024-11-08 NOTE — Telephone Encounter (Signed)
Left message for Patient to call the office back.

## 2024-11-08 NOTE — Assessment & Plan Note (Signed)
 Recent A1c 7.0. on ozempic .

## 2024-11-08 NOTE — Telephone Encounter (Signed)
 Spoke with pt, pt in agreement with bring bill if she is charged

## 2024-11-08 NOTE — Telephone Encounter (Signed)
 Spoke to pt. Pt stated she is still having pain. Agreed to be seen at [facility] after work. Advised pt she needed to be seen within the next 24hrs to ensure there is no infection. Pt verbalized understanding. Will follow up with pt tomorrow or Wednesday.

## 2024-11-08 NOTE — Telephone Encounter (Signed)
 Did she check with her insurance to see if there is another preferred medication. Also, can check with pharmacy Maryjane) or authorization team - to see what would be her best option.

## 2024-11-08 NOTE — Telephone Encounter (Signed)
 In reviewing her chart, she called in 11/04/24 and 11/05/24 - regarding flank pain. Per last note, pain was worsening and moving around to abdomen. Please call and f/u to see how she is doing. If persistent pain, needs to be seen - UC/ACC, etc.

## 2024-11-08 NOTE — Telephone Encounter (Signed)
 Agree if persistent pain, needs to be evaluated.

## 2024-11-08 NOTE — Telephone Encounter (Signed)
 Spoke to pt. Pt would like to see if pharmacy team can help with cost or a different meds. Pt stated it is to expensive cost is at about $700

## 2024-11-16 ENCOUNTER — Ambulatory Visit: Admitting: Professional Counselor

## 2024-11-23 ENCOUNTER — Telehealth: Payer: Self-pay

## 2024-11-23 NOTE — Progress Notes (Signed)
 Complex Care Management Note Care Guide Note  11/23/2024 Name: Tracy Phillips MRN: [id number] DOB: May 10, 1966   Complex Care Management Outreach Attempts: An unsuccessful telephone outreach was attempted today to offer the patient information about available complex care management services.  Follow Up Plan:  Additional outreach attempts will be made to offer the patient complex care management information and services.   Encounter Outcome:  No Answer  Dreama [facility]  [facility], [facility] VBCI Assistant Direct Dial: [phone]  Fax: [phone]

## 2024-11-23 NOTE — Progress Notes (Signed)
 Complex Care Management Note  Care Guide Note 11/23/2024 Name: RAYHANA SLIDER MRN: [id number] DOB: 1966-04-08  Luke LITTIE Drown is a 59 y.o. year old female who sees Glendia Shad, MD for primary care. I reached out to Luke LITTIE Drown by phone today to offer complex care management services.  Ms. Cavey was given information about Complex Care Management services today including:   The Complex Care Management services include support from the care team which includes your Nurse Care Manager, Clinical Social Worker, or Pharmacist.  The Complex Care Management team is here to help remove barriers to the health concerns and goals most important to you. Complex Care Management services are voluntary, and the patient may decline or stop services at any time by request to their care team member.   Complex Care Management Consent Status: Patient agreed to services and verbal consent obtained.   Follow up plan:  Telephone appointment with complex care management team member scheduled for:  12/06/24 at 1:00 p.m.   Encounter Outcome:  Patient Scheduled  Dreama [facility]  [facility], [facility] VBCI Assistant Direct Dial: [phone]  Fax: [phone]

## 2024-11-25 ENCOUNTER — Other Ambulatory Visit: Payer: Self-pay | Admitting: Psychiatry

## 2024-11-25 ENCOUNTER — Ambulatory Visit: Admitting: Professional Counselor

## 2024-11-25 DIAGNOSIS — G4700 Insomnia, unspecified: Secondary | ICD-10-CM

## 2024-11-25 DIAGNOSIS — F411 Generalized anxiety disorder: Secondary | ICD-10-CM | POA: Diagnosis not present

## 2024-11-25 DIAGNOSIS — F3341 Major depressive disorder, recurrent, in partial remission: Secondary | ICD-10-CM

## 2024-11-25 DIAGNOSIS — F331 Major depressive disorder, recurrent, moderate: Secondary | ICD-10-CM

## 2024-11-25 NOTE — Progress Notes (Unsigned)
" °  THERAPIST PROGRESS NOTE  Session Time: 8:03 AM - 8:53 AM   Participation Level: Active  Behavioral Response: Well Groomed, Alert, Anxious and Dysphoric  Type of Therapy: Individual Therapy  Treatment Goals addressed: Active Anxiety  LTG: There is a root cause that I'm unaware of, that still makes me feel like everybody else in the world has the key, to happiness, to joy, and I feel like I'm always searching for that. So I would love to be able to work through what that is.                Start:  10/12/24    Expected End:  10/11/25      STG: Any kind of suggestions for me to navigate and stay in the 24 hours that I'm in. To improve mindfulness AEB utilizing mindfulness skills 3 out of 7 days a week for the next 12 weeks.     STG: Being God conscious. Tapping into my faith more rather than self-reliance. I can over think the crap out of things. To improve mindset AEB restructuring maladaptive patterns of thinking over the next 12 weeks.     STG: To get out and enjoy the things that I used to. To improve socialization AEB engaging in recreation activities twice a month for the next three months.  ProgressTowards Goals: Progressing  Interventions: CBT and Supportive  Summary: HENLEY BLYTH is a 59 y.o. female who presents with a history of anxiety and depression. She appeared alert and oriented x5. She stated she had the flu all through Christmas and New Years. She noted she initially.   Therapist Response: Conducted session with . Began session with check-in/update since previous session. Utilized empathetic and reflective listening. Used open-ended questions to facilitate discussion and summarized thoughts/feelings. Scheduled additional appointment and concluded session.   Suicidal/Homicidal: No  Plan: Return again in *** weeks.  Diagnosis: Recurrent major depressive disorder, in partial remission  Generalized anxiety disorder  Collaboration of Care: Medication  Management AEB chart review  Patient/Guardian was advised Release of Information must be obtained prior to any record release in order to collaborate their care with an outside provider. Patient/Guardian was advised if they have not already done so to contact the registration department to sign all necessary forms in order for us  to release information regarding their care.   Consent: Patient/Guardian gives verbal consent for treatment and assignment of benefits for services provided during this visit. Patient/Guardian expressed understanding and agreed to proceed.   Almarie JONETTA Ligas, [facility] 11/25/2024  "

## 2024-11-29 ENCOUNTER — Encounter: Payer: Self-pay | Admitting: Psychiatry

## 2024-11-29 ENCOUNTER — Telehealth: Admitting: Psychiatry

## 2024-11-29 DIAGNOSIS — F1021 Alcohol dependence, in remission: Secondary | ICD-10-CM

## 2024-11-29 DIAGNOSIS — F3342 Major depressive disorder, recurrent, in full remission: Secondary | ICD-10-CM

## 2024-11-29 DIAGNOSIS — F411 Generalized anxiety disorder: Secondary | ICD-10-CM | POA: Diagnosis not present

## 2024-11-29 DIAGNOSIS — G4700 Insomnia, unspecified: Secondary | ICD-10-CM | POA: Diagnosis not present

## 2024-11-29 MED ORDER — GABAPENTIN 600 MG PO TABS: 300.0000 mg | ORAL_TABLET | Freq: Every day | ORAL | Status: AC

## 2024-11-29 MED ORDER — DOXEPIN HCL 10 MG PO CAPS: 10.0000 mg | ORAL_CAPSULE | Freq: Every day | ORAL | 0 refills | Status: DC

## 2024-11-29 NOTE — Patient Instructions (Signed)
Doxepin Capsules What is this medication? DOXEPIN (DOX e pin) treats depression and anxiety. It increases the amount of serotonin and norepinephrine in the brain, hormones that help regulate mood. It belongs to a group of medications called tricyclic antidepressants (TCAs). This medicine may be used for other purposes; ask your health care provider or pharmacist if you have questions. COMMON BRAND NAME(S): Sinequan What should I tell my care team before I take this medication? They need to know if you have any of these conditions: Bipolar disorder Difficulty passing urine Frequently drink alcohol Glaucoma Heart disease Liver disease Lung or breathing disease, such as asthma or sleep apnea Prostate trouble Schizophrenia Seizures Suicidal thoughts, plans, or attempt by you or a family member An unusual or allergic reaction to doxepin, other medications, foods, dyes, or preservatives Pregnant or trying to get pregnant Breastfeeding How should I use this medication? Take this medication by mouth with a glass of water. Follow the directions on the prescription label. Take your doses at regular intervals. Do not take your medication more often than directed. Do not stop taking this medication suddenly except upon the advice of your care team. Stopping this medication too quickly may cause serious side effects or your condition may worsen. A special MedGuide will be given to you by the pharmacist with each prescription and refill. Be sure to read this information carefully each time. Talk to your care team about the use of this medication in children. While this medication may be prescribed for children as young as 12 years for selected conditions, precautions do apply. Overdosage: If you think you have taken too much of this medicine contact a poison control center or emergency room at once. NOTE: This medicine is only for you. Do not share this medicine with others. What if I miss a dose? If  you miss a dose, take it as soon as you can. If it is almost time for your next dose, take only that dose. Do not take double or extra doses. What may interact with this medication? Do not take this medication with any of the following: Arsenic trioxide Certain medications for irregular heartbeat or other heart conditions Cisapride Halofantrine Levomethadyl Linezolid MAOIs, such as Carbex, Eldepryl, Marplan, Nardil, and Parnate Methylene blue Other medications for depression Phenothiazines, such as perphenazine, thioridazine, and chlorpromazine Pimozide Procarbazine Sparfloxacin St. John's Wort This medication may also interact with the following: Cimetidine Tolazamide Ziprasidone This list may not describe all possible interactions. Give your health care provider a list of all the medicines, herbs, non-prescription drugs, or dietary supplements you use. Also tell them if you smoke, drink alcohol, or use illegal drugs. Some items may interact with your medicine. What should I watch for while using this medication? Visit your care team for regular checks on your progress. It can take several days before you feel the full effect of this medication. If you have been taking this medication regularly for some time, do not suddenly stop taking it. You must gradually reduce the dose, or you may get severe side effects. Ask your care team for advice. Even after you stop taking this medication it can still affect your body for several days. Patients and their families should watch out for new or worsening thoughts of suicide or depression. Also watch out for sudden changes in feelings such as feeling anxious, agitated, panicky, irritable, hostile, aggressive, impulsive, severely restless, overly excited and hyperactive, or not being able to sleep. If this happens, especially at the beginning of  treatment or after a change in dose, call your care team. This medication may affect your coordination,  reaction time, or judgment. Do not drive or operate machinery until you know how this medication affects you. Sit up or stand slowly to reduce the risk of dizzy or fainting spells. Drinking alcohol with this medication can increase the risk of these side effects. Do not treat yourself for coughs, colds, or allergies without asking your care team for advice. Some ingredients can increase possible side effects. Your mouth may get dry. Chewing sugarless gum or sucking hard candy and drinking plenty of water may help. Contact your care team if the problem does not go away or is severe. This medication may cause dry eyes and blurred vision. If you wear contact lenses you, may feel some discomfort. Lubricating eye drops may help. See your care team if the problem does not go away or is severe. This medication can make you more sensitive to the sun. Keep out of the sun. If you cannot avoid being in the sun, wear protective clothing and sunscreen. Do not use sun lamps, tanning beds, or tanning booths. What side effects may I notice from receiving this medication? Side effects that you should report to your care team as soon as possible: Allergic reactions--skin rash, itching, hives, swelling of the face, lips, tongue, or throat Irritability, confusion, fast or irregular heartbeat, muscle stiffness, twitching muscles, sweating, high fever, seizure, chills, vomiting, diarrhea, which may be signs of serotonin syndrome Sudden eye pain or change in vision such as blurry vision, seeing halos around lights, vision loss Thoughts of suicide or self-harm, worsening mood, or feelings of depression Trouble passing urine Side effects that usually do not require medical attention (report to your care team if they continue or are bothersome): Change in sex drive or performance Constipation Dizziness Drowsiness Dry mouth Tremors Weight gain This list may not describe all possible side effects. Call your doctor for  medical advice about side effects. You may report side effects to FDA at 1-800-FDA-1088. Where should I keep my medication? Keep out of the reach of children. Store at room temperature between 15 and 30 degrees C (59 and 86 degrees F). Throw away any unused medication after the expiration date. NOTE: This sheet is a summary. It may not cover all possible information. If you have questions about this medicine, talk to your doctor, pharmacist, or health care provider.  2024 Elsevier/Gold Standard (2022-06-06 00:00:00)

## 2024-11-29 NOTE — Progress Notes (Signed)
 Virtual Visit via Video Note  I connected with Tracy Phillips on 11/29/2024 at  1:00 PM EST by a video enabled telemedicine application and verified that I am speaking with the correct person using two identifiers.  Location Provider Location : [facility] Patient Location : Work  Participants: Patient , Provider    I discussed the limitations of evaluation and management by telemedicine and the availability of in person appointments. The patient expressed understanding and agreed to proceed.   I discussed the assessment and treatment plan with the patient. The patient was provided an opportunity to ask questions and all were answered. The patient agreed with the plan and demonstrated an understanding of the instructions.   The patient was advised to call back or seek an in-person evaluation if the symptoms worsen or if the condition fails to improve as anticipated.   BH MD OP Progress Note  11/29/2024 1:30 PM Tracy Phillips  MRN:  [id number]  Chief Complaint:  Chief Complaint  Patient presents with   Follow-up   Anxiety   Depression   Medication Refill   Discussed the use of AI scribe software for clinical note transcription with the patient, who gave verbal consent to proceed.  History of Present Illness Tracy Phillips is a 59 year old Caucasian female, divorced, currently lives in [place], has a history of MDD, GAD, renal stones, hyperglycemia, hypercholesterolemia, hip pain, gastroesophageal reflux disease, history of significant use of alcohol with alcohol withdrawal symptoms in the past was evaluated by telemedicine today.  She continues to have ongoing difficulty with sleep, as she describes disrupted sleep primarily affected by pain and persistent anxiety. Racing thoughts and anxiety at bedtime interfere with her ability to fall asleep. She previously tried Ramelteon  for approximately 3 weeks but discontinued it due to experiencing severe, distressing dreams and minimal  improvement in sleep onset.   She continues to experience anxiety, particularly at night, which remains a major barrier to sleep. She currently takes gabapentin  at bedtime for anxiety, which she reports is a high dose. She expresses concerns about side effects from gabapentin , specifically water retention and increased hunger, and expresses interest in weaning off this medication. She also takes venlafaxine  (Effexor ) 150 mg for anxiety and mood, and continues with therapy, working with her therapist on behavioral strategies to address anxiety at bedtime.  She denies any thoughts about hurting herself or others.  She denies any perceptual disturbances.  She denies alcohol use during the holidays.      Visit Diagnosis:    ICD-10-CM   1. Recurrent major depressive disorder, in full remission  F33.42     2. Generalized anxiety disorder  F41.1 Tracy Phillips  (SINEQUAN ) 10 MG capsule    gabapentin  (NEURONTIN ) 600 MG tablet    3. Insomnia, unspecified type  G47.00 Tracy Phillips  (SINEQUAN ) 10 MG capsule    4. Alcohol use disorder, moderate, in early remission (HCC)  F10.21    13 years of sobriety with recent relapse in February 2025 currently in early remission      Past Psychiatric History: I have reviewed past psychiatric history from progress note on 02/09/2024.  I have reviewed my progress note on 04/27/2024.  Past Medical History:  Past Medical History:  Diagnosis Date   Alcohol abuse    Anemia    Anxiety    a.) on BZO (lorazepam ) PRN   Crack cocaine use    Demand ischemia (HCC)    a. 08/2023 HsTrop 107 in setting of crack cocaine; b. 08/2023 Echo: EF  of 60-65%, without regional wall motion abnormalities, normal RV size and function, mild MR, and mild-moderate TR.   Depression    Family history of adverse reaction to anesthesia    a.) PONV in 1st degree relative (mother)   GERD (gastroesophageal reflux disease)    Gestational diabetes    H/O dizziness    H/O eating disorder    Herpes     History of chicken pox    History of colon polyps    History of kidney stones    Hyperlipidemia    Vitamin B12 deficiency     Past Surgical History:  Procedure Laterality Date   ABDOMINAL HYSTERECTOMY  11/18/1996   APPENDECTOMY  11/19/1995   COLONOSCOPY     DILATION AND CURETTAGE OF UTERUS     multiple   TONSILLECTOMY  11/19/1995   TOTAL HIP ARTHROPLASTY Left 03/18/2024   Procedure: ARTHROPLASTY, HIP, TOTAL, ANTERIOR APPROACH;  Surgeon: Lorelle Hussar, MD;  Location: [facility];  Service: Orthopedics;  Laterality: Left;   WRIST FRACTURE SURGERY Left     Family Psychiatric History: I have reviewed family psychiatric history from progress note on 02/09/2024.  Family History:  Family History  Problem Relation Age of Onset   Depression Mother    Hyperlipidemia Mother    Hypertension Mother    Breast cancer Mother 70   Dementia Father    Arthritis Father    Hyperlipidemia Father    Hypertension Father    Diabetes Father    Depression Brother    Hypertension Brother    Depression Brother    Depression Maternal Aunt    Arthritis Maternal Aunt    Hyperlipidemia Maternal Aunt    Hypertension Maternal Aunt    Arthritis Paternal Aunt    Hyperlipidemia Paternal Aunt    Hypertension Paternal Aunt    Alcohol abuse Maternal Uncle    Arthritis Maternal Uncle    Hyperlipidemia Maternal Uncle    Hypertension Maternal Uncle    Arthritis Paternal Uncle    Hyperlipidemia Paternal Uncle    Hypertension Paternal Uncle    Arthritis Maternal Grandfather    Lung cancer Maternal Grandfather    Prostate cancer Maternal Grandfather    Hyperlipidemia Maternal Grandfather    Stroke Maternal Grandfather    Hypertension Maternal Grandfather    Arthritis Maternal Grandmother    Hyperlipidemia Maternal Grandmother    Hypertension Maternal Grandmother    Alcohol abuse Paternal Grandfather    Arthritis Paternal Grandfather    Hyperlipidemia Paternal Grandfather    Hypertension Paternal  Grandfather    Sudden death Paternal Grandfather    Arthritis Paternal Grandmother    Hyperlipidemia Paternal Grandmother    Hypertension Paternal Grandmother     Social History: I have reviewed social history from progress note on 02/09/2024. Social History   Socioeconomic History   Marital status: Divorced    Spouse name: Not on file   Number of children: 3   Years of education: Not on file   Highest education level: Bachelor's degree (e.g., BA, AB, BS)  Occupational History   Not on file  Tobacco Use   Smoking status: Never   Smokeless tobacco: Never  Vaping Use   Vaping status: Never Used  Substance and Sexual Activity   Alcohol use: No   Drug use: Not Currently    Types: Crack cocaine   Sexual activity: Not Currently  Other Topics Concern   Not on file  Social History Narrative   Not on file   Social Drivers  of Health   Tobacco Use: Low Risk (11/29/2024)   Patient History    Smoking Tobacco Use: Never    Smokeless Tobacco Use: Never    Passive Exposure: Not on file  Financial Resource Strain: Medium Risk (11/02/2024)   Received from [facility]   Overall Financial Resource Strain (CARDIA)    Difficulty of Paying Living Expenses: Somewhat hard  Food Insecurity: Food Insecurity Present (11/02/2024)   Received from [facility]   Epic    Within the past 12 months, you worried that your food would run out before you got the money to buy more.: Sometimes true    Within the past 12 months, the food you bought just didn't last and you didn't have money to get more.: Sometimes true  Transportation Needs: No Transportation Needs (11/02/2024)   Received from [facility] - Transportation    In the past 12 months, has lack of transportation kept you from medical appointments or from getting medications?: No    Lack of Transportation (Non-Medical): No  Physical Activity: Sufficiently Active (07/27/2024)    Exercise Vital Sign    Days of Exercise per Week: 5 days    Minutes of Exercise per Session: 90 min  Recent Concern: Physical Activity - Inactive (06/29/2024)   Exercise Vital Sign    Days of Exercise per Week: 0 days    Minutes of Exercise per Session: 0 min  Stress: Stress Concern Present (07/27/2024)   [employer] of Occupational Health - Occupational Stress Questionnaire    Feeling of Stress: To some extent  Social Connections: Moderately Integrated (07/27/2024)   Social Connection and Isolation Panel    Frequency of Communication with Friends and Family: More than three times a week    Frequency of Social Gatherings with Friends and Family: Once a week    Attends Religious Services: More than 4 times per year    Active Member of [employer] or Organizations: Yes    Attends Banker Meetings: More than 4 times per year    Marital Status: Divorced  Recent Concern: Social Connections - Socially Isolated (06/29/2024)   Social Connection and Isolation Panel    Frequency of Communication with Friends and Family: Three times a week    Frequency of Social Gatherings with Friends and Family: Once a week    Attends Religious Services: Never    Database Administrator or Organizations: No    Attends Banker Meetings: Never    Marital Status: Divorced  Depression (PHQ2-9): Low Risk (09/01/2024)   Depression (PHQ2-9)    PHQ-2 Score: 1  Recent Concern: Depression (PHQ2-9) - Medium Risk (07/27/2024)   Depression (PHQ2-9)    PHQ-2 Score: 9  Alcohol Screen: Low Risk (09/25/2023)   Alcohol Screen    Last Alcohol Screening Score (AUDIT): 0  Housing: Low Risk  (11/02/2024)   Received from [facility]   Epic    In the last 12 months, was there a time when you were not able to pay the mortgage or rent on time?: No    In the past 12 months, how many times have you moved where you were living?: 0    At any time in the past 12 months, were you homeless or living  in a shelter (including now)?: No  Recent Concern: Housing - High Risk (09/21/2024)   Received from [facility]   Epic    In  the last 12 months, was there a time when you were not able to pay the mortgage or rent on time?: Yes    In the past 12 months, how many times have you moved where you were living?: 0    At any time in the past 12 months, were you homeless or living in a shelter (including now)?: No  Utilities: Not At Risk (11/02/2024)   Received from [facility]   Epic    In the past 12 months has the electric, gas, oil, or water company threatened to shut off services in your home?: No  Health Literacy: Adequate Health Literacy (06/29/2024)   B1300 Health Literacy    Frequency of need for help with medical instructions: Never    Allergies: Allergies[1]  Metabolic Disorder Labs: Lab Results  Component Value Date   HGBA1C 7.0 (H) 07/27/2024   MPG 111.15 09/11/2023   MPG 116.89 04/08/2023   No results found for: PROLACTIN Lab Results  Component Value Date   CHOL 251 (H) 07/27/2024   TRIG 174.0 (H) 07/27/2024   HDL 61.00 07/27/2024   CHOLHDL 4 07/27/2024   VLDL 34.8 07/27/2024   LDLCALC 155 (H) 07/27/2024   LDLCALC 128 (H) 09/12/2023   Lab Results  Component Value Date   TSH 3.79 02/24/2024   TSH 2.288 04/08/2023    Therapeutic Level Labs: No results found for: LITHIUM No results found for: VALPROATE No results found for: CBMZ  Current Medications: Current Outpatient Medications  Medication Sig Dispense Refill   Tracy Phillips  (SINEQUAN ) 10 MG capsule Take 1 capsule (10 mg total) by mouth at bedtime. Stop Ramelteon  30 capsule 0   busPIRone  (BUSPAR ) 10 MG tablet TAKE 1 TABLET BY MOUTH TWICE A DAY 180 tablet 1   celecoxib  (CELEBREX ) 200 MG capsule Take 200 mg by mouth 2 (two) times daily.     gabapentin  (NEURONTIN ) 600 MG tablet Take 0.5 tablets (300 mg total) by mouth at bedtime. Dose change     methocarbamol  (ROBAXIN ) 500 MG  tablet Take 500 mg by mouth 4 (four) times daily.     ondansetron  (ZOFRAN -ODT) 4 MG disintegrating tablet Allow 1-2 tablets to dissolve in your mouth every 8 hours as needed for nausea/vomiting 30 tablet 0   pantoprazole  (PROTONIX ) 40 MG tablet TAKE 1 TABLET BY MOUTH EVERY DAY 90 tablet 1   promethazine  (PHENERGAN ) 12.5 MG tablet Take 1 tablet (12.5 mg total) by mouth every 6 (six) hours as needed for nausea or vomiting. 30 tablet 0   Semaglutide ,0.25 or 0.5MG /DOS, (OZEMPIC , 0.25 OR 0.5 MG/DOSE,) 2 MG/3ML SOPN Inject 0.5 mg into the skin once a week. 3 mL 2   venlafaxine  XR (EFFEXOR  XR) 150 MG 24 hr capsule Take 1 capsule (150 mg total) by mouth daily with breakfast. 90 capsule 0   No current facility-administered medications for this visit.     Musculoskeletal: Strength & Muscle Tone: UTA Gait & Station: Seated Patient leans: N/A  Psychiatric Specialty Exam: Review of Systems  Psychiatric/Behavioral:  Positive for sleep disturbance. The patient is nervous/anxious.     There were no vitals taken for this visit.There is no height or weight on file to calculate BMI.  General Appearance: Casual  Eye Contact:  Fair  Speech:  Clear and Coherent  Volume:  Normal  Mood:  Anxious  Affect:  Appropriate  Thought Process:  Goal Directed and Descriptions of Associations: Intact  Orientation:  Full (Time, Place, and Person)  Thought Content: Logical   Suicidal  Thoughts:  No  Homicidal Thoughts:  No  Memory:  Immediate;   Fair Recent;   Fair Remote;   Fair  Judgement:  Fair  Insight:  Fair  Psychomotor Activity:  Normal  Concentration:  Concentration: Fair and Attention Span: Fair  Recall:  [employer] of Knowledge: Fair  Language: Fair  Akathisia:  No  Handed:  Right  AIMS (if indicated): not done  Assets:  Communication Skills Desire for Improvement Housing Social Support Transportation  ADL's:  Intact  Cognition: WNL  Sleep:  Poor   Screenings: AIMS    Flowsheet Row  [facility]/27/2024 in [facility]  AIMS Total Score 0   AUDIT    Flowsheet Row [facility]  Alcohol Use Disorder Identification Test Final Score (AUDIT) 30   GAD-7    Flowsheet Row Office Visit from 09/01/2024 in [facility] Counselor from 06/29/2024 in [facility] Office Visit from 06/09/2024 in [facility] Office Visit from 04/27/2024 in [facility] Office Visit from 02/09/2024 in [facility]  Total GAD-7 Score 9 16 18 19 16    PHQ2-9    Flowsheet Row Office Visit from 09/01/2024 in [facility] Office Visit from 07/27/2024 in [facility] Visit from 07/08/2024 in [facility] Counselor from 06/29/2024 in [facility] Office Visit from 06/09/2024 in [facility]  PHQ-2 Total Score 1 2 4 6 4   PHQ-9 Total Score -- 9 13 20 15    Flowsheet Row Video Visit from 11/29/2024 in [facility] Video Visit from 11/03/2024 in [facility] Video Visit from 09/29/2024 in [facility]  C-SSRS RISK CATEGORY Moderate Risk Moderate Risk Moderate Risk     Assessment and Plan: BERNARDETTE WALDRON is a 59 year old Caucasian female who presented for a follow-up appointment, discussed assessment and plan as noted below.  1. Recurrent major depressive disorder, in full remission Currently denies any significant depression symptoms Continue Venlafaxine  extended release 150 mg  daily Continue BuSpar  10 mg twice a day  2. Generalized anxiety disorder-unstable On going anxiety especially at bedtime describes as racing thoughts. Encouraged to continue CBT with Ms. Almarie Ligas Continue Venlafaxine  as prescribed.  Will consider dosage increase in the future Continue BuSpar  as prescribed Reduce Gabapentin  to 300 mg at bedtime due to side effects with plan to taper off in the future   3. Insomnia, unspecified type-unstable Ongoing sleep problems with side effects from the ramelteon  which was recently prescribed. Discontinue Ramelteon  for side effects Start Tracy Phillips  10 mg at bedtime Encouraged to work on relaxation/coping strategies for management of anxiety Will need sufficient pain management at bedtime as well.  4. Alcohol use disorder, moderate, in early remission (HCC) Currently denies any significant alcohol use   Follow-up Follow-up in clinic in 2 to 3 weeks or sooner if needed.    Collaboration of Care: Collaboration of Care: Referral or follow-up with counselor/therapist AEB encouraged to continue CBT I have coordinated care with Ms. Rhae  Patient/Guardian was advised Release of Information must be obtained prior to any record release in order to collaborate their care with an outside provider. Patient/Guardian was advised if they  have not already done so to contact the registration department to sign all necessary forms in order for us  to release information regarding their care.   Consent: Patient/Guardian gives verbal consent for treatment and assignment of benefits for services provided during this visit. Patient/Guardian expressed understanding and agreed to proceed.  This note was generated in part or whole with voice recognition software. Voice recognition is usually quite accurate but there are transcription errors that can and very often do occur. I apologize for any typographical errors that were not detected and corrected.     Izac Faulkenberry, MD 11/29/2024, 1:30 PM     [1]  Allergies Allergen Reactions   Aleve [Naproxen Sodium] Hives

## 2024-12-06 ENCOUNTER — Other Ambulatory Visit: Admitting: Pharmacist

## 2024-12-06 DIAGNOSIS — E1165 Type 2 diabetes mellitus with hyperglycemia: Secondary | ICD-10-CM

## 2024-12-06 NOTE — Progress Notes (Unsigned)
 "   12/07/2024 Name: Tracy Phillips MRN: [id number] DOB: June 15, 1966  Subjective  Chief Complaint  Patient presents with   Diabetes    Reason for visit: ?  Tracy Phillips is a 59 y.o. female with a history of diabetes (type 2), who presents today for an initial visit to discuss Ozempic  cost/possible alternative options. They were referred by their PCP for review of affordable options/Ozempic  unaffordable. Pertinent PMH also includes T2DM, HLD, hx kidney stones, Hx alcohol/cocaine use, hx muscle pain w CK elevation.  Known DM Complications: none   Care Team: Primary Care Provider: Glendia Shad, MD  Medication Access/Adherence: Prescription drug coverage: Payor: BLUE CROSS BLUE SHIELD / Plan: BCBS COMM PPO / Product Type: *No Product type* / .  ?  Reports that all medications are not affordable.  Reports ~$700 copay for Ozempic  at her pharmacy which she was expecting given high deductible insurance plan. Deductible = $2700 After deductible, copay $0   Patient notes that she will likely meet her deductible at some point in 2026 given her regular specialist visits, though this up front cost is not feasible for her at this time. She reports preference to discuss generic diabetes medications.    Reported DM Regimen: ?  Ozempic  0.5 mg/wk (last dose 2-3 weeks ago)   DM medications tried in the past:?  N/A  Denies baseline concerns with GI system. Generally tends to lean on side of constipation.  DM Prevention:  Statin: Hx CK elevation/muscle pain at baseline. Follows with pain mgmt. ?  History of chronic kidney disease? no History of albuminuria? no, last UACR on 07/30/24 = 5.5 mg/g ACE/ARB - Not prescribed. BP WNL; Urine MA/CR Ratio - <30 mg/g.  Last eye exam: Not documented; No hx retinopathy documented Last foot exam: 07/30/2024 Tobacco Use: Never smoker     _______________________________________________  Objective    Review of Systems:? Limited in the setting of  virtual visit  Constitutional:? No fever, chills or unintentional weight loss  GI:? No nausea, vomiting, constipation, diarrhea, abdominal pain, dyspepsia, change in bowel habits  Endocrine:? No polyuria, polyphagia or blurred vision    Physical Examination:  Vitals:  Wt Readings from Last 3 Encounters:  12/07/24 180 lb (81.6 kg)  09/01/24 183 lb (83 kg)  07/30/24 186 lb (84.4 kg)   BP Readings from Last 3 Encounters:  12/07/24 (!) 155/91  09/01/24 128/88  07/30/24 120/70   Pulse Readings from Last 3 Encounters:  12/07/24 94  09/01/24 83  07/30/24 84     Labs:?  Lab Results  Component Value Date   HGBA1C 7.0 (H) 07/27/2024   HGBA1C 5.9 02/24/2024   HGBA1C 5.5 09/11/2023   GLUCOSE 85 07/27/2024   MICRALBCREAT 5.5 07/30/2024   CREATININE 0.77 07/27/2024   CREATININE 0.61 06/06/2024   CREATININE 0.83 03/19/2024   GFR 86.58 07/27/2024   GFR 76.05 02/24/2024   GFR 98.63 10/23/2023   Lab Results  Component Value Date   CHOL 251 (H) 07/27/2024   LDLCALC 155 (H) 07/27/2024   LDLCALC 128 (H) 09/12/2023   LDLCALC 137 (H) 04/08/2023   LDLDIRECT 132.0 05/13/2019   LDLDIRECT 191.0 09/11/2017   HDL 61.00 07/27/2024   TRIG 174.0 (H) 07/27/2024   TRIG 153 (H) 09/12/2023   TRIG 149 04/08/2023   ALT 16 07/27/2024   ALT 16 02/12/2024   AST 17 07/27/2024   AST 15 02/12/2024     Chemistry      Component Value Date/Time   NA 141 07/27/2024  1144   K 4.9 07/27/2024 1144   CL 105 07/27/2024 1144   CO2 27 07/27/2024 1144   BUN 17 07/27/2024 1144   CREATININE 0.77 07/27/2024 1144      Component Value Date/Time   CALCIUM  9.4 07/27/2024 1144   ALKPHOS 102 07/27/2024 1144   AST 17 07/27/2024 1144   ALT 16 07/27/2024 1144   BILITOT 0.4 07/27/2024 1144   BILITOT 0.2 02/12/2024 1319     ASCVD 10-Year risk Score = 4.8% (12/06/24)  Assessment and Plan:   1. Diabetes, type 2: slightly uncontrolled per last A1c of 7.0% (07/27/24), increased from previous, 5.9% after which point  Ozempic  was started. On Ozempic  until early January, highest dose 0.5 mg/wk. Goal <7% without hypoglycemia. With high deductible plan, all brand medication will be high cost at start of the year which patient was aware of. She notes strong preference to discuss generic medication options.  With current A1c 7.0% (which was on no medication), we discussed option of repeat A1c/ongoing lifestyle intervention and no medication vs. starting low-dose of generic med with repeat A1c ~1 month (noting that some of the Ozempic  effects may still be reflected in A1c). Patient preference to start   Current Regimen: None (Ozempic  0.5 mg stopped 2-3 weeks ago) Diet: Patient notes dietary improvements since last A1c increase in Sept Recommendation sent to PCP: Consider metformin  XR 500mg , 1 tab daily x1 week. May increase to 2 tablets once daily after first week if tolerating well.   Hx Vitamin B12 deficiency, last checked WNL 2024. CBC normal 05/2024 Hgb 14.7, MCV 83.7.  Renal function normal at present with SCR 0.77, GFR 86.6. Baseline GFR mid-80s, baseline Scr 0.7-.08.  Repeat A1c, BMP 1 month  Reviewed w PCP - 79-month Rx sent to local pharmacy. Scheduled for lab visit.   Future Consideration GLP1-RA: Tolerated Ozempic  well at 0.5 mg weekly dose though cost prohibitive at start of the year. Patient does feel she will meet her deductible at some point in 2026 - discussed that Ozempic  remains an option at any point this year once affordable. Ideal for goal of weight loss to help w chronic pain.  DPP4i: N/A - Cost also high with deductible. Glp1 preferred given similar cost with greater weight benefit.  SGLT2i: Cost high. SU: Avoid given risk hypoglycemia. Patient preference to avoid medications with any risk of weight gain.  TZD: Avoid due to possible weight gain/increase in fracture risk.      Follow Up Follow up with clinical pharmacist via phone in 3 weeks Patient given direct line for questions regarding  medication therapy  Future Appointments  Date Time Provider Department Center  12/22/2024  9:00 AM Tracy Height, MD [facility] None  12/28/2024  8:00 AM Tracy Phillips, [facility] None  12/28/2024  2:00 PM [facility] PHARMACIST [facility] [address]  01/11/2025  8:15 AM [facility] [address] FABIENE Geronimo, PharmD, BCACP, CPP Clinical Pharmacist Practitioner [facility] Ph: [phone]  "

## 2024-12-07 ENCOUNTER — Ambulatory Visit
Attending: Student in an Organized Health Care Education/Training Program | Admitting: Student in an Organized Health Care Education/Training Program

## 2024-12-07 ENCOUNTER — Encounter: Payer: Self-pay | Admitting: Student in an Organized Health Care Education/Training Program

## 2024-12-07 ENCOUNTER — Telehealth: Payer: Self-pay | Admitting: Student in an Organized Health Care Education/Training Program

## 2024-12-07 VITALS — BP 155/91 | HR 94 | Temp 98.8°F | Resp 16 | Ht 64.0 in | Wt 180.0 lb

## 2024-12-07 DIAGNOSIS — Z87898 Personal history of other specified conditions: Secondary | ICD-10-CM | POA: Insufficient documentation

## 2024-12-07 DIAGNOSIS — M47816 Spondylosis without myelopathy or radiculopathy, lumbar region: Secondary | ICD-10-CM | POA: Diagnosis not present

## 2024-12-07 DIAGNOSIS — M5136 Other intervertebral disc degeneration, lumbar region with discogenic back pain only: Secondary | ICD-10-CM | POA: Diagnosis not present

## 2024-12-07 DIAGNOSIS — G894 Chronic pain syndrome: Secondary | ICD-10-CM | POA: Diagnosis not present

## 2024-12-07 MED ORDER — METFORMIN HCL ER 500 MG PO TB24: ORAL_TABLET | ORAL | 1 refills | Status: AC

## 2024-12-07 NOTE — Progress Notes (Signed)
 Safety precautions to be maintained throughout the outpatient stay will include: orient to surroundings, keep bed in low position, maintain call bell within reach at all times, provide assistance with transfer out of bed and ambulation.

## 2024-12-07 NOTE — Patient Instructions (Signed)
 Ms. JANDA CARGO,   It was a pleasure to speak with you today! As we discussed:?   Start metformin  500 mg tablet (2 tablets once daily with a meal) We discussed: You may start with 1 tablet if you prefer to be cautious, and you may increase after a couple days if no concerns for side effects.  I have sent the Metformin  Extended Release  The extended release version will have the letters XR or ER in the name. This is designed to have no stomach/bowel side effects.  Side effects with metformin  are rare with the extended release form, though note with the regular/older metformin , side effects do resolve on their own after ~2 weeks of consistent use.   Repeat labs in around 1 month. We scheduled this for the end of February (see below in appointments).   Please reach out prior to your next scheduled appointment should you have any questions or concerns.  You may reach me via MyChart Message, or you may leave me a voicemail at [phone] and I will get back to you shortly.   Thank you!   Future Appointments  Date Time Provider Department Center  12/22/2024  9:00 AM Eappen, Saramma, MD [facility] None  12/28/2024  8:00 AM Veva Almarie [facility], [facility] None  12/28/2024  2:00 PM [facility] PHARMAC[facility] [address]  01/11/2025  8:15 AM [facility] [address] Manuelita FABIENE Geronimo, PharmD, BCACP, CPP Clinical Pharmacist Practitioner [facility] Ph: [phone]

## 2024-12-07 NOTE — Telephone Encounter (Signed)
 Pt wanted to let the Dr know that she is taking Ramelteon  and doxepin  it is in her copy

## 2024-12-07 NOTE — Progress Notes (Signed)
 PROVIDER NOTE: Interpretation of information contained herein should be left to medically-trained personnel. Specific patient instructions are provided elsewhere under Patient Instructions section of medical record. This document was created in part using AI and STT-dictation technology, any transcriptional errors that may result from this process are unintentional.  Patient: Tracy Phillips  Service: E/M Encounter  Provider: Wallie Sherry, MD  DOB: [birth year]  Delivery: Face-to-face  Specialty: Interventional Pain Management  MRN: [id number]  Setting: Ambulatory outpatient facility  Specialty designation: 09  Type: New Patient  Location: Outpatient office facility  PCP: Glendia Shad, MD  DOS: 12/07/2024    Referring Prov.: Charlene Debby BROCKS, PA-C   Primary Reason(s) for Visit: Encounter for initial evaluation of one or more chronic problems (new to examiner) potentially causing chronic pain, and posing a threat to normal musculoskeletal function. (Level of risk: High) CC: Back Pain and Ankle Pain (right)  HPI  Tracy Phillips is a 59 y.o. year old, female patient, who comes for the first time to our practice referred by Charlene Debby BROCKS, PA-C for our initial evaluation of her chronic pain. She has Headache; Hypercholesterolemia; Kidney stones; History of colonic polyps; Herpes simplex; Anxiety and depression; Hot flashes; Healthcare maintenance; Hyperglycemia; Major depressive disorder, recurrent episode, severe (HCC); Alcoholic intoxication with complication; Alcohol withdrawal (HCC); MDD (major depressive disorder), recurrent severe, without psychosis (HCC); Alcohol use disorder, moderate, in early remission (HCC); Anxiety disorder; GERD (gastroesophageal reflux disease); B12 deficiency; Encounter for screening for coronary artery disease; Chest pain; AKI (acute kidney injury); Elevated troponin; Demand ischemia (HCC); Elevated CK; Hip pain; Hypokalemia; Low back pain; Hyponatremia; Major depressive  disorder, single episode; MDD (major depressive disorder), recurrent episode, moderate (HCC); History of cocaine use; High risk medication use; Pre-op evaluation; S/P total left hip arthroplasty; Ankle pain, right; Type 2 diabetes mellitus with hyperglycemia (HCC); Encounter for completion of form with patient; Insomnia; Recurrent major depressive disorder, in full remission; Degeneration of intervertebral disc of lumbar region with discogenic back pain; and Chronic pain syndrome on their problem list. Today she comes in for evaluation of her Back Pain and Ankle Pain (right)  Pain Assessment: Location: Lower Back Radiating: denies Onset: More than a month ago Duration: Chronic pain Quality: Other (Comment), Tightness, Aching (seizing) Severity: 7 /10 (subjective, self-reported pain score)  Effect on ADL: limits adls Timing: Constant Modifying factors: meds BP: (!) 155/91  HR: 94  Onset and Duration: Gradual and Date of onset: 4 years ago Cause of pain: Unknown Severity: Getting worse, NAS-11 at its worse: 8/10, NAS-11 at its best: 5/10, NAS-11 now: 7/10, and NAS-11 on the average: 7/10 Timing: Not influenced by the time of the day, During activity or exercise, After activity or exercise, and After a period of immobility Aggravating Factors: Bending, Climbing, Intercourse (sex), Kneeling, Lifiting, Motion, Prolonged sitting, Prolonged standing, Squatting, Stooping , Twisting, Walking, Walking uphill, Walking downhill, and Working Alleviating Factors: Stretching, Lying down, Medications, Resting, Sitting, and Sleeping Associated Problems: Depression, Dizziness, Fatigue, Inability to concentrate, Nausea, Numbness, Personality changes, Sadness, Spasms, Sweating, Swelling, Temperature changes, Tingling, Weakness, Pain that wakes patient up, and Pain that does not allow patient to sleep Quality of Pain: Aching, Agonizing, Annoying, Burning, Intermittent, Dull, Exhausting, Itching, Nagging,  Pressure-like, Stabbing, Tender, Uncomfortable, and Work related Previous Examinations or Tests: CT scan, MRI scan, X-rays, Orthopedic evaluation, Chiropractic evaluation, and Psychiatric evaluation Previous Treatments: Narcotic medications and Steroid treatments by mouth  Tracy Phillips is being evaluated for possible interventional pain management therapies for the treatment of her  chronic pain.   Discussed the use of AI scribe software for clinical note transcription with the patient, who gave verbal consent to proceed.  History of Present Illness   Tracy Phillips is a 59 year old female who presents for pain management consultation for chronic lower back pain.  She experiences chronic pain primarily located in her lower back, which worsens after prolonged sitting or riding in a car. The pain is described as stiffness and does not radiate into her legs. Significant stiffness occurs upon standing after sitting for a period, making movement difficult.  Her occupation involves physical activities such as sweeping, mopping, and vacuuming, which she can manage, but she experiences increased stiffness and difficulty moving after resting post-work. She works a nine to six job with an hour lunch break and feels exhausted by the end of the day, often going straight to bed after eating.  She has a history of a sacroiliac fracture discovered during an investigation of hip issues, and she underwent a left hip replacement.  Her current medications include Celebrex , methocarbamol , and tramadol  50 mg, which she takes every eight hours. Tramadol  provides some relief, allowing her to feel 'like a normal person again,' without causing sedation or drowsiness. She is also on gabapentin  300 mg once nightly, which was reduced from six times a day, primarily for sleep issues.  She has not undergone physical therapy for her back pain and expresses concern about fitting it into her schedule due to her work  commitments.  She is in communication with her pharmacist regarding starting metformin  for diabetes management, although she has not yet received the prescription.   Patient also has a history of substance abuse with cocaine and alcohol.  She states that she has been sober for almost a year now.       Historic Controlled Substance Pharmacotherapy Review 11/29/2024 11/29/2024  1 Tramadol  Hcl 50 Mg Tablet 30.00 10 Valeta Brilliant [id number] Nor ([id number]) 0/0 30.00 MME Comm Ins [place]  11/25/2024 11/25/2024  1 Gabapentin  600 Mg Tablet 30.00 30 Sa Eap [id number] Nor ([id number]) 0/2  Comm Ins [place]  11/22/2024 11/22/2024  1 Tramadol  Hcl 50 Mg Tablet 20.00 10 Valeta Brilliant [id number] Nor ([id number]) 0/0 20.00 MME Comm Ins [place]  11/12/2024 11/09/2024  1 Tramadol  Hcl 50 Mg Tablet 20.00 10 Th Gai [id number] Nor ([id number]) 0/0 20.00 MME Comm Ins [place]   Historical Monitoring: The patient  reports that she does not currently use drugs after having used the following drugs: Crack cocaine. List of prior UDS Testing: Lab Results  Component Value Date   MDMA NONE DETECTED 12/31/2023   MDMA NONE DETECTED 09/11/2023   MDMA NONE DETECTED 04/03/2023   COCAINSCRNUR NONE DETECTED 12/31/2023   COCAINSCRNUR POSITIVE (A) 09/11/2023   COCAINSCRNUR POSITIVE (A) 04/03/2023   PCPSCRNUR NONE DETECTED 12/31/2023   PCPSCRNUR NONE DETECTED 09/11/2023   PCPSCRNUR NONE DETECTED 04/03/2023   THCU NONE DETECTED 12/31/2023   THCU NONE DETECTED 09/11/2023   THCU NONE DETECTED 04/03/2023   ETH 225 (H) 12/31/2023   ETH <10 12/11/2023   ETH 307 (HH) 04/03/2023   Historical Background Evaluation: [place] PMP: PDMP not reviewed this encounter. Review of the past 4-months conducted.             [place] Department of public safety, offender search: Engineer, [employer] Information) Non-contributory Risk Assessment Profile: Aberrant behavior: None observed or detected today Risk factors for fatal opioid overdose: None identified today  Fatal overdose hazard ratio (HR): Calculation  deferred Non-fatal overdose  hazard ratio (HR): Calculation deferred Risk of opioid abuse or dependence: 0.7-3.0% with doses <= 36 MME/day and 6.1-26% with doses >= 120 MME/day. Substance use disorder (SUD) risk level: See below Personal History of Substance Abuse (SUD-Substance use disorder):  Alcohol: Positive Female or Female  Illegal Drugs: Negative  Rx Drugs: Negative  ORT Risk Level calculation: High Risk  Opioid Risk Tool - 12/07/24 1419       Family History of Substance Abuse   Alcohol Positive Female    Illegal Drugs Negative    Rx Drugs Negative      Personal History of Substance Abuse   Alcohol Positive Female or Female    Illegal Drugs Negative    Rx Drugs Negative      Age   Age between 44-45 years  Yes      History of Preadolescent Sexual Abuse   History of Preadolescent Sexual Abuse Negative or Female      Psychological Disease   Psychological Disease Negative    Depression Positive      Total Score   Opioid Risk Tool Scoring 8    Opioid Risk Interpretation High Risk         ORT Scoring interpretation table:  Score <3 = Low Risk for SUD  Score between 4-7 = Moderate Risk for SUD  Score >8 = High Risk for Opioid Abuse   PHQ-2 Depression Scale:  Total score: 0  PHQ-2 Scoring interpretation table: (Score and probability of major depressive disorder)  Score 0 = No depression  Score 1 = 15.4% Probability  Score 2 = 21.1% Probability  Score 3 = 38.4% Probability  Score 4 = 45.5% Probability  Score 5 = 56.4% Probability  Score 6 = 78.6% Probability   PHQ-9 Depression Scale:  Total score: 0  PHQ-9 Scoring interpretation table:  Score 0-4 = No depression  Score 5-9 = Mild depression  Score 10-14 = Moderate depression  Score 15-19 = Moderately severe depression  Score 20-27 = Severe depression (2.4 times higher risk of SUD and 2.89 times higher risk of overuse)   Pharmacologic Plan: As per protocol, I have not taken over any controlled substance management,  pending the results of ordered tests and/or consults.            Initial impression: Pending review of available data and ordered tests.  Meds  Current Medications[1]  Imaging Review    MR LUMBAR SPINE WO CONTRAST  Narrative CLINICAL DATA:  Left hip pain, low back pain and pain in left buttock. Legs feel weak. Numbness in legs and left hand.  EXAM: MRI LUMBAR SPINE WITHOUT CONTRAST  TECHNIQUE: Multiplanar, multisequence MR imaging of the lumbar spine was performed. No intravenous contrast was administered.  COMPARISON:  None Available.  FINDINGS: Segmentation: Standard.  Alignment:  No substantial sagittal subluxation.  Vertebrae: Partially imaged edema in the left sacral ala, compatible with acute/recent insufficiency fracture. No evidence of other fracture, evidence of discitis, or suspicious bone lesion.  Conus medullaris and cauda equina: Conus extends to the T12-L1 level. Conus and cauda equina appear normal.  Paraspinal and other soft tissues: Unremarkable.  Disc levels:  T12-L1: No significant disc protrusion, foraminal stenosis, or canal stenosis.  L1-L2: No significant disc protrusion, foraminal stenosis, or canal stenosis.  L2-L3: Disc bulging and right foraminal disc protrusion. No significant canal or foraminal stenosis.  L3-L4: Disc bulging in left greater than right facet arthropathy. Resulting mild to moderate left and mild right foraminal stenosis. Patent canal.  L4-L5: Disc bulging with small central foraminal disc protrusion. Bilateral facet uncovertebral hypertrophy. Mild bilateral foraminal stenosis. Mild canal stenosis.  L5-S1: Bilateral facet arthropathy.  No significant stenosis.  IMPRESSION: 1. Partially imaged edema in the left sacral ala, compatible with acute/recent insufficiency fracture. 2. At L3-L4, mild to moderate left and mild right foraminal stenosis. 3. At L4-L5, mild bilateral foraminal stenosis and mild  canal stenosis.  These results will be called to the ordering clinician or representative by the Radiologist Assistant, and communication documented in the PACS or [employer].   Electronically Signed By: Gilmore GORMAN Molt M.D. On: 12/27/2023 10:44   Narrative CLINICAL DATA:  Elective surgery.  EXAM: DG HIP (WITH OR WITHOUT PELVIS) 2-3V LEFT  COMPARISON:  None Available.  FINDINGS: Three fluoroscopic spot views of the pelvis and left hip obtained in the operating room. Images during hip arthroplasty. Fluoroscopy time 19 seconds. Dose 3.07 mGy.  IMPRESSION: Intraoperative fluoroscopy during left hip arthroplasty.   Electronically Signed By: Andrea Gasman M.D. On: 03/18/2024 13:08   Complexity Note: Imaging results reviewed.                         ROS  Cardiovascular: No reported cardiovascular signs or symptoms such as High blood pressure, coronary artery disease, abnormal heart rate or rhythm, heart attack, blood thinner therapy or heart weakness and/or failure Pulmonary or Respiratory: No reported pulmonary signs or symptoms such as wheezing and difficulty taking a deep full breath (Asthma), difficulty blowing air out (Emphysema), coughing up mucus (Bronchitis), persistent dry cough, or temporary stoppage of breathing during sleep Neurological: No reported neurological signs or symptoms such as seizures, abnormal skin sensations, urinary and/or fecal incontinence, being born with an abnormal open spine and/or a tethered spinal cord Psychological-Psychiatric: Anxiousness, Depressed, Prone to panicking, and Difficulty sleeping and or falling asleep Gastrointestinal: No reported gastrointestinal signs or symptoms such as vomiting or evacuating blood, reflux, heartburn, alternating episodes of diarrhea and constipation, inflamed or scarred liver, or pancreas or irrregular and/or infrequent bowel movements Genitourinary: Passing kidney stones Hematological: Brusing  easily Endocrine: High blood sugar controlled without the use of insulin (NIDDM) Rheumatologic: No reported rheumatological signs and symptoms such as fatigue, joint pain, tenderness, swelling, redness, heat, stiffness, decreased range of motion, with or without associated rash Musculoskeletal: Negative for myasthenia gravis, muscular dystrophy, multiple sclerosis or malignant hyperthermia Work History: Working full time  Allergies  Tracy Phillips is allergic to aleve [naproxen sodium].  Laboratory Chemistry Profile   Renal Lab Results  Component Value Date   BUN 17 07/27/2024   CREATININE 0.77 07/27/2024   GFR 86.58 07/27/2024   GFRAA >90 01/10/2013   GFRNONAA >60 06/06/2024   PROTEINUR 30 (A) 03/05/2024     Electrolytes Lab Results  Component Value Date   NA 141 07/27/2024   K 4.9 07/27/2024   CL 105 07/27/2024   CALCIUM  9.4 07/27/2024   MG 2.1 01/05/2024   PHOS 2.6 04/05/2023     Hepatic Lab Results  Component Value Date   AST 17 07/27/2024   ALT 16 07/27/2024   ALBUMIN 4.4 07/27/2024   ALKPHOS 102 07/27/2024     ID Lab Results  Component Value Date   HIV Non Reactive 04/04/2023   STAPHAUREUS NEGATIVE 03/05/2024   MRSAPCR NEGATIVE 03/05/2024     Bone Lab Results  Component Value Date   VD25OH 46.37 04/08/2023     Endocrine Lab Results  Component Value Date   GLUCOSE  85 07/27/2024   GLUCOSEU NEGATIVE 03/05/2024   HGBA1C 7.0 (H) 07/27/2024   TSH 3.79 02/24/2024     Neuropathy Lab Results  Component Value Date   VITAMINB12 316 06/20/2023   HGBA1C 7.0 (H) 07/27/2024   HIV Non Reactive 04/04/2023     CNS No results found for: COLORCSF, APPEARCSF, RBCCOUNTCSF, WBCCSF, POLYSCSF, LYMPHSCSF, EOSCSF, PROTEINCSF, GLUCCSF, JCVIRUS, CSFOLI, IGGCSF, LABACHR, ACETBL   Inflammation (CRP: Acute  ESR: Chronic) Lab Results  Component Value Date   CRP <1.0 10/23/2023   ESRSEDRATE 7 01/02/2024     Rheumatology Lab Results   Component Value Date   RF <10 06/20/2023   ANA POSITIVE (A) 06/20/2023     Coagulation Lab Results  Component Value Date   PLT 236 06/06/2024   DDIMER 0.49 01/01/2024     Cardiovascular Lab Results  Component Value Date   CKTOTAL 28 10/23/2023   HGB 14.7 06/06/2024   HCT 44.3 06/06/2024     Screening Lab Results  Component Value Date   STAPHAUREUS NEGATIVE 03/05/2024   MRSAPCR NEGATIVE 03/05/2024   HIV Non Reactive 04/04/2023     Cancer No results found for: CEA, CA125, LABCA2   Allergens No results found for: ALMOND, APPLE, ASPARAGUS, AVOCADO, BANANA, BARLEY, BASIL, BAYLEAF, GREENBEAN, LIMABEAN, WHITEBEAN, BEEFIGE, REDBEET, BLUEBERRY, BROCCOLI, CABBAGE, MELON, CARROT, CASEIN, CASHEWNUT, CAULIFLOWER, CELERY     Note: Lab results reviewed.  PFSH  Drug: Tracy Phillips  reports that she does not currently use drugs after having used the following drugs: Crack cocaine. Alcohol:  reports no history of alcohol use. Tobacco:  reports that she has never smoked. She has never used smokeless tobacco. Medical:  has a past medical history of Alcohol abuse, Anemia, Anxiety, Crack cocaine use, Demand ischemia (HCC), Depression, Family history of adverse reaction to anesthesia, GERD (gastroesophageal reflux disease), Gestational diabetes, H/O dizziness, H/O eating disorder, Herpes, History of chicken pox, History of colon polyps, History of kidney stones, Hyperlipidemia, and Vitamin B12 deficiency. Family: family history includes Alcohol abuse in her maternal uncle and paternal grandfather; Arthritis in her father, maternal aunt, maternal grandfather, maternal grandmother, maternal uncle, paternal aunt, paternal grandfather, paternal grandmother, and paternal uncle; Breast cancer (age of onset: 50) in her mother; Dementia in her father; Depression in her brother, brother, maternal aunt, and mother; Diabetes in her father; Hyperlipidemia in  her father, maternal aunt, maternal grandfather, maternal grandmother, maternal uncle, mother, paternal aunt, paternal grandfather, paternal grandmother, and paternal uncle; Hypertension in her brother, father, maternal aunt, maternal grandfather, maternal grandmother, maternal uncle, mother, paternal aunt, paternal grandfather, paternal grandmother, and paternal uncle; Lung cancer in her maternal grandfather; Prostate cancer in her maternal grandfather; Stroke in her maternal grandfather; Sudden death in her paternal grandfather.  Past Surgical History:  Procedure Laterality Date   ABDOMINAL HYSTERECTOMY  11/18/1996   APPENDECTOMY  11/19/1995   COLONOSCOPY     DILATION AND CURETTAGE OF UTERUS     multiple   TONSILLECTOMY  11/19/1995   TOTAL HIP ARTHROPLASTY Left 03/18/2024   Procedure: ARTHROPLASTY, HIP, TOTAL, ANTERIOR APPROACH;  Surgeon: Lorelle Hussar, MD;  Location: [facility] ORS;  Service: Orthopedics;  Laterality: Left;   WRIST FRACTURE SURGERY Left    Active Ambulatory Problems    Diagnosis Date Noted   Headache 11/07/2013   Hypercholesterolemia 11/07/2013   Kidney stones 11/07/2013   History of colonic polyps 11/07/2013   Herpes simplex 11/07/2013   Anxiety and depression 09/07/2017   Hot flashes 09/07/2017   Healthcare maintenance 09/26/2017  Hyperglycemia 05/02/2019   Major depressive disorder, recurrent episode, severe (HCC) 04/03/2023   Alcoholic intoxication with complication 04/03/2023   Alcohol withdrawal (HCC) 04/04/2023   MDD (major depressive disorder), recurrent severe, without psychosis (HCC) 04/06/2023   Alcohol use disorder, moderate, in early remission (HCC) 04/07/2023   Anxiety disorder 04/07/2023   GERD (gastroesophageal reflux disease) 04/07/2023   B12 deficiency 06/20/2023   Encounter for screening for coronary artery disease 06/22/2023   Chest pain 09/10/2023   AKI (acute kidney injury) 09/10/2023   Elevated troponin 09/10/2023   Demand ischemia (HCC)  09/11/2023   Elevated CK 10/26/2023   Hip pain 10/26/2023   Hypokalemia 10/26/2023   Low back pain 11/30/2023   Hyponatremia 01/05/2024   Major depressive disorder, single episode 11/07/2013   MDD (major depressive disorder), recurrent episode, moderate (HCC) 02/09/2024   History of cocaine use 02/09/2024   High risk medication use 02/09/2024   Pre-op evaluation 02/29/2024   S/P total left hip arthroplasty 03/18/2024   Ankle pain, right 07/27/2024   Type 2 diabetes mellitus with hyperglycemia (HCC) 07/30/2024   Encounter for completion of form with patient 08/01/2024   Insomnia 11/03/2024   Recurrent major depressive disorder, in full remission 11/03/2024   Degeneration of intervertebral disc of lumbar region with discogenic back pain 12/07/2024   Chronic pain syndrome 12/07/2024   Resolved Ambulatory Problems    Diagnosis Date Noted   Muscle ache 06/20/2023   Nausea and vomiting 06/22/2023   Past Medical History:  Diagnosis Date   Alcohol abuse    Anemia    Anxiety    Crack cocaine use    Depression    Family history of adverse reaction to anesthesia    Gestational diabetes    H/O dizziness    H/O eating disorder    Herpes    History of chicken pox    History of colon polyps    History of kidney stones    Hyperlipidemia    Vitamin B12 deficiency    Constitutional Exam  General appearance: Well nourished, well developed, and well hydrated. In no apparent acute distress Vitals:   12/07/24 1423  BP: (!) 155/91  Pulse: 94  Resp: 16  Temp: 98.8 F (37.1 C)  SpO2: 96%  Weight: 180 lb (81.6 kg)  Height: 5' 4 (1.626 m)   BMI Assessment: Estimated body mass index is 30.9 kg/m as calculated from the following:   Height as of this encounter: 5' 4 (1.626 m).   Weight as of this encounter: 180 lb (81.6 kg).  BMI interpretation table: BMI level Category Range association with higher incidence of chronic pain  <18 kg/m2 Underweight   18.5-24.9 kg/m2 Ideal body  weight   25-29.9 kg/m2 Overweight Increased incidence by 20%  30-34.9 kg/m2 Obese (Class I) Increased incidence by 68%  35-39.9 kg/m2 Severe obesity (Class II) Increased incidence by 136%  >40 kg/m2 Extreme obesity (Class III) Increased incidence by 254%   Patient's current BMI Ideal Body weight  Body mass index is 30.9 kg/m. Ideal body weight: 54.7 kg (120 lb 9.5 oz) Adjusted ideal body weight: 65.5 kg (144 lb 5.7 oz)   BMI Readings from Last 4 Encounters:  12/07/24 30.90 kg/m  07/30/24 32.95 kg/m  07/27/24 32.10 kg/m  06/06/24 31.71 kg/m   Wt Readings from Last 4 Encounters:  12/07/24 180 lb (81.6 kg)  07/30/24 186 lb (84.4 kg)  07/27/24 181 lb 3.2 oz (82.2 kg)  06/06/24 179 lb (81.2 kg)    Psych/Mental  status: Alert, oriented x 3 (person, place, & time)       Eyes: PERLA Respiratory: No evidence of acute respiratory distress  Lumbar Spine Area Exam  Skin & Axial Inspection: No masses, redness, or swelling Alignment: Symmetrical Functional ROM: Pain restricted ROM affecting both sides Stability: No instability detected Muscle Tone/Strength: Functionally intact. No obvious neuro-muscular anomalies detected. Sensory (Neurological): Musculoskeletal pain pattern Palpation: No palpable anomalies       Provocative Tests: Hyperextension/rotation test: deferred today       Lumbar quadrant test (Kemp's test): (+) bilaterally for facet joint pain. Lateral bending test: (+) due to pain. Patrick's Maneuver: deferred today                    Gait & Posture Assessment  Ambulation: Unassisted Gait: Relatively normal for age and body habitus Posture: WNL  Lower Extremity Exam    Side: Right lower extremity  Side: Left lower extremity  Stability: No instability observed          Stability: No instability observed          Skin & Extremity Inspection: Skin color, temperature, and hair growth are WNL. No peripheral edema or cyanosis. No masses, redness, swelling, asymmetry, or  associated skin lesions. No contractures.  Skin & Extremity Inspection: Skin color, temperature, and hair growth are WNL. No peripheral edema or cyanosis. No masses, redness, swelling, asymmetry, or associated skin lesions. No contractures.  Functional ROM: Unrestricted ROM                  Functional ROM: Unrestricted ROM                  Muscle Tone/Strength: Functionally intact. No obvious neuro-muscular anomalies detected.  Muscle Tone/Strength: Functionally intact. No obvious neuro-muscular anomalies detected.  Sensory (Neurological): Unimpaired        Sensory (Neurological): Unimpaired        DTR: Patellar: deferred today Achilles: deferred today Plantar: deferred today  DTR: Patellar: deferred today Achilles: deferred today Plantar: deferred today  Palpation: No palpable anomalies  Palpation: No palpable anomalies    Assessment  Primary Diagnosis & Pertinent Problem List: The primary encounter diagnosis was Lumbar facet arthropathy. Diagnoses of Lumbar spondylosis, Degeneration of intervertebral disc of lumbar region with discogenic back pain, and Chronic pain syndrome were also pertinent to this visit.  Visit Diagnosis (New problems to examiner): 1. Lumbar facet arthropathy   2. Lumbar spondylosis   3. Degeneration of intervertebral disc of lumbar region with discogenic back pain   4. Chronic pain syndrome    Plan of Care (Initial workup plan)  General Recommendations: The pain condition that the patient suffers from is best treated with a multidisciplinary approach that involves an increase in physical activity to prevent de-conditioning and worsening of the pain cycle, as well as psychological counseling (formal and/or informal) to address the co-morbid psychological affects of pain. Treatment will often involve judicious use of  medications and interventional procedures to decrease the pain, allowing the patient to participate in the physical activity that will ultimately  produce long-lasting pain reductions. The goal of the multidisciplinary approach is to return the patient to a higher level of overall function and to restore their ability to perform activities of daily living.   Assessment and Plan    Lumbar facet arthropathy and spondylosis with chronic pain   Chronic lower back pain is primarily due to lumbar facet arthropathy and spondylosis, worsened by prolonged sitting and certain  positions. Pain is localized to the lower back without leg radiation. A previous MRI showed a sacroiliac fracture, but current symptoms suggest arthritic changes in the lumbar spine. She is currently managed with tramadol , Celebrex , and methocarbamol , with tramadol  providing some relief and no sedation or drowsiness. Financial constraints affect treatment options. A baseline urine screen for opioid management is ordered. 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  The patient has tried various conservative therapeutic options such as NSAIDs, Tylenol , muscle relaxants, physical therapy which was inadequately effective.  Patient's pain is predominantly axial with physical exam and L-MRI findings suggestive of facet arthropathy.Lumbar facet medial branch nerve blocks were discussed with the patient.  Risks and benefits were reviewed.  Patient would like to proceed with bilateral L3, L4, L5 medial branch nerve block.  From a medication management standpoint we will focus on low-dose tramadol  or Butrans patch being mindful of the patient's previous substance abuse history.  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  She will need monthly medication management appointments if she elects to proceed with tramadol  after review of her urine toxicology screen.   Butrans patch may be a safer long-term option for her given her prior history.  She sees psychiatry for major depressive disorder and is currently on gabapentin  dose of which was recently reduced.        Lab Orders         Compliance Drug Analysis, Ur      Procedure Orders         LUMBAR FACET(MEDIAL BRANCH NERVE BLOCK) MBNB     Interventional management options: Tracy Phillips was informed that there is no guarantee that she would be a candidate for interventional therapies. The decision will be based on the results of diagnostic studies, as well as Tracy Phillips's risk profile.  Procedure(s) under consideration:  Diagnostic lumbar facet medial branch nerve blocks    Provider-requested follow-up: Return in about 13 days (around 12/20/2024) for B/L L3, 4, 5 MBNB #1 , in clinic IV Versed  (NO STEROIDS).  Future Appointments  Date Time Provider Department Center  12/22/2024  9:00 AM Eappen, Saramma, MD [facility] None  12/28/2024  8:00 AM Veva Almarie [facility], [facility] None  01/11/2025  8:15 AM [facility] [address]   I discussed the assessment and treatment plan with the patient. The patient was provided an opportunity to ask questions and all were answered. The patient agreed with the plan and demonstrated an understanding of the instructions.  Patient advised to call back or seek an in-person evaluation if the symptoms or condition worsens.  I personally spent a total of 60 minutes in the care of the patient today including preparing to see the patient, getting/reviewing separately obtained history, performing a medically appropriate exam/evaluation, counseling and educating, placing orders, and documenting clinical information in the EHR.   Note by: Wallie Sherry, MD (TTS and AI technology used. I apologize for any typographical errors that were not detected and corrected.) Date: 12/07/2024; Time: 3:31 PM     [1]  Current Outpatient Medications:    busPIRone   (BUSPAR ) 10 MG tablet, TAKE 1 TABLET BY MOUTH TWICE A DAY, Disp: 180 tablet, Rfl: 1   doxepin  (SINEQUAN ) 10 MG capsule, Take  1 capsule (10 mg total) by mouth at bedtime. Stop Ramelteon , Disp: 30 capsule, Rfl: 0   gabapentin  (NEURONTIN ) 600 MG tablet, Take 0.5 tablets (300 mg total) by mouth at bedtime. Dose change, Disp: , Rfl:    methocarbamol  (ROBAXIN ) 500 MG tablet, Take 500 mg by mouth 4 (four) times daily., Disp: , Rfl:    traMADol  (ULTRAM ) 50 MG tablet, Take by mouth every 6 (six) hours as needed., Disp: , Rfl:    venlafaxine  XR (EFFEXOR  XR) 150 MG 24 hr capsule, Take 1 capsule (150 mg total) by mouth daily with breakfast., Disp: 90 capsule, Rfl: 0

## 2024-12-07 NOTE — Patient Instructions (Signed)
GENERAL RISKS AND COMPLICATIONS  What are the risk, side effects and possible complications? Generally speaking, most procedures are safe.  However, with any procedure there are risks, side effects, and the possibility of complications.  The risks and complications are dependent upon the sites that are lesioned, or the type of nerve block to be performed.  The closer the procedure is to the spine, the more serious the risks are.  Great care is taken when placing the radio frequency needles, block needles or lesioning probes, but sometimes complications can occur. Infection: Any time there is an injection through the skin, there is a risk of infection.  This is why sterile conditions are used for these blocks.  There are four possible types of infection. Localized skin infection. Central Nervous System Infection-This can be in the form of Meningitis, which can be deadly. Epidural Infections-This can be in the form of an epidural abscess, which can cause pressure inside of the spine, causing compression of the spinal cord with subsequent paralysis. This would require an emergency surgery to decompress, and there are no guarantees that the patient would recover from the paralysis. Discitis-This is an infection of the intervertebral discs.  It occurs in about 1% of discography procedures.  It is difficult to treat and it may lead to surgery.        2. Pain: the needles have to go through skin and soft tissues, will cause soreness.       3. Damage to internal structures:  The nerves to be lesioned may be near blood vessels or    other nerves which can be potentially damaged.       4. Bleeding: Bleeding is more common if the patient is taking blood thinners such as  aspirin, Coumadin, Ticiid, Plavix, etc., or if he/she have some genetic predisposition  such as hemophilia. Bleeding into the spinal canal can cause compression of the spinal  cord with subsequent paralysis.  This would require an emergency  surgery to  decompress and there are no guarantees that the patient would recover from the  paralysis.       5. Pneumothorax:  Puncturing of a lung is a possibility, every time a needle is introduced in  the area of the chest or upper back.  Pneumothorax refers to free air around the  collapsed lung(s), inside of the thoracic cavity (chest cavity).  Another two possible  complications related to a similar event would include: Hemothorax and Chylothorax.   These are variations of the Pneumothorax, where instead of air around the collapsed  lung(s), you may have blood or chyle, respectively.       6. Spinal headaches: They may occur with any procedures in the area of the spine.       7. Persistent CSF (Cerebro-Spinal Fluid) leakage: This is a rare problem, but may occur  with prolonged intrathecal or epidural catheters either due to the formation of a fistulous  track or a dural tear.       8. Nerve damage: By working so close to the spinal cord, there is always a possibility of  nerve damage, which could be as serious as a permanent spinal cord injury with  paralysis.       9. Death:  Although rare, severe deadly allergic reactions known as "Anaphylactic  reaction" can occur to any of the medications used.      10. Worsening of the symptoms:  We can always make thing worse.  What are the chances  of something like this happening? Chances of any of this occuring are extremely low.  By statistics, you have more of a chance of getting killed in a motor vehicle accident: while driving to the hospital than any of the above occurring .  Nevertheless, you should be aware that they are possibilities.  In general, it is similar to taking a shower.  Everybody knows that you can slip, hit your head and get killed.  Does that mean that you should not shower again?  Nevertheless always keep in mind that statistics do not mean anything if you happen to be on the wrong side of them.  Even if a procedure has a 1 (one) in a  1,000,000 (million) chance of going wrong, it you happen to be that one..Also, keep in mind that by statistics, you have more of a chance of having something go wrong when taking medications.  Who should not have this procedure? If you are on a blood thinning medication (e.g. Coumadin, Plavix, see list of "Blood Thinners"), or if you have an active infection going on, you should not have the procedure.  If you are taking any blood thinners, please inform your physician.  How should I prepare for this procedure? Do not eat or drink anything at least six hours prior to the procedure. Bring a driver with you .  It cannot be a taxi. Come accompanied by an adult that can drive you back, and that is strong enough to help you if your legs get weak or numb from the local anesthetic. Take all of your medicines the morning of the procedure with just enough water to swallow them. If you have diabetes, make sure that you are scheduled to have your procedure done first thing in the morning, whenever possible. If you have diabetes, take only half of your insulin dose and notify our nurse that you have done so as soon as you arrive at the clinic. If you are diabetic, but only take blood sugar pills (oral hypoglycemic), then do not take them on the morning of your procedure.  You may take them after you have had the procedure. Do not take aspirin or any aspirin-containing medications, at least eleven (11) days prior to the procedure.  They may prolong bleeding. Wear loose fitting clothing that may be easy to take off and that you would not mind if it got stained with Betadine or blood. Do not wear any jewelry or perfume Remove any nail coloring.  It will interfere with some of our monitoring equipment.  NOTE: Remember that this is not meant to be interpreted as a complete list of all possible complications.  Unforeseen problems may occur.  BLOOD THINNERS The following drugs contain aspirin or other products,  which can cause increased bleeding during surgery and should not be taken for 2 weeks prior to and 1 week after surgery.  If you should need take something for relief of minor pain, you may take acetaminophen which is found in Tylenol,m Datril, Anacin-3 and Panadol. It is not blood thinner. The products listed below are.  Do not take any of the products listed below in addition to any listed on your instruction sheet.  A.P.C or A.P.C with Codeine Codeine Phosphate Capsules #3 Ibuprofen Ridaura  ABC compound Congesprin Imuran rimadil  Advil Cope Indocin Robaxisal  Alka-Seltzer Effervescent Pain Reliever and Antacid Coricidin or Coricidin-D  Indomethacin Rufen  Alka-Seltzer plus Cold Medicine Cosprin Ketoprofen S-A-C Tablets  Anacin Analgesic Tablets or Capsules Coumadin  Korlgesic Salflex  Anacin Extra Strength Analgesic tablets or capsules CP-2 Tablets Lanoril Salicylate  Anaprox Cuprimine Capsules Levenox Salocol  Anexsia-D Dalteparin Magan Salsalate  Anodynos Darvon compound Magnesium Salicylate Sine-off  Ansaid Dasin Capsules Magsal Sodium Salicylate  Anturane Depen Capsules Marnal Soma  APF Arthritis pain formula Dewitt's Pills Measurin Stanback  Argesic Dia-Gesic Meclofenamic Sulfinpyrazone  Arthritis Bayer Timed Release Aspirin Diclofenac Meclomen Sulindac  Arthritis pain formula Anacin Dicumarol Medipren Supac  Analgesic (Safety coated) Arthralgen Diffunasal Mefanamic Suprofen  Arthritis Strength Bufferin Dihydrocodeine Mepro Compound Suprol  Arthropan liquid Dopirydamole Methcarbomol with Aspirin Synalgos  ASA tablets/Enseals Disalcid Micrainin Tagament  Ascriptin Doan's Midol Talwin  Ascriptin A/D Dolene Mobidin Tanderil  Ascriptin Extra Strength Dolobid Moblgesic Ticlid  Ascriptin with Codeine Doloprin or Doloprin with Codeine Momentum Tolectin  Asperbuf Duoprin Mono-gesic Trendar  Aspergum Duradyne Motrin or Motrin IB Triminicin  Aspirin plain, buffered or enteric coated  Durasal Myochrisine Trigesic  Aspirin Suppositories Easprin Nalfon Trillsate  Aspirin with Codeine Ecotrin Regular or Extra Strength Naprosyn Uracel  Atromid-S Efficin Naproxen Ursinus  Auranofin Capsules Elmiron Neocylate Vanquish  Axotal Emagrin Norgesic Verin  Azathioprine Empirin or Empirin with Codeine Normiflo Vitamin E  Azolid Emprazil Nuprin Voltaren  Bayer Aspirin plain, buffered or children's or timed BC Tablets or powders Encaprin Orgaran Warfarin Sodium  Buff-a-Comp Enoxaparin Orudis Zorpin  Buff-a-Comp with Codeine Equegesic Os-Cal-Gesic   Buffaprin Excedrin plain, buffered or Extra Strength Oxalid   Bufferin Arthritis Strength Feldene Oxphenbutazone   Bufferin plain or Extra Strength Feldene Capsules Oxycodone with Aspirin   Bufferin with Codeine Fenoprofen Fenoprofen Pabalate or Pabalate-SF   Buffets II Flogesic Panagesic   Buffinol plain or Extra Strength Florinal or Florinal with Codeine Panwarfarin   Buf-Tabs Flurbiprofen Penicillamine   Butalbital Compound Four-way cold tablets Penicillin   Butazolidin Fragmin Pepto-Bismol   Carbenicillin Geminisyn Percodan   Carna Arthritis Reliever Geopen Persantine   Carprofen Gold's salt Persistin   Chloramphenicol Goody's Phenylbutazone   Chloromycetin Haltrain Piroxlcam   Clmetidine heparin Plaquenil   Cllnoril Hyco-pap Ponstel   Clofibrate Hydroxy chloroquine Propoxyphen         Before stopping any of these medications, be sure to consult the physician who ordered them.  Some, such as Coumadin (Warfarin) are ordered to prevent or treat serious conditions such as "deep thrombosis", "pumonary embolisms", and other heart problems.  The amount of time that you may need off of the medication may also vary with the medication and the reason for which you were taking it.  If you are taking any of these medications, please make sure you notify your pain physician before you undergo any procedures.         Moderate Conscious  Sedation, Adult Sedation is the use of medicines to help you relax and not feel pain. Moderate conscious sedation is a type of sedation that makes you less alert than normal. You are still able to respond to instructions, touch, or both. This type of sedation is used during short medical and dental procedures. It is milder than deep sedation, which is a type of sedation you cannot be easily woken up from. It is also milder than general anesthesia, which is the use of medicines to make you fall asleep. Moderate conscious sedation lets you return to your normal activities sooner. Tell a health care provider about: Any allergies you have. All medicines you are taking, including vitamins, herbs, steroids, eye drops, creams, and over-the-counter medicines. Any problems you or family members have had with anesthesia.  Any bleeding problems you have. Any surgeries you have had. Any medical conditions you have. Whether you are pregnant or may be pregnant. Any recent alcohol, tobacco, or drug use. What are the risks? Your health care provider will talk with you about risks. These may include: Oversedation. This is when you get too much medicine. Nausea or vomiting. Allergic reaction to medicines. Trouble breathing. If this happens, a breathing tube may be used. It will be removed when you can breathe better on your own. Heart trouble. Lung trouble. Emergence delirium. This is when you feel confused while the sedation wears off. This gets better with time. What happens before the procedure? When to stop eating and drinking Follow instructions from your health care provider about what you may eat and drink. These may include: 8 hours before your procedure Stop eating most foods. Do not eat meat, fried foods, or fatty foods. Eat only light foods, such as toast or crackers. All liquids are okay except energy drinks and alcohol. 6 hours before your procedure Stop eating. Drink only clear liquids, such  as water, clear fruit juice, black coffee, plain tea, and sports drinks. Do not drink energy drinks or alcohol. 2 hours before your procedure Stop drinking all liquids. You may be allowed to take medicines with small sips of water. If you do not follow your health care provider's instructions, your procedure may be delayed or canceled. Medicines Ask your health care provider about: Changing or stopping your regular medicines. These include any diabetes medicines or blood thinners you take. Taking medicines such as aspirin and ibuprofen. These medicines can thin your blood. Do not take them unless your health care provider tells you to. Taking over-the-counter medicines, vitamins, herbs, and supplements. Tests and exams You may have an exam or testing. You may have a blood or urine sample taken. General instructions Do not use any products that contain nicotine or tobacco for at least 4 weeks before the procedure. These products include cigarettes, chewing tobacco, and vaping devices, such as e-cigarettes. If you need help quitting, ask your health care provider. If you will be going home right after the procedure, plan to have a responsible adult: Take you home from the hospital or clinic. You will not be allowed to drive. Care for you for the time you are told. What happens during the procedure?  You will be given the sedative. It may be given: As a pill you can take by mouth. It can also be put into the rectum. As a spray through the nose. As an injection into muscle. As an injection into a vein through an IV. You may be given oxygen as needed. Your blood pressure, heart rate, breathing rate, and blood oxygen level will be monitored during the procedure. The medical or dental procedure will be done. The procedure may vary among health care providers and hospitals. What happens after the procedure? Your blood pressure, heart rate, breathing rate, and blood oxygen level will be  monitored until you leave the hospital or clinic. You will get fluids through an IV as needed. Do not drive or operate machinery until your health care provider says that it is safe. This information is not intended to replace advice given to you by your health care provider. Make sure you discuss any questions you have with your health care provider. Document Revised: 05/20/2022 Document Reviewed: 05/20/2022 Elsevier Patient Education  2024 [employer] Facet Blocks Patient Information  Description: The facets are joints in the spine between  the vertebrae.  Like any joints in the body, facets can become irritated and painful.  Arthritis can also effect the facets.  By injecting steroids and local anesthetic in and around these joints, we can temporarily block the nerve supply to them.  Steroids act directly on irritated nerves and tissues to reduce selling and inflammation which often leads to decreased pain.  Facet blocks may be done anywhere along the spine from the neck to the low back depending upon the location of your pain.   After numbing the skin with local anesthetic (like Novocaine), a small needle is passed onto the facet joints under x-ray guidance.  You may experience a sensation of pressure while this is being done.  The entire block usually lasts about 15-25 minutes.   Conditions which may be treated by facet blocks:  Low back/buttock pain Neck/shoulder pain Certain types of headaches  Preparation for the injection:  Do not eat any solid food or dairy products within 8 hours of your appointment. You may drink clear liquid up to 3 hours before appointment.  Clear liquids include water, black coffee, juice or soda.  No milk or cream please. You may take your regular medication, including pain medications, with a sip of water before your appointment.  Diabetics should hold regular insulin (if taken separately) and take 1/2 normal NPH dose the morning of the procedure.  Carry some  sugar containing items with you to your appointment. A driver must accompany you and be prepared to drive you home after your procedure. Bring all your current medications with you. An IV may be inserted and sedation may be given at the discretion of the physician. A blood pressure cuff, EKG and other monitors will often be applied during the procedure.  Some patients may need to have extra oxygen administered for a short period. You will be asked to provide medical information, including your allergies and medications, prior to the procedure.  We must know immediately if you are taking blood thinners (like Coumadin/Warfarin) or if you are allergic to IV iodine contrast (dye).  We must know if you could possible be pregnant.  Possible side-effects:  Bleeding from needle site Infection (rare, may require surgery) Nerve injury (rare) Numbness & tingling (temporary) Difficulty urinating (rare, temporary) Spinal headache (a headache worse with upright posture) Light-headedness (temporary) Pain at injection site (serveral days) Decreased blood pressure (rare, temporary) Weakness in arm/leg (temporary) Pressure sensation in back/neck (temporary)   Call if you experience:  Fever/chills associated with headache or increased back/neck pain Headache worsened by an upright position New onset, weakness or numbness of an extremity below the injection site Hives or difficulty breathing (go to the emergency room) Inflammation or drainage at the injection site(s) Severe back/neck pain greater than usual New symptoms which are concerning to you  Please note:  Although the local anesthetic injected can often make your back or neck feel good for several hours after the injection, the pain will likely return. It takes 3-7 days for steroids to work.  You may not notice any pain relief for at least one week.  If effective, we will often do a series of 2-3 injections spaced 3-6 weeks apart to maximally  decrease your pain.  After the initial series, you may be a candidate for a more permanent nerve block of the facets.  If you have any questions, please call #[phone] [facility]

## 2024-12-16 LAB — COMPLIANCE DRUG ANALYSIS, UR

## 2024-12-21 ENCOUNTER — Other Ambulatory Visit: Payer: Self-pay | Admitting: Psychiatry

## 2024-12-21 DIAGNOSIS — G4700 Insomnia, unspecified: Secondary | ICD-10-CM

## 2024-12-21 DIAGNOSIS — F411 Generalized anxiety disorder: Secondary | ICD-10-CM

## 2024-12-22 ENCOUNTER — Telehealth: Payer: Self-pay | Admitting: Psychiatry

## 2024-12-22 ENCOUNTER — Telehealth: Admitting: Psychiatry

## 2024-12-22 NOTE — Telephone Encounter (Signed)
 Patient had cancelled her virtual appointment on 12-22-24. She called back to say that she is unsure of when she can reschedule medication management due to work. Also cancelled therapy appointment due to work also. States she will have to call at later time to reschedule

## 2024-12-23 ENCOUNTER — Ambulatory Visit: Admitting: Student in an Organized Health Care Education/Training Program

## 2024-12-23 ENCOUNTER — Encounter: Payer: Self-pay | Admitting: Student in an Organized Health Care Education/Training Program

## 2024-12-23 VITALS — BP 147/89 | HR 93 | Temp 98.1°F | Resp 16 | Ht 64.0 in | Wt 180.0 lb

## 2024-12-23 DIAGNOSIS — M47816 Spondylosis without myelopathy or radiculopathy, lumbar region: Secondary | ICD-10-CM | POA: Insufficient documentation

## 2024-12-23 DIAGNOSIS — M5136 Other intervertebral disc degeneration, lumbar region with discogenic back pain only: Secondary | ICD-10-CM

## 2024-12-23 DIAGNOSIS — Z0289 Encounter for other administrative examinations: Secondary | ICD-10-CM

## 2024-12-23 DIAGNOSIS — G894 Chronic pain syndrome: Secondary | ICD-10-CM | POA: Diagnosis not present

## 2024-12-23 MED ORDER — HYDROCODONE-ACETAMINOPHEN 5-325 MG PO TABS: 1.0000 | ORAL_TABLET | Freq: Two times a day (BID) | ORAL | 0 refills | Status: AC | PRN

## 2024-12-23 NOTE — Progress Notes (Signed)
 PROVIDER NOTE: Interpretation of information contained herein should be left to medically-trained personnel. Specific patient instructions are provided elsewhere under Patient Instructions section of medical record. This document was created in part using AI and STT-dictation technology, any transcriptional errors that may result from this process are unintentional.  Patient: Tracy Phillips  Service: E/M   PCP: Glendia Shad, MD  DOB: [birth year]  DOS: 12/23/2024  Provider: Wallie Sherry, MD  MRN: [id number]  Delivery: Face-to-face  Specialty: Interventional Pain Management  Type: Established Patient  Setting: Ambulatory outpatient facility  Specialty designation: 09  Referring Prov.: Glendia Shad, MD  Location: Outpatient office facility       History of present illness (HPI) [name], a 59 y.o. year old female, is here today because of her Lumbar facet arthropathy [M47.816]. [name] primary complain today is Back Pain (Lumbar left is worse )  Pertinent problems: [name] does not have any pertinent problems on file.  Pain Assessment: Severity of Chronic pain is reported as a 8 /10. Location: Back Lower, Left/denies. Onset: More than a month ago. Quality: Aching, Tightness, Discomfort, Constant. Timing: Constant. Modifying factor(s): medications, resting for a while. Vitals:  height is 5' 4 (1.626 m) and weight is 180 lb (81.6 kg). Her temporal temperature is 98.1 F (36.7 C). Her blood pressure is 147/89 (abnormal) and her pulse is 93. Her respiration is 16 and oxygen saturation is 99%.  BMI: Estimated body mass index is 30.9 kg/m as calculated from the following:   Height as of this encounter: 5' 4 (1.626 m).   Weight as of this encounter: 180 lb (81.6 kg).  Last encounter: 12/07/2024. Last procedure: Visit date not found.  Reason for encounter: 2nd pt visit  Discussed the use of AI scribe software for clinical note transcription with the patient, who gave  verbal consent to proceed.  History of Present Illness   Tracy Phillips is a 59 year old female with degenerative disc disease who presents with acute back pain after a recent injury. She was referred by an orthopedics office to a walk-in clinic for pain management.  Two days ago, she experienced acute back pain after sneezing while getting up from a chair at work, which caused her back to 'lock up' and prevented her from standing straight for a prolonged period.  She was prescribed hydrocodone  for pain management, which has been more effective than tramadol , especially on the night of the incident. She takes hydrocodone  three times a day: once in the morning, once after work, and once at bedtime, with an initial prescription of nine tablets.  She also has a sinus infection for which she was prescribed an antibiotic. She experiences diarrhea, which she attributes to the amoxicillin. No constipation with hydrocodone  use.  An x-ray was performed, and the patient was informed that there were no new fractures. She was also told that the x-ray showed degenerative disc disease at L2-L3. She has a history of chronic back pain and a previous sacral fracture. She has not undergone physical therapy for her back due to scheduling concerns, but her job involves physical activities such as sweeping, mopping, and lifting, which she equates to physical therapy.  She is concerned about the impact of her back pain on her daily activities, noting exhaustion after work and going straight to bed. She is cautious about her movements, especially in potentially hazardous conditions like icy parking lots, due to fear of falling.       HPI from  initial clinic visit: 12/23/2024 Tracy Phillips is a 59 year old female who presents for pain management consultation for chronic lower back pain.   She experiences chronic pain primarily located in her lower back, which worsens after prolonged sitting or riding in a car. The  pain is described as stiffness and does not radiate into her legs. Significant stiffness occurs upon standing after sitting for a period, making movement difficult.   Her occupation involves physical activities such as sweeping, mopping, and vacuuming, which she can manage, but she experiences increased stiffness and difficulty moving after resting post-work. She works a nine to six job with an hour lunch break and feels exhausted by the end of the day, often going straight to bed after eating.   She has a history of a sacroiliac fracture discovered during an investigation of hip issues, and she underwent a left hip replacement.   Her current medications include Celebrex , methocarbamol , and tramadol  50 mg, which she takes every eight hours. Tramadol  provides some relief, allowing her to feel 'like a normal person again,' without causing sedation or drowsiness. She is also on gabapentin  300 mg once nightly, which was reduced from six times a day, primarily for sleep issues.   She has not undergone physical therapy for her back pain and expresses concern about fitting it into her schedule due to her work commitments.   She is in communication with her pharmacist regarding starting metformin  for diabetes management, although she has not yet received the prescription.    Patient also has a history of substance abuse with cocaine and alcohol.  She states that she has been sober for almost a year now.    Pharmacotherapy Assessment   Initiate hydrocodone  5 mg 1 to 2 tablets daily as needed for severe breakthrough pain.,  Quantity 45/month Monitoring: [place] PMP: PDMP reviewed during this encounter.       Pharmacotherapy: No side-effects or adverse reactions reported. Compliance: No problems identified. Effectiveness: Clinically acceptable.  Patient to sign pain contract today. Tracy Chrissie MATSU, RN  12/23/2024  1:28 PM  Sign when Signing Visit Safety precautions to be maintained throughout the  outpatient stay will include: orient to surroundings, keep bed in low position, maintain call bell within reach at all times, provide assistance with transfer out of bed and ambulation.     UDS:  Summary  Date Value Ref Range Status  12/07/2024 FINAL  Final    Comment:    ==================================================================== Compliance Drug Analysis, Ur ==================================================================== Test                             Result       Flag       Units  Drug Present and Declared for Prescription Verification   Gabapentin                      PRESENT      EXPECTED   Methocarbamol                   PRESENT      EXPECTED   Doxepin                         PRESENT      EXPECTED   Desmethyldoxepin               PRESENT      EXPECTED    Desmethyldoxepin is  an expected metabolite of doxepin .    Venlafaxine                     PRESENT      EXPECTED   Desmethylvenlafaxine           PRESENT      EXPECTED    Desmethylvenlafaxine is an expected metabolite of venlafaxine .  Drug Absent but Declared for Prescription Verification   Tramadol                        Not Detected UNEXPECTED ng/mg creat ==================================================================== Test                      Result    Flag   Units      Ref Range   Creatinine              126              mg/dL      >=79 ==================================================================== Declared Medications:  The flagging and interpretation on this report are based on the  following declared medications.  Unexpected results may arise from  inaccuracies in the declared medications.   **Note: The testing scope of this panel includes these medications:   Doxepin  (Sinequan )  Gabapentin  (Neurontin )  Methocarbamol  (Robaxin )  Tramadol  (Ultram )  Venlafaxine  (Effexor )   **Note: The testing scope of this panel does not include the  following reported medications:   Buspirone   (Buspar ) ==================================================================== For clinical consultation, please call [phone]. ====================================================================     No results found for: CBDTHCR No results found for: D8THCCBX No results found for: D9THCCBX  ROS  Constitutional: Denies any fever or chills Gastrointestinal: No reported hemesis, hematochezia, vomiting, or acute GI distress Musculoskeletal: Low back pain Neurological: No reported episodes of acute onset apraxia, aphasia, dysarthria, agnosia, amnesia, paralysis, loss of coordination, or loss of consciousness  Medication Review  HYDROcodone -acetaminophen , amoxicillin-clavulanate, benzonatate, busPIRone , celecoxib , doxepin , gabapentin , metFORMIN , methocarbamol , promethazine -dextromethorphan, traMADol , and venlafaxine  XR  History Review  Allergy: Tracy Phillips is allergic to aleve [naproxen sodium]. Drug: Tracy Phillips  reports that she does not currently use drugs after having used the following drugs: Crack cocaine. Alcohol:  reports no history of alcohol use. Tobacco:  reports that she has never smoked. She has never used smokeless tobacco. Social: Tracy Phillips  reports that she has never smoked. She has never used smokeless tobacco. She reports that she does not currently use drugs after having used the following drugs: Crack cocaine. She reports that she does not drink alcohol. Medical:  has a past medical history of Alcohol abuse, Anemia, Anxiety, Crack cocaine use, Demand ischemia (HCC), Depression, Family history of adverse reaction to anesthesia, GERD (gastroesophageal reflux disease), Gestational diabetes, H/O dizziness, H/O eating disorder, Herpes, History of chicken pox, History of colon polyps, History of kidney stones, Hyperlipidemia, and Vitamin B12 deficiency. Surgical: Tracy Phillips  has a past surgical history that includes Abdominal hysterectomy  (11/18/1996); Tonsillectomy (11/19/1995); Dilation and curettage of uterus; Appendectomy (11/19/1995); Wrist fracture surgery (Left); Colonoscopy; and Total hip arthroplasty (Left, 03/18/2024). Family: family history includes Alcohol abuse in her maternal uncle and paternal grandfather; Arthritis in her father, maternal aunt, maternal grandfather, maternal grandmother, maternal uncle, paternal aunt, paternal grandfather, paternal grandmother, and paternal uncle; Breast cancer (age of onset: 39) in her mother; Dementia in her father; Depression in her brother, brother, maternal aunt, and mother; Diabetes in her father; Hyperlipidemia in her father, maternal aunt,  maternal grandfather, maternal grandmother, maternal uncle, mother, paternal aunt, paternal grandfather, paternal grandmother, and paternal uncle; Hypertension in her brother, father, maternal aunt, maternal grandfather, maternal grandmother, maternal uncle, mother, paternal aunt, paternal grandfather, paternal grandmother, and paternal uncle; Lung cancer in her maternal grandfather; Prostate cancer in her maternal grandfather; Stroke in her maternal grandfather; Sudden death in her paternal grandfather.  Laboratory Chemistry Profile   Renal Lab Results  Component Value Date   BUN 17 07/27/2024   CREATININE 0.77 07/27/2024   GFR 86.58 07/27/2024   GFRAA >90 01/10/2013   GFRNONAA >60 06/06/2024    Hepatic Lab Results  Component Value Date   AST 17 07/27/2024   ALT 16 07/27/2024   ALBUMIN 4.4 07/27/2024   ALKPHOS 102 07/27/2024    Electrolytes Lab Results  Component Value Date   NA 141 07/27/2024   K 4.9 07/27/2024   CL 105 07/27/2024   CALCIUM  9.4 07/27/2024   MG 2.1 01/05/2024   PHOS 2.6 04/05/2023    Bone Lab Results  Component Value Date   VD25OH 46.37 04/08/2023    Inflammation (CRP: Acute Phase) (ESR: Chronic Phase) Lab Results  Component Value Date   CRP <1.0 10/23/2023   ESRSEDRATE 7 01/02/2024         Note:  Above Lab results reviewed.  Recent Imaging Review  DG Ankle 2 Views Right EXAM: 2 VIEW(S) XRAY OF THE ANKLE 07/27/2024 12:07:01 PM  CLINICAL HISTORY: Persistent ankle pain s/p injury.  COMPARISON: None available.  FINDINGS:  BONES AND JOINTS: Sclerosis within talar dome. Mild narrowing of tibiotalar articulation. There is a well-circumscribed peripherally sclerotic cystic lesion within the anterior process of the talus where moderate degenerative changes are noted involving the talonavicular joint. This is favored to represent a benign bone lesion possibly a subchondral cyst due to degenerative change.  SOFT TISSUES: The soft tissues are unremarkable.  IMPRESSION: 1. No acute osseous abnormality. 2. Moderate degenerative changes involving the talonavicular joint. 3. Mild narrowing of the tibiotalar articulation with subchondral sclerosis along the talar dome . 4. Well-circumscribed peripherally sclerotic cystic lesion within the anterior process of the talus, possibly a subchondral cyst due to degenerative change.  Electronically signed by: Waddell Calk MD 07/27/2024 12:45 PM EDT RP Workstation: [id number]  CLINICAL DATA:  Left hip pain, low back pain and pain in left buttock. Legs feel weak. Numbness in legs and left hand.   EXAM: MRI LUMBAR SPINE WITHOUT CONTRAST   TECHNIQUE: Multiplanar, multisequence MR imaging of the lumbar spine was performed. No intravenous contrast was administered.   COMPARISON:  None Available.   FINDINGS: Segmentation: Standard.   Alignment:  No substantial sagittal subluxation.   Vertebrae: Partially imaged edema in the left sacral ala, compatible with acute/recent insufficiency fracture. No evidence of other fracture, evidence of discitis, or suspicious bone lesion.   Conus medullaris and cauda equina: Conus extends to the T12-L1 level. Conus and cauda equina appear normal.   Paraspinal and other soft tissues:  Unremarkable.   Disc levels:   T12-L1: No significant disc protrusion, foraminal stenosis, or canal stenosis.   L1-L2: No significant disc protrusion, foraminal stenosis, or canal stenosis.   L2-L3: Disc bulging and right foraminal disc protrusion. No significant canal or foraminal stenosis.   L3-L4: Disc bulging in left greater than right facet arthropathy. Resulting mild to moderate left and mild right foraminal stenosis. Patent canal.   L4-L5: Disc bulging with small central foraminal disc protrusion. Bilateral facet uncovertebral hypertrophy. Mild bilateral foraminal stenosis. Mild  canal stenosis.   L5-S1: Bilateral facet arthropathy.  No significant stenosis.   IMPRESSION: 1. Partially imaged edema in the left sacral ala, compatible with acute/recent insufficiency fracture. 2. At L3-L4, mild to moderate left and mild right foraminal stenosis. 3. At L4-L5, mild bilateral foraminal stenosis and mild canal stenosis.   These results will be called to the ordering clinician or representative by the Radiologist Assistant, and communication documented in the PACS or [employer].     Electronically Signed   By: Gilmore GORMAN Molt M.D.   On: 12/27/2023 10:44   Note: Reviewed        Physical Exam  Vitals: BP (!) 147/89 (BP Location: Right Arm, Patient Position: Sitting, Cuff Size: Normal)   Pulse 93   Temp 98.1 F (36.7 C) (Temporal)   Resp 16   Ht 5' 4 (1.626 m)   Wt 180 lb (81.6 kg)   SpO2 99%   BMI 30.90 kg/m  BMI: Estimated body mass index is 30.9 kg/m as calculated from the following:   Height as of this encounter: 5' 4 (1.626 m).   Weight as of this encounter: 180 lb (81.6 kg). Ideal: Ideal body weight: 54.7 kg (120 lb 9.5 oz) Adjusted ideal body weight: 65.5 kg (144 lb 5.7 oz) General appearance: Well nourished, well developed, and well hydrated. In no apparent acute distress Mental status: Alert, oriented x 3 (person, place, & time)        Respiratory: No evidence of acute respiratory distress Eyes: PERLA  Lumbar Spine Area Exam  Skin & Axial Inspection: No masses, redness, or swelling Alignment: Symmetrical Functional ROM: Pain restricted ROM affecting both sides Stability: No instability detected Muscle Tone/Strength: Functionally intact. No obvious neuro-muscular anomalies detected. Sensory (Neurological): Musculoskeletal pain pattern Palpation: No palpable anomalies       Provocative Tests: Hyperextension/rotation test: deferred today       Lumbar quadrant test (Kemp's test): (+) bilaterally for facet joint pain.             Gait & Posture Assessment  Ambulation: Unassisted Gait: Relatively normal for age and body habitus Posture: WNL  Lower Extremity Exam      Side: Right lower extremity   Side: Left lower extremity  Stability: No instability observed           Stability: No instability observed          Skin & Extremity Inspection: Skin color, temperature, and hair growth are WNL. No peripheral edema or cyanosis. No masses, redness, swelling, asymmetry, or associated skin lesions. No contractures.   Skin & Extremity Inspection: Skin color, temperature, and hair growth are WNL. No peripheral edema or cyanosis. No masses, redness, swelling, asymmetry, or associated skin lesions. No contractures.  Functional ROM: Unrestricted ROM                   Functional ROM: Unrestricted ROM                  Muscle Tone/Strength: Functionally intact. No obvious neuro-muscular anomalies detected.   Muscle Tone/Strength: Functionally intact. No obvious neuro-muscular anomalies detected.  Sensory (Neurological): Unimpaired         Sensory (Neurological): Unimpaired        DTR: Patellar: deferred today Achilles: deferred today Plantar: deferred today   DTR: Patellar: deferred today Achilles: deferred today Plantar: deferred today  Palpation: No palpable anomalies   Palpation: No palpable anomalies     Assessment    Diagnosis  1. Lumbar  facet arthropathy   2. Lumbar spondylosis   3. Pain management contract signed   4. Degeneration of intervertebral disc of lumbar region with discogenic back pain   5. Chronic pain syndrome      Updated Problems: Problem  Lumbar Facet Arthropathy  Lumbar Spondylosis  Pain Management Contract Signed    Plan of Care  Problem-specific:  Assessment and Plan    Degenerative disc disease of lumbar spine with discogenic back pain   A recent exacerbation of back pain occurred due to sneezing, increasing intra-abdominal pressure. X-ray confirmed degenerative disc disease at L2-L3 without new fractures. Pain is primarily muscular and higher than previous episodes. Hydrocodone  is more effective than tramadol  for pain relief, with no constipation reported. MRI is deferred due to financial concerns. Prescribe hydrocodone  5 mg, 1-2 tablets daily as needed, with 45 tablets for the month. She signed a controlled substance agreement. Provide a home exercise program for lumbar stretching. Schedule follow-up in four weeks for medication management.  Chronic pain syndrome   Pain is managed with hydrocodone  (1st Rx today from [facility], contract signed), Celebrex , and methocarbamol , aiming to provide relief after a long day and ensure restful sleep. Continue current pain management regimen. Schedule monthly visits for the first three to six months to monitor compliance and adjust treatment as needed.   Lumbar facet arthropathy: Tracy Phillips has a history of greater than 3 months of moderate to severe pain which is resulted in functional impairment.  The patient has tried various conservative therapeutic options such as NSAIDs, Tylenol , muscle relaxants, physical therapy which was inadequately effective.  Patient's pain is predominantly axial with physical exam findings suggestive of facet arthropathy. Lumbar facet medial branch nerve blocks were discussed with the patient.  Risks  and benefits were reviewed.  Try home based exercised program that we developed today for lumbar spine ROM and strengthening. Will revisit bilateral L3, L4, L5 medial branch nerve block after home based exercise program.       Tracy Phillips has a current medication list which includes the following long-term medication(s): metformin , venlafaxine  xr, doxepin , and gabapentin .  Pharmacotherapy (Medications Ordered): Meds ordered this encounter  Medications   HYDROcodone -acetaminophen  (NORCO/VICODIN) 5-325 MG tablet    Sig: Take 1-2 tablets by mouth every 12 (twelve) hours as needed. Must last 30 days    Dispense:  45 tablet    Refill:  0   Orders:  No orders of the defined types were placed in this encounter.   Return in about 4 weeks (around 01/20/2025) for MM, F2F, BL (discuss facets).    Recent Visits Date Type Provider Dept  12/07/24 Office Visit Marcelino Nurse, MD [facility]  Showing recent visits within past 90 days and meeting all other requirements Today's Visits Date Type Provider Dept  12/23/24 Office Visit Marcelino Nurse, MD [facility]  Showing today's visits and meeting all other requirements Future Appointments Date Type Provider Dept  01/20/25 Appointment Marcelino Nurse, MD [facility]  Showing future appointments within next 90 days and meeting all other requirements  I discussed the assessment and treatment plan with the patient. The patient was provided an opportunity to ask questions and all were answered. The patient agreed with the plan and demonstrated an understanding of the instructions.  Patient advised to call back or seek an in-person evaluation if the symptoms or condition worsens. I personally spent a total of 30 minutes in the care of the patient today including preparing  to see the patient, getting/reviewing separately obtained history, performing a medically appropriate exam/evaluation, counseling and educating,  placing orders, and documenting clinical information in the EHR.   Note by: Wallie Sherry, MD (TTS and AI technology used. I apologize for any typographical errors that were not detected and corrected.) Date: 12/23/2024; Time: 2:27 PM

## 2024-12-23 NOTE — Progress Notes (Signed)
 Safety precautions to be maintained throughout the outpatient stay will include: orient to surroundings, keep bed in low position, maintain call bell within reach at all times, provide assistance with transfer out of bed and ambulation.

## 2024-12-23 NOTE — Patient Instructions (Addendum)
 Sign pain contract Home exercise program- research Youtube for lumbar spinal exercises and stretching

## 2024-12-24 ENCOUNTER — Telehealth: Payer: Self-pay | Admitting: Student in an Organized Health Care Education/Training Program

## 2024-12-24 NOTE — Telephone Encounter (Signed)
Attempted to reach patient, voicemail left. 

## 2024-12-24 NOTE — Telephone Encounter (Signed)
 Pt wanted to talk to nurse about her meds that Dr. Marcelino is taking over.

## 2024-12-28 ENCOUNTER — Other Ambulatory Visit

## 2024-12-28 ENCOUNTER — Ambulatory Visit: Admitting: Professional Counselor

## 2025-01-11 ENCOUNTER — Other Ambulatory Visit

## 2025-01-20 ENCOUNTER — Encounter: Admitting: Student in an Organized Health Care Education/Training Program
